# Patient Record
Sex: Female | Born: 1955 | Race: Black or African American | Hispanic: No | Marital: Single | Smoking: Never smoker
Health system: Southern US, Community
[De-identification: ages and names within clinical notes are randomized; demographics above are authoritative.]

## PROBLEM LIST (undated history)

## (undated) DIAGNOSIS — D649 Anemia, unspecified: Secondary | ICD-10-CM

## (undated) DIAGNOSIS — I10 Essential (primary) hypertension: Secondary | ICD-10-CM

## (undated) DIAGNOSIS — M7730 Calcaneal spur, unspecified foot: Secondary | ICD-10-CM

## (undated) DIAGNOSIS — M1990 Unspecified osteoarthritis, unspecified site: Secondary | ICD-10-CM

## (undated) DIAGNOSIS — R011 Cardiac murmur, unspecified: Secondary | ICD-10-CM

## (undated) DIAGNOSIS — I8390 Asymptomatic varicose veins of unspecified lower extremity: Secondary | ICD-10-CM

## (undated) HISTORY — DX: Essential (primary) hypertension: I10

## (undated) HISTORY — DX: Asymptomatic varicose veins of unspecified lower extremity: I83.90

## (undated) HISTORY — PX: FOOT SURGERY: SHX648

## (undated) HISTORY — DX: Unspecified osteoarthritis, unspecified site: M19.90

## (undated) HISTORY — DX: Calcaneal spur, unspecified foot: M77.30

## (undated) HISTORY — PX: ABDOMINAL HYSTERECTOMY: SHX81

---

## 2007-11-18 ENCOUNTER — Ambulatory Visit (HOSPITAL_COMMUNITY): Admission: RE | Admit: 2007-11-18 | Discharge: 2007-11-18 | Payer: Self-pay | Admitting: Family Medicine

## 2010-02-19 ENCOUNTER — Other Ambulatory Visit: Payer: Self-pay

## 2010-02-19 ENCOUNTER — Other Ambulatory Visit (HOSPITAL_COMMUNITY): Payer: Self-pay | Admitting: *Deleted

## 2010-02-19 DIAGNOSIS — Z09 Encounter for follow-up examination after completed treatment for conditions other than malignant neoplasm: Secondary | ICD-10-CM

## 2010-02-26 ENCOUNTER — Ambulatory Visit (HOSPITAL_COMMUNITY)
Admission: RE | Admit: 2010-02-26 | Discharge: 2010-02-26 | Disposition: A | Discharge status: Home / Self Care | Payer: 59 | Source: Ambulatory Visit | Attending: Family Medicine | Admitting: Family Medicine

## 2010-02-26 DIAGNOSIS — Z09 Encounter for follow-up examination after completed treatment for conditions other than malignant neoplasm: Secondary | ICD-10-CM

## 2010-02-26 DIAGNOSIS — N6489 Other specified disorders of breast: Secondary | ICD-10-CM | POA: Insufficient documentation

## 2010-05-08 ENCOUNTER — Other Ambulatory Visit (HOSPITAL_COMMUNITY): Payer: Self-pay | Admitting: Family Medicine

## 2010-05-16 ENCOUNTER — Other Ambulatory Visit (HOSPITAL_COMMUNITY): Payer: Self-pay | Admitting: Family Medicine

## 2010-05-16 DIAGNOSIS — Z09 Encounter for follow-up examination after completed treatment for conditions other than malignant neoplasm: Secondary | ICD-10-CM

## 2011-02-02 ENCOUNTER — Other Ambulatory Visit (HOSPITAL_COMMUNITY): Payer: Self-pay | Admitting: Family Medicine

## 2011-02-02 DIAGNOSIS — Z139 Encounter for screening, unspecified: Secondary | ICD-10-CM

## 2011-03-02 ENCOUNTER — Ambulatory Visit (HOSPITAL_COMMUNITY)
Admission: RE | Admit: 2011-03-02 | Discharge: 2011-03-02 | Disposition: A | Discharge status: Home / Self Care | Payer: 59 | Source: Ambulatory Visit | Attending: Family Medicine | Admitting: Family Medicine

## 2011-03-02 DIAGNOSIS — Z139 Encounter for screening, unspecified: Secondary | ICD-10-CM

## 2011-03-02 DIAGNOSIS — Z1231 Encounter for screening mammogram for malignant neoplasm of breast: Secondary | ICD-10-CM | POA: Insufficient documentation

## 2011-05-20 ENCOUNTER — Ambulatory Visit (INDEPENDENT_AMBULATORY_CARE_PROVIDER_SITE_OTHER): Payer: 59 | Admitting: Family Medicine

## 2011-05-20 ENCOUNTER — Encounter: Payer: Self-pay | Admitting: Family Medicine

## 2011-05-20 VITALS — BP 134/80 | HR 69 | Resp 18 | Ht 68.0 in | Wt 193.0 lb

## 2011-05-20 DIAGNOSIS — Z6825 Body mass index (BMI) 25.0-25.9, adult: Secondary | ICD-10-CM

## 2011-05-20 DIAGNOSIS — I10 Essential (primary) hypertension: Secondary | ICD-10-CM

## 2011-05-20 DIAGNOSIS — M1990 Unspecified osteoarthritis, unspecified site: Secondary | ICD-10-CM

## 2011-05-20 DIAGNOSIS — E669 Obesity, unspecified: Secondary | ICD-10-CM | POA: Insufficient documentation

## 2011-05-20 DIAGNOSIS — I83892 Varicose veins of left lower extremities with other complications: Secondary | ICD-10-CM | POA: Insufficient documentation

## 2011-05-20 DIAGNOSIS — I8390 Asymptomatic varicose veins of unspecified lower extremity: Secondary | ICD-10-CM

## 2011-05-20 DIAGNOSIS — E663 Overweight: Secondary | ICD-10-CM

## 2011-05-20 DIAGNOSIS — I83893 Varicose veins of bilateral lower extremities with other complications: Secondary | ICD-10-CM | POA: Insufficient documentation

## 2011-05-20 MED ORDER — TRAMADOL HCL 50 MG PO TABS: 50.0000 mg | ORAL_TABLET | Freq: Two times a day (BID) | ORAL | Status: DC | PRN

## 2011-05-20 MED ORDER — AMOXICILLIN 500 MG PO CAPS: 500.0000 mg | ORAL_CAPSULE | Freq: Three times a day (TID) | ORAL | Status: DC

## 2011-05-20 MED ORDER — IBUPROFEN 800 MG PO TABS: 800.0000 mg | ORAL_TABLET | Freq: Three times a day (TID) | ORAL | Status: AC | PRN

## 2011-05-20 NOTE — Assessment & Plan Note (Signed)
Continue current meds, obtain records, at goal

## 2011-05-20 NOTE — Progress Notes (Signed)
  Subjective:    Patient ID: Betty Dawson, female    DOB: 02/04/1955, 56 y.o.   MRN: [id number]  HPI Pt here to establish care, previous PCP [facility] Medications and History reviewed [facility] [place] HTN- well controlled on current meds, has been seen by PCP within the past 6 months OA- has severe pain in feet and ankles, has bad varicose veins as well as arthritis in feet. Has had surgery on foot for bone spurs in heels. Takes Advil as needed but this does not help much. Has tried compression hose for her veins but uncomfortable to work in. Tooth pain- has appt with dentist this Friday to have teeth pulled, on antibiotics currently  No colonoscopy,no dexa scan Mammogram UTD     Review of Systems    GEN- denies fatigue, fever, weight loss,weakness, recent illness HEENT- denies eye drainage, change in vision, nasal discharge, CVS- denies chest pain, palpitations, +leg swelling RESP- denies SOB, cough, wheeze ABD- denies N/V, change in stools, abd pain GU- denies dysuria, hematuria, dribbling, incontinence MSK- + joint pain, muscle aches, injury Neuro- denies headache, dizziness, syncope, seizure activity      Objective:   Physical Exam GEN- NAD, alert and oriented x3 HEENT- PERRL, EOMI, non injected sclera, pink conjunctiva, MMM, oropharynx clear Neck- Supple, no thryomegaly CVS- RRR, 2/6 SEM RESP-CTAB ABD-NABS,soft, ND, ND EXT- Trace pedal edema, multiple varicose veins noted Pulses- Radial, DP- 2+        Assessment & Plan:

## 2011-05-20 NOTE — Patient Instructions (Signed)
I will get records from your previous doctor Use the ultram for arthritis pain Continue your other medications Do not use ibuprofen, and advil at the same time F/u 2 months

## 2011-05-20 NOTE — Assessment & Plan Note (Signed)
Trial of ultram 

## 2011-05-27 ENCOUNTER — Telehealth: Payer: Self-pay | Admitting: Family Medicine

## 2011-05-28 MED ORDER — AMLODIPINE BESYLATE 5 MG PO TABS: 5.0000 mg | ORAL_TABLET | Freq: Every day | ORAL | Status: DC

## 2011-05-28 MED ORDER — HYDROCHLOROTHIAZIDE 25 MG PO TABS: 25.0000 mg | ORAL_TABLET | Freq: Every day | ORAL | Status: DC

## 2011-05-28 NOTE — Telephone Encounter (Signed)
meds sent as requested 

## 2011-06-02 ENCOUNTER — Ambulatory Visit (INDEPENDENT_AMBULATORY_CARE_PROVIDER_SITE_OTHER): Payer: 59 | Admitting: Family Medicine

## 2011-06-02 ENCOUNTER — Encounter: Payer: Self-pay | Admitting: Family Medicine

## 2011-06-02 VITALS — BP 128/80 | HR 58 | Resp 18 | Ht 68.0 in | Wt 189.0 lb

## 2011-06-02 DIAGNOSIS — M25579 Pain in unspecified ankle and joints of unspecified foot: Secondary | ICD-10-CM

## 2011-06-02 DIAGNOSIS — G8929 Other chronic pain: Secondary | ICD-10-CM

## 2011-06-02 DIAGNOSIS — M1990 Unspecified osteoarthritis, unspecified site: Secondary | ICD-10-CM

## 2011-06-02 MED ORDER — HYDROCODONE-ACETAMINOPHEN 5-500 MG PO TABS: 1.0000 | ORAL_TABLET | Freq: Four times a day (QID) | ORAL | Status: DC | PRN

## 2011-06-02 NOTE — Assessment & Plan Note (Signed)
Chronic bilat ankle pain, will place in her stabilizing braces, she has seen many specialist before and has had injections and nothing has helped. I think today is worse secondary to her abnormal shift and increased activity,given hydrocodone for severe pain only. On NSAID for tooth

## 2011-06-02 NOTE — Assessment & Plan Note (Signed)
Continue ultram daily for OA

## 2011-06-02 NOTE — Progress Notes (Signed)
  Subjective:    Patient ID: Betty Dawson, female    DOB: 1955/06/22, 56 y.o.   MRN: [id number]  HPI  Patient here secondary to bilateral foot and ankle pain. She had a long shift and was hoping to trying another individual and she was up for 12 hours walking more than normal. Since then she's had increased pain in her ankles and feet bilaterally. She also has some swelling in the ankles. She's had this pain many times before or after too much activity. She tried her ultram but this did not help very much.  Review of Systems     GEN- denies fatigue, fever, weight loss,weakness, recent illness CVS- denies chest pain, palpitations, +leg swelling MSK- + joint pain, muscle aches, injury      Objective:   Physical Exam GEN- NAD, alert and oriented x3 MSK- normal inspection bilat feet and ankles besides varicosities, TTP medial malleous bilat., fair ROM bilat ankles with discomfort,decreased ROM with lateral movements,neg squeeze test, normal thompson test,no calf pain EXT- Trace pedal edema, multiple varicose veins noted Pulses- Radial, DP- 2+ Antaglic gait       Assessment & Plan:

## 2011-06-02 NOTE — Patient Instructions (Addendum)
Where the braces on your feet for the next week , take off if just sitting around Continue the ultram you can take 1-2 tablets ( for arthritis pain) as needed  For severe pain use the vicodin Keep previous F/U appt

## 2011-07-20 ENCOUNTER — Ambulatory Visit (INDEPENDENT_AMBULATORY_CARE_PROVIDER_SITE_OTHER): Payer: 59 | Admitting: Family Medicine

## 2011-07-20 ENCOUNTER — Encounter: Payer: Self-pay | Admitting: Family Medicine

## 2011-07-20 VITALS — BP 130/80 | HR 79 | Resp 16 | Ht 68.0 in | Wt 191.0 lb

## 2011-07-20 DIAGNOSIS — M1990 Unspecified osteoarthritis, unspecified site: Secondary | ICD-10-CM

## 2011-07-20 DIAGNOSIS — Z1211 Encounter for screening for malignant neoplasm of colon: Secondary | ICD-10-CM

## 2011-07-20 DIAGNOSIS — E663 Overweight: Secondary | ICD-10-CM

## 2011-07-20 DIAGNOSIS — Z6825 Body mass index (BMI) 25.0-25.9, adult: Secondary | ICD-10-CM

## 2011-07-20 DIAGNOSIS — R12 Heartburn: Secondary | ICD-10-CM

## 2011-07-20 DIAGNOSIS — R197 Diarrhea, unspecified: Secondary | ICD-10-CM

## 2011-07-20 DIAGNOSIS — I10 Essential (primary) hypertension: Secondary | ICD-10-CM

## 2011-07-20 MED ORDER — OMEPRAZOLE 20 MG PO CPDR: 20.0000 mg | DELAYED_RELEASE_CAPSULE | Freq: Every day | ORAL | Status: DC

## 2011-07-20 NOTE — Patient Instructions (Addendum)
I will refer you for a colonoscopy  Get the blood work done fasting Continue current meds Start the omeprazole for your acid reflux Try to break the vicodin tablet in half  F/U 4 months

## 2011-07-20 NOTE — Progress Notes (Signed)
  Subjective:    Patient ID: Betty Dawson, female    DOB: 03/10/1955, 57 y.o.   MRN: [id number]  HPI Patient presents for routine followup. She continues to have leg pain which is chronic in nature she also has history of arthritis in her hip and has had epidural injections in steroid injections in the past. She tried Lortab however this gave her a headache she continues to use the Ultram. She admits to mild diarrhea for the past 2 days denies fever, nausea, vomiting, blood in stool. She also has had increased reflux with different foods that she eats. She is due for colonoscopy. Also due for blood work.   Review of Systems - per above   GEN- denies fatigue, fever, weight loss,weakness, recent illness HEENT- denies eye drainage, change in vision, nasal discharge, CVS- denies chest pain, palpitations RESP- denies SOB, cough, wheeze ABD- denies N/V, change in stools, abd pain GU- denies dysuria, hematuria, dribbling, incontinence MSK- + joint pain, muscle aches, injury Neuro- denies headache, dizziness, syncope, seizure activity      Objective:   Physical Exam GEN- NAD, alert and oriented x3 HEENT- PERRL, EOMI, non injected sclera, pink conjunctiva, MMM, oropharynx clear Neck-supple CVS- RRR, no murmur RESP-CTAB ABD-NABS,soft,NT,ND,no suprapubic tenderness EXT- Trace pedal edema, multiple varicose veins noted Pulses- Radial, DP- 2+ Antaglic gait        Assessment & Plan:

## 2011-07-21 DIAGNOSIS — R12 Heartburn: Secondary | ICD-10-CM | POA: Insufficient documentation

## 2011-07-21 LAB — COMPREHENSIVE METABOLIC PANEL
BUN: 16 mg/dL (ref 6–23)
CO2: 32 mEq/L (ref 19–32)
Calcium: 9.2 mg/dL (ref 8.4–10.5)
Sodium: 140 mEq/L (ref 135–145)

## 2011-07-21 LAB — CBC WITH DIFFERENTIAL/PLATELET
Basophils Absolute: 0 10*3/uL (ref 0.0–0.1)
Basophils Relative: 0 % (ref 0–1)
Eosinophils Absolute: 0.1 10*3/uL (ref 0.0–0.7)
Eosinophils Relative: 2 % (ref 0–5)
Hemoglobin: 10.6 g/dL — ABNORMAL LOW (ref 12.0–15.0)
MCHC: 32.8 g/dL (ref 30.0–36.0)
MCV: 82.4 fL (ref 78.0–100.0)
Neutro Abs: 4.2 10*3/uL (ref 1.7–7.7)
Platelets: 269 10*3/uL (ref 150–400)
RBC: 3.92 MIL/uL (ref 3.87–5.11)

## 2011-07-21 LAB — LIPID PANEL
Cholesterol: 132 mg/dL (ref 0–200)
LDL Cholesterol: 64 mg/dL (ref 0–99)
VLDL: 9 mg/dL (ref 0–40)

## 2011-07-21 NOTE — Assessment & Plan Note (Signed)
Trial of PPI. 

## 2011-07-21 NOTE — Assessment & Plan Note (Signed)
Continue ultram, try 1/2 tablet of hydrocodone

## 2011-07-21 NOTE — Assessment & Plan Note (Signed)
Acute diarrhea, not ill appearing, supportive care, send for colonoscopy Labs per above

## 2011-07-21 NOTE — Assessment & Plan Note (Signed)
unchanged

## 2011-07-21 NOTE — Assessment & Plan Note (Signed)
Well controlled no change to meds 

## 2011-07-22 ENCOUNTER — Encounter (INDEPENDENT_AMBULATORY_CARE_PROVIDER_SITE_OTHER): Payer: Self-pay | Admitting: *Deleted

## 2011-10-26 ENCOUNTER — Ambulatory Visit (INDEPENDENT_AMBULATORY_CARE_PROVIDER_SITE_OTHER): Payer: 59 | Admitting: Family Medicine

## 2011-10-26 ENCOUNTER — Encounter: Payer: Self-pay | Admitting: Family Medicine

## 2011-10-26 VITALS — BP 148/90 | HR 61 | Resp 15 | Ht 68.0 in | Wt 192.0 lb

## 2011-10-26 DIAGNOSIS — M1990 Unspecified osteoarthritis, unspecified site: Secondary | ICD-10-CM

## 2011-10-26 DIAGNOSIS — I10 Essential (primary) hypertension: Secondary | ICD-10-CM

## 2011-10-26 DIAGNOSIS — I8390 Asymptomatic varicose veins of unspecified lower extremity: Secondary | ICD-10-CM

## 2011-10-26 MED ORDER — HYDROCODONE-ACETAMINOPHEN 5-500 MG PO TABS: 1.0000 | ORAL_TABLET | Freq: Four times a day (QID) | ORAL | Status: DC | PRN

## 2011-10-26 MED ORDER — HYDROCHLOROTHIAZIDE 25 MG PO TABS: 25.0000 mg | ORAL_TABLET | Freq: Every day | ORAL | Status: DC

## 2011-10-26 MED ORDER — TRAMADOL HCL 50 MG PO TABS: 50.0000 mg | ORAL_TABLET | Freq: Two times a day (BID) | ORAL | Status: AC | PRN

## 2011-10-26 MED ORDER — OMEPRAZOLE 20 MG PO CPDR: 20.0000 mg | DELAYED_RELEASE_CAPSULE | Freq: Every day | ORAL | Status: DC

## 2011-10-26 MED ORDER — AMLODIPINE BESYLATE 5 MG PO TABS: 5.0000 mg | ORAL_TABLET | Freq: Every day | ORAL | Status: DC

## 2011-10-26 NOTE — Assessment & Plan Note (Signed)
Restart BP medications

## 2011-10-26 NOTE — Progress Notes (Signed)
  Subjective:    Patient ID: Betty Dawson, female    DOB: 01/23/1956, 57 y.o.   MRN: [id number]  HPI Patient here to follow chronic medical problems. She's concerned about swelling to that she has on her right shin in her left ankle she noticed these the past few weeks. She continues to have increased pain with walking and continues to have swelling at the end of the day even if she is wearing support hose or  stockings. She's not had her blood pressure medication one week. She is unable to a colonoscopy right now because her insurance company told her it'll be $900 out of pocket. She's going to follow with her insurance to see why things not being covered Medications reviewed Declined flu shot  Review of Systems GEN- denies fatigue, fever, weight loss,weakness, recent illness HEENT- denies eye drainage, change in vision, nasal discharge, CVS- denies chest pain, palpitations,+ leg sweelling RESP- denies SOB, cough, wheeze ABD- denies N/V, change in stools, abd pain GU- denies dysuria, hematuria, dribbling, incontinence MSK- +joint pain, muscle aches, injury Neuro- denies headache, dizziness, syncope, seizure activity      Objective:   Physical Exam GEN- NAD, alert and oriented x3 HEENT- PERRL, EOMI, non injected sclera, pink conjunctiva, MMM, oropharynx clear Neck-supple CVS- RRR, no murmur RESP-CTAB ABD-NABS,soft,NT,ND,no suprapubic tenderness EXT- Trace pedal edema, multiple varicose veins noted, varicose vein/?swelling palpated on upper right shin and left medial aspect of foot near ankle Pulses- Radial, DP- 2+          Assessment & Plan:

## 2011-10-26 NOTE — Assessment & Plan Note (Signed)
Continue ultram and vicodin as needed

## 2011-10-26 NOTE — Assessment & Plan Note (Signed)
I believe her pains are worsening and she has a lot of fatigue in her legs secondary to the swelling in the veins. She has been evaluated by vascular a few years ago. I believe with her current symptoms she needs reevaluation for any possible treatment

## 2011-10-26 NOTE — Patient Instructions (Addendum)
Continue compression hose Vascular appt for the legs and veins - appointment for morning Restart the blood pressure pills Let us know if colonoscopy can be covered  F/U 4 months

## 2011-11-02 ENCOUNTER — Other Ambulatory Visit: Payer: Self-pay

## 2011-11-02 DIAGNOSIS — M7989 Other specified soft tissue disorders: Secondary | ICD-10-CM

## 2011-11-27 ENCOUNTER — Encounter: Payer: Self-pay | Admitting: Vascular Surgery

## 2011-11-30 ENCOUNTER — Encounter (INDEPENDENT_AMBULATORY_CARE_PROVIDER_SITE_OTHER): Payer: 59 | Admitting: *Deleted

## 2011-11-30 ENCOUNTER — Ambulatory Visit (INDEPENDENT_AMBULATORY_CARE_PROVIDER_SITE_OTHER): Payer: 59 | Admitting: Vascular Surgery

## 2011-11-30 ENCOUNTER — Encounter: Payer: Self-pay | Admitting: Vascular Surgery

## 2011-11-30 VITALS — BP 170/88 | HR 62 | Resp 18 | Ht 68.0 in | Wt 194.0 lb

## 2011-11-30 DIAGNOSIS — I83893 Varicose veins of bilateral lower extremities with other complications: Secondary | ICD-10-CM

## 2011-11-30 DIAGNOSIS — M7989 Other specified soft tissue disorders: Secondary | ICD-10-CM

## 2011-11-30 DIAGNOSIS — M79609 Pain in unspecified limb: Secondary | ICD-10-CM

## 2011-11-30 DIAGNOSIS — R609 Edema, unspecified: Secondary | ICD-10-CM

## 2011-11-30 NOTE — Progress Notes (Signed)
Subjective:     Patient ID: Betty Dawson, female   DOB: 09/01/1955, 56 y.o.   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 She has had 2 or 3 episodes of spontaneous bleeding from prominent veins in the right ankle area. She has no history of stasis ulcers of DVT but had superficial phlebitis in both lower extremities many years ago. She developed aching throbbing and burning discomfort in both legs particularly in the ankle and calf areas as the day progresses. She is not wear elastic compression stockings and is unable to elevate her legs at work. She does not take pain medication.  Past Medical History  Diagnosis Date  . Hypertension   . Heel spur   . Arthritis     OA multiple joints  . Varicose veins     History  Substance Use Topics  . Smoking status: Never Smoker   . Smokeless tobacco: Never Used  . Alcohol Use: No    Family History  Problem Relation Age of Onset  . Hypertension Mother   . Cancer Mother   . Diabetes Sister   . Hyperlipidemia Sister   . Hypertension Sister   . Diabetes Brother   . Hypertension Brother     No Known Allergies  Current outpatient prescriptions:amLODipine (NORVASC) 5 MG tablet, Take 1 tablet (5 mg total) by mouth daily., Disp: 30 tablet, Rfl: 5;  hydrochlorothiazide (HYDRODIURIL) 25 MG tablet, Take 1 tablet (25 mg total) by mouth daily., Disp: 30 tablet, Rfl: 5;  HYDROcodone-acetaminophen (VICODIN) 5-500 MG per tablet, Take 1 tablet by mouth every 6 (six) hours as needed for pain., Disp: 40 tablet, Rfl: 0 omeprazole (PRILOSEC) 20 MG capsule, Take 1 capsule (20 mg total) by mouth daily., Disp: 30 capsule, Rfl: 5;  traMADol (ULTRAM) 50 MG tablet, Take 1 tablet (50 mg total) by mouth 2 (two) times daily as needed for pain., Disp: 60 tablet, Rfl: 3  BP 170/88  Pulse 62  Resp 18  Ht 5\' 8"  (1.727 m)  Wt 194 lb (87.998 kg)   BMI 29.50 kg/m2  Body mass index is 29.50 kg/(m^2).         Review of Systems denies chest pain but does complain of dyspnea on exertion skin rashes, diffuse arthritis in her ankles and knees, but no lateralizing weakness, aphasia, amaurosis fugax, or syncope     Objective:   Physical Exam blood pressure 170/88 heart rate 60 respirations 18 Gen.-alert and oriented x3 in no apparent distress HEENT normal for age Lungs no rhonchi or wheezing Cardiovascular regular rhythm no murmurs carotid pulses 3+ palpable no bruits audible Abdomen soft nontender no palpable masses Musculoskeletal free of  major deformities Skin clear -no rashes-diffuse spider and reticular veins in both lower extremities on the medial and lateral thighs and medial and lateral calf particularly in the ankle areas noted the medial malleolus Neurologic normal Lower extremities 3+ femoral and dorsalis pedis pulses palpable bilaterally with 1+ edema bilaterally. Right leg has significant bulging varicosities in the medial calf and posterior calf up to the popliteal fossa. There is early hyperpigmentation in the right medial lower leg near the ankle where the previous spontaneous bleeding occurred.  Today I ordered bilateral venous duplex exam which I reviewed and interpreted. Right leg has reflux in the great saphenous vein down to the mid thigh although the caliber is not large. The right small  saphenous vein is a large vein with gross reflux is supplying these varicosities in the calf. Left leg has reflux in certain areas in the superficial venous system of caliber is small and there is no DVT in either leg.       Assessment:     Severe venous insufficiency right leg with gross reflux right small saphenous vein history of spontaneous bleeding on 2-3 occasions. Painful varicosities affecting patient's living and and ability to work    Plan:     #1 long-leg elastic compression stockings 20-30 mm gradient #2 elevate  legs as much is possible-unable to do this during work #3 ibuprofen on daily basis #4 return in 3 months. If no significant improvement would need #1 laser ablation right small saphenous vein with 10-20 stab phlebectomy followed by #2 one core sclerotherapy right ankle where previous bleeding occurred

## 2012-01-03 ENCOUNTER — Emergency Department (HOSPITAL_COMMUNITY): Payer: 59

## 2012-01-03 ENCOUNTER — Emergency Department (HOSPITAL_COMMUNITY)
Admission: EM | Admit: 2012-01-03 | Discharge: 2012-01-03 | Disposition: A | Discharge status: Home / Self Care | Payer: 59 | Attending: Emergency Medicine | Admitting: Emergency Medicine

## 2012-01-03 ENCOUNTER — Encounter (HOSPITAL_COMMUNITY): Payer: Self-pay | Admitting: *Deleted

## 2012-01-03 DIAGNOSIS — Z79899 Other long term (current) drug therapy: Secondary | ICD-10-CM | POA: Insufficient documentation

## 2012-01-03 DIAGNOSIS — R52 Pain, unspecified: Secondary | ICD-10-CM | POA: Insufficient documentation

## 2012-01-03 DIAGNOSIS — J111 Influenza due to unidentified influenza virus with other respiratory manifestations: Secondary | ICD-10-CM

## 2012-01-03 DIAGNOSIS — J159 Unspecified bacterial pneumonia: Secondary | ICD-10-CM | POA: Insufficient documentation

## 2012-01-03 DIAGNOSIS — Z8679 Personal history of other diseases of the circulatory system: Secondary | ICD-10-CM | POA: Insufficient documentation

## 2012-01-03 DIAGNOSIS — Z8739 Personal history of other diseases of the musculoskeletal system and connective tissue: Secondary | ICD-10-CM | POA: Insufficient documentation

## 2012-01-03 DIAGNOSIS — I10 Essential (primary) hypertension: Secondary | ICD-10-CM | POA: Insufficient documentation

## 2012-01-03 DIAGNOSIS — J189 Pneumonia, unspecified organism: Secondary | ICD-10-CM

## 2012-01-03 DIAGNOSIS — R509 Fever, unspecified: Secondary | ICD-10-CM | POA: Insufficient documentation

## 2012-01-03 MED ORDER — LIDOCAINE HCL (PF) 1 % IJ SOLN
INTRAMUSCULAR | Status: AC
  Administered 2012-01-03: 15:00:00 via INTRAMUSCULAR
  Filled 2012-01-03: qty 5

## 2012-01-03 MED ORDER — OSELTAMIVIR PHOSPHATE 75 MG PO CAPS: 75.0000 mg | ORAL_CAPSULE | Freq: Two times a day (BID) | ORAL | Status: DC

## 2012-01-03 MED ORDER — CEFTRIAXONE SODIUM 1 G IJ SOLR
1.0000 g | Freq: Once | INTRAMUSCULAR | Status: AC
Start: 2012-01-03 — End: 2012-01-03
  Administered 2012-01-03: 1 g via INTRAMUSCULAR
  Filled 2012-01-03: qty 10

## 2012-01-03 MED ORDER — LEVOFLOXACIN 750 MG PO TABS: 750.0000 mg | ORAL_TABLET | Freq: Every day | ORAL | Status: DC

## 2012-01-03 MED ORDER — MUCINEX DM MAXIMUM STRENGTH 60-1200 MG PO TB12: 1.0000 | ORAL_TABLET | Freq: Two times a day (BID) | ORAL | Status: DC | PRN

## 2012-01-03 NOTE — ED Notes (Signed)
MD at bedside. 

## 2012-01-03 NOTE — ED Notes (Signed)
Cough, fever, body aches x 3 days

## 2012-01-03 NOTE — ED Provider Notes (Signed)
History   This chart was scribed for Ward Givens, MD by Toya Smothers, ED Scribe. The patient was seen in room APA06/APA06. Patient's care was started at 1258.  CSN: [id number]  Arrival date & time 01/03/12  1258   First MD Initiated Contact with Patient 01/03/12 1319      Chief Complaint  Patient presents with  . Cough  . Generalized Body Aches    HPI  Betty Dawson is a 56 y.o. female seen by Dr. Jeanice Lim, who presents to the Emergency Department complaining  new, constant, moderate, gradually worsening non-productive cough with  fever to 100.9 at work yesterday, with associate chills and generalized body aches that started 2 days ago. Typically healthy, CC represents a moderate deviation from baseline health. Pt does not recall the context of onset, though Symptoms have not been treated PTA. No sore throat, mild rhinorrhea, no nausea or vomiting, diarrhea, constipation, mild  SOB, no chest pain, or wheezing. She has no known exposures to someone else ill.    . Pt denies use of tobacco products, consumption of alcohol, and use of illicit drugs. Pt is currently working at a nursing home.  PCP Dr Jeanice Lim  Past Medical History  Diagnosis Date  . Hypertension   . Heel spur   . Arthritis     OA multiple joints  . Varicose veins     Past Surgical History  Procedure Date  . Foot surgery   . Abdominal hysterectomy     endometriosis    Family History  Problem Relation Age of Onset  . Hypertension Mother   . Cancer Mother   . Diabetes Sister   . Hyperlipidemia Sister   . Hypertension Sister   . Diabetes Brother   . Hypertension Brother     History  Substance Use Topics  . Smoking status: Never Smoker   . Smokeless tobacco: Never Used  . 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  HENT: Negative for sore throat.   Respiratory: Positive for cough.   All other systems reviewed and are  negative.    Allergies  Review of patient's allergies indicates no known allergies.  Home Medications   Current Outpatient Rx  Name  Route  Sig  Dispense  Refill  . AMLODIPINE BESYLATE 5 MG PO TABS   Oral   Take 1 tablet (5 mg total) by mouth daily.   30 tablet   5   . HYDROCHLOROTHIAZIDE 25 MG PO TABS   Oral   Take 1 tablet (25 mg total) by mouth daily.   30 tablet   5   . HYDROCODONE-ACETAMINOPHEN 5-500 MG PO TABS   Oral   Take 1 tablet by mouth every 6 (six) hours as needed for pain.   40 tablet   0   . OMEPRAZOLE 20 MG PO CPDR   Oral   Take 1 capsule (20 mg total) by mouth daily.   30 capsule   5   . TRAMADOL HCL 50 MG PO TABS   Oral   Take 1 tablet (50 mg total) by mouth 2 (two) times daily as needed for pain.   60 tablet   3     BP 144/70  Pulse 90  Temp 99.3 F (37.4 C) (Oral)  Resp 17  Ht 5\' 8"  (1.727 m)  Wt 192 lb (87.091 kg)  BMI 29.19 kg/m2  SpO2 98%  Vital  signs normal    Physical Exam  Nursing note and vitals reviewed. Constitutional: She is oriented to person, place, and time. She appears well-developed and well-nourished.  Non-toxic appearance. She does not appear ill. No distress.       Sneezing and coughing during the exam.  HENT:  Head: Normocephalic and atraumatic.  Right Ear: External ear normal.  Left Ear: External ear normal.  Nose: Nose normal. No mucosal edema or rhinorrhea.  Mouth/Throat: Oropharynx is clear and moist and mucous membranes are normal. No dental abscesses or uvula swelling.  Eyes: Conjunctivae normal and EOM are normal. Pupils are equal, round, and reactive to light.       Diffuse conjunctival injections of both eyes with tearing.  Neck: Normal range of motion and full passive range of motion without pain. Neck supple.  Cardiovascular: Normal rate, regular rhythm and normal heart sounds.  Exam reveals no gallop and no friction rub.   No murmur heard. Pulmonary/Chest: Effort normal and breath sounds normal.  No respiratory distress. She has no wheezes. She has no rhonchi. She has no rales. She exhibits no tenderness and no crepitus.  Abdominal: Soft. Normal appearance and bowel sounds are normal. She exhibits no distension. There is no tenderness. There is no rebound and no guarding.  Musculoskeletal: Normal range of motion. She exhibits no edema and no tenderness.       Moves all extremities well.   Neurological: She is alert and oriented to person, place, and time. She has normal strength. No cranial nerve deficit.  Skin: Skin is warm, dry and intact. No rash noted. No erythema. No pallor.  Psychiatric: She has a normal mood and affect. Her speech is normal and behavior is normal. Her mood appears not anxious.    ED Course  Procedures    Medications  cefTRIAXone (ROCEPHIN) injection 1 g (1 g Intramuscular Given 01/03/12 1440)  lidocaine (XYLOCAINE) 1 % injection (  Intramuscular Given 01/03/12 1440)    DIAGNOSTIC STUDIES: Oxygen Saturation is 98% on room air, normal by my interpretation.    COORDINATION OF CARE: 13:25- Evaluated Pt. Pt is awake, alert, and without distress. Pt is sneezing and coughing during the exam. 15:35- Discussed x-ray results. Pt was agreeable with getting antibiotic injection.  Dg Chest 2 View  01/03/2012  *RADIOLOGY REPORT*  Clinical Data: Cough and fever.  CHEST - 2 VIEW  Comparison: None  Findings: The cardiac silhouette, mediastinal and hilar contours are within normal limits.  There is a right perihilar infiltrate. No pleural effusion.  The bony thorax is intact.  IMPRESSION: Right parahilar infiltrate.   Original Report Authenticated By: Rudie Meyer, M.D.      1. Community acquired pneumonia   2. Influenza-like illness    New Prescriptions   DEXTROMETHORPHAN-GUAIFENESIN (MUCINEX DM MAXIMUM STRENGTH) 60-1200 MG TB12    Take 1 tablet by mouth 2 (two) times daily as needed (cough).   LEVOFLOXACIN (LEVAQUIN) 750 MG TABLET    Take 1 tablet (750 mg total) by  mouth daily.   OSELTAMIVIR (TAMIFLU) 75 MG CAPSULE    Take 1 capsule (75 mg total) by mouth every 12 (twelve) hours.    Plan discharge  Devoria Albe, MD, FACEP    MDM patient has community acquired pneumonia possible influenza-like illness. She can be treated as outpatient, she has good pulse ox on room air. She is not in respiratory distress.     I personally performed the services described in this documentation, which was scribed in my  presence. The recorded information has been reviewed and considered.  Devoria Albe, MD, [facility]                Ward Givens, MD 01/03/12 2224

## 2012-01-08 ENCOUNTER — Ambulatory Visit (INDEPENDENT_AMBULATORY_CARE_PROVIDER_SITE_OTHER): Payer: 59 | Admitting: Family Medicine

## 2012-01-08 ENCOUNTER — Encounter: Payer: Self-pay | Admitting: Family Medicine

## 2012-01-08 VITALS — BP 130/80 | HR 77 | Resp 16 | Ht 68.0 in | Wt 182.0 lb

## 2012-01-08 DIAGNOSIS — J111 Influenza due to unidentified influenza virus with other respiratory manifestations: Secondary | ICD-10-CM

## 2012-01-08 DIAGNOSIS — J189 Pneumonia, unspecified organism: Secondary | ICD-10-CM

## 2012-01-08 DIAGNOSIS — I10 Essential (primary) hypertension: Secondary | ICD-10-CM

## 2012-01-08 NOTE — Progress Notes (Signed)
  Subjective:    Patient ID: Betty Dawson, female    DOB: [birth year], 56 y.o.   MRN: [id number]  HPI   patient here to followup ER visit for pneumonia and influenza. Last week she began having sore throat which progressed to coughing and body aches. She had some shortness of breath while at work therefore is unable to the ER. She chest x-ray showed infiltrate. She was started on Levaquin and Tamiflu. She returned to work yesterday she still has fatigue and some mild productive cough but is improving. Her appetite is also low and she has lost almost 10 pounds. She denies any diarrhea, abdominal pain, fever.  Review of Systems  GEN- denies fatigue, fever, weight loss,weakness, recent illness HEENT- denies eye drainage, change in vision, nasal discharge, CVS- denies chest pain, palpitations RESP- denies SOB, cough, wheeze ABD- denies N/V, change in stools, abd pain GU- denies dysuria, hematuria, dribbling, incontinence MSK- denies joint pain, muscle aches, injury Neuro- denies headache, dizziness, syncope, seizure activity      Objective:   Physical Exam GEN- NAD, alert and oriented x3 HEENT- PERRL, EOMI, non injected sclera, pink conjunctiva, MMM, oropharynx mild injection, TM clear bilat no effusion,  + maxillary sinus tenderness, inflammed turbinates,  Nasal drainage  Neck- Supple, no LAD CVS- RRR, no murmur RESP-CTAB EXT- No edema Pulses- Radial 2+         Assessment & Plan:

## 2012-01-08 NOTE — Assessment & Plan Note (Signed)
Blood pressure much improved on medication

## 2012-01-08 NOTE — Patient Instructions (Signed)
Complete flu medication Rest and drink plenty of fluids  F/U 3 months

## 2012-01-08 NOTE — Assessment & Plan Note (Signed)
She's completed antibiotics. Advise her that her appetite and her fatigue will improve over the next couple weeks. Her cough also improved. Her oxygen sats look good and her exam is reassuring today. If for some reason her symptoms do not improve will obtain repeat chest x-ray as well as CBC with differential

## 2012-01-08 NOTE — Assessment & Plan Note (Signed)
Complete Tamiflu 

## 2012-02-16 ENCOUNTER — Other Ambulatory Visit: Payer: Self-pay | Admitting: Family Medicine

## 2012-02-16 DIAGNOSIS — Z09 Encounter for follow-up examination after completed treatment for conditions other than malignant neoplasm: Secondary | ICD-10-CM

## 2012-02-29 ENCOUNTER — Encounter: Payer: Self-pay | Admitting: Vascular Surgery

## 2012-03-01 ENCOUNTER — Ambulatory Visit (INDEPENDENT_AMBULATORY_CARE_PROVIDER_SITE_OTHER): Payer: 59 | Admitting: Vascular Surgery

## 2012-03-01 ENCOUNTER — Encounter: Payer: Self-pay | Admitting: Vascular Surgery

## 2012-03-01 VITALS — BP 146/81 | HR 72 | Resp 16 | Ht 68.0 in | Wt 170.0 lb

## 2012-03-01 DIAGNOSIS — I83893 Varicose veins of bilateral lower extremities with other complications: Secondary | ICD-10-CM

## 2012-03-01 NOTE — Progress Notes (Signed)
Subjective:     Patient ID: Betty Dawson, female   DOB: 06-Jun-1955, 57 y.o.   MRN: [id number]  HPI this 57 year old female returns for further followup regarding her severe venous insufficiency of the right leg. She has a remote history of vein stripping of the right great saphenous vein. She has a history of superficial thrombophlebitis and bleeding from prominent veins in the right ankle area. She has been trying long-leg elastic compression stockings 20-30 mm gradient for the last 3 months with no improvement in her symptoms which consisted aching throbbing and burning discomfort with edema. She has had no recurrent bleeding in the last 3 months. She has also tried ibuprofen and elevation.  Past Medical History  Diagnosis Date  . Hypertension   . Heel spur   . Arthritis     OA multiple joints  . Varicose veins     History  Substance Use Topics  . Smoking status: Never Smoker   . Smokeless tobacco: Never Used  . Alcohol Use: No    Family History  Problem Relation Age of Onset  . Hypertension Mother   . Cancer Mother   . Diabetes Sister   . Hyperlipidemia Sister   . Hypertension Sister   . Diabetes Brother   . Hypertension Brother     No Known Allergies  Current outpatient prescriptions:amLODipine (NORVASC) 5 MG tablet, Take 1 tablet (5 mg total) by mouth daily., Disp: 30 tablet, Rfl: 5;  Dextromethorphan-Guaifenesin (MUCINEX DM MAXIMUM STRENGTH) 60-1200 MG TB12, Take 1 tablet by mouth 2 (two) times daily as needed (cough)., Disp: 28 each, Rfl: 0;  hydrochlorothiazide (HYDRODIURIL) 25 MG tablet, Take 1 tablet (25 mg total) by mouth daily., Disp: 30 tablet, Rfl: 5 HYDROcodone-acetaminophen (VICODIN) 5-500 MG per tablet, Take 1 tablet by mouth every 6 (six) hours as needed for pain., Disp: 40 tablet, Rfl: 0;  omeprazole (PRILOSEC) 20 MG capsule, Take 1 capsule (20 mg total) by mouth daily., Disp: 30 capsule, Rfl: 5;  oseltamivir (TAMIFLU) 75 MG capsule, Take 1 capsule (75 mg total)  by mouth every 12 (twelve) hours., Disp: 10 capsule, Rfl: 0 traMADol (ULTRAM) 50 MG tablet, Take 1 tablet (50 mg total) by mouth 2 (two) times daily as needed for pain., Disp: 60 tablet, Rfl: 3  BP 146/81  Pulse 72  Resp 16  Ht 5\' 8"  (1.727 m)  Wt 170 lb (77.111 kg)  BMI 25.85 kg/m2  Body mass index is 25.85 kg/(m^2).         Review of Systems denies chest pain, dyspnea on exertion, PND, orthopnea, hemoptysis, claudication     Objective:   Physical Exam blood pressure 146/81 heart rate 70 respirations 16 Well-developed well-nourished female in no apparent stress alert and oriented x3 Lungs no rhonchi or wheezing Right lower extremity with bulging varicosities in the medial thigh but most prominent varicosities in the posterior calf extending down into the medial ankle area where there is extensive reticular veins where the bleeding occurred previously with 1+ chronic edema. Early hyperpigmentation also noted lower third right leg.    Assessment:     Patient with severe venous insufficiency with gross reflux right small saphenous vein with very large vein supplying bulging varicosities in the calf and ankle area-history of bleeding and history of thrombophlebitis-symptoms resistant to conservative measures    Plan:     Patient needs laser ablation right small saphenous vein with greater than 20 stab phlebectomy for painful varicosities to prevent further worsening of skin changes and stabilize  ankle area, prevent further bleeding, and relieve symptoms. Will proceed with precertification to perform this in the near future

## 2012-03-04 ENCOUNTER — Ambulatory Visit (HOSPITAL_COMMUNITY)
Admission: RE | Admit: 2012-03-04 | Discharge: 2012-03-04 | Disposition: A | Discharge status: Home / Self Care | Payer: 59 | Source: Ambulatory Visit | Attending: Family Medicine | Admitting: Family Medicine

## 2012-03-04 DIAGNOSIS — Z09 Encounter for follow-up examination after completed treatment for conditions other than malignant neoplasm: Secondary | ICD-10-CM

## 2012-03-04 DIAGNOSIS — Z1231 Encounter for screening mammogram for malignant neoplasm of breast: Secondary | ICD-10-CM | POA: Insufficient documentation

## 2012-04-08 ENCOUNTER — Ambulatory Visit: Payer: 59 | Admitting: Family Medicine

## 2012-04-12 ENCOUNTER — Ambulatory Visit: Payer: 59 | Admitting: Family Medicine

## 2012-05-30 ENCOUNTER — Other Ambulatory Visit: Payer: Self-pay | Admitting: Family Medicine

## 2012-06-03 ENCOUNTER — Encounter: Payer: Self-pay | Admitting: Family Medicine

## 2012-06-03 ENCOUNTER — Ambulatory Visit (INDEPENDENT_AMBULATORY_CARE_PROVIDER_SITE_OTHER): Payer: 59 | Admitting: Family Medicine

## 2012-06-03 VITALS — BP 142/80 | HR 70 | Resp 18 | Ht 68.0 in | Wt 195.0 lb

## 2012-06-03 DIAGNOSIS — Z6825 Body mass index (BMI) 25.0-25.9, adult: Secondary | ICD-10-CM

## 2012-06-03 DIAGNOSIS — I10 Essential (primary) hypertension: Secondary | ICD-10-CM

## 2012-06-03 DIAGNOSIS — E663 Overweight: Secondary | ICD-10-CM

## 2012-06-03 DIAGNOSIS — M1990 Unspecified osteoarthritis, unspecified site: Secondary | ICD-10-CM

## 2012-06-03 MED ORDER — AMLODIPINE BESYLATE 5 MG PO TABS: ORAL_TABLET | ORAL | Status: DC

## 2012-06-03 MED ORDER — HYDROCHLOROTHIAZIDE 25 MG PO TABS: 25.0000 mg | ORAL_TABLET | Freq: Every day | ORAL | Status: DC

## 2012-06-03 MED ORDER — OMEPRAZOLE 20 MG PO CPDR: 20.0000 mg | DELAYED_RELEASE_CAPSULE | Freq: Every day | ORAL | Status: DC

## 2012-06-03 NOTE — Patient Instructions (Addendum)
Continue current medications Come fasting for lab work at next appointment Check on deductible for colonoscopy Try 1-2 tablets of ultram F/U 3 months [employer]

## 2012-06-05 NOTE — Assessment & Plan Note (Signed)
Given okay to increase ultram to 1-2 tablets as needed She declined hydrocodone makes her feel funny

## 2012-06-05 NOTE — Assessment & Plan Note (Signed)
BP looks okay today, continue current medications

## 2012-06-05 NOTE — Progress Notes (Signed)
  Subjective:    Patient ID: Betty Dawson, female    DOB: 1955/08/19, 57 y.o.   MRN: [id number]  HPI  Pt here to f/u chronic medical problems and for medications. Seen by vascular for varicose veins unable to afford deductible needed for surgery, also had this problem with her colonoscopy. Doing well on BP meds Arthritis continues to give her problems, uses ultram which helps some   Review of Systems   GEN- denies fatigue, fever, weight loss,weakness, recent illness HEENT- denies eye drainage, change in vision, nasal discharge, CVS- denies chest pain, palpitations RESP- denies SOB, cough, wheeze ABD- denies N/V, change in stools, abd pain GU- denies dysuria, hematuria, dribbling, incontinence MSK- + joint pain, muscle aches, injury Neuro- denies headache, dizziness, syncope, seizure activity      Objective:   Physical Exam  GEN- NAD, alert and oriented x3 HEENT- PERRL, EOMI, non injected sclera, pink conjunctiva, MMM, oropharynx clear Neck- Supple, no thryomegaly CVS- RRR, no murmur RESP-CTAB EXT- No edema Pulses- Radial, DP- 2+       Assessment & Plan:

## 2012-06-05 NOTE — Assessment & Plan Note (Signed)
She has gained a significant amount of weight, diet discussed, her exercise is limited

## 2012-11-11 ENCOUNTER — Ambulatory Visit (INDEPENDENT_AMBULATORY_CARE_PROVIDER_SITE_OTHER): Payer: 59 | Admitting: Family Medicine

## 2012-11-11 ENCOUNTER — Encounter: Payer: Self-pay | Admitting: Family Medicine

## 2012-11-11 VITALS — BP 136/80 | HR 78 | Temp 97.0°F | Resp 18 | Wt 200.0 lb

## 2012-11-11 DIAGNOSIS — R12 Heartburn: Secondary | ICD-10-CM

## 2012-11-11 DIAGNOSIS — I10 Essential (primary) hypertension: Secondary | ICD-10-CM

## 2012-11-11 DIAGNOSIS — Z23 Encounter for immunization: Secondary | ICD-10-CM

## 2012-11-11 DIAGNOSIS — I8390 Asymptomatic varicose veins of unspecified lower extremity: Secondary | ICD-10-CM

## 2012-11-11 DIAGNOSIS — M1990 Unspecified osteoarthritis, unspecified site: Secondary | ICD-10-CM

## 2012-11-11 DIAGNOSIS — E669 Obesity, unspecified: Secondary | ICD-10-CM

## 2012-11-11 MED ORDER — AMLODIPINE BESYLATE 5 MG PO TABS: ORAL_TABLET | ORAL | Status: DC

## 2012-11-11 MED ORDER — TRAMADOL HCL 50 MG PO TABS: 100.0000 mg | ORAL_TABLET | Freq: Three times a day (TID) | ORAL | Status: DC | PRN

## 2012-11-11 MED ORDER — HYDROCHLOROTHIAZIDE 25 MG PO TABS: 25.0000 mg | ORAL_TABLET | Freq: Every day | ORAL | Status: DC

## 2012-11-11 NOTE — Progress Notes (Signed)
  Subjective:    Patient ID: Betty Dawson, female    DOB: 01-01-1956, 57 y.o.   MRN: [id number]  HPI  Pt here to f/u chronic medical problems, no specific concerns. Has not had bP meds past 3 days. Meds reviewed, no problems with medications Due for flu shot   Review of Systems  GEN- denies fatigue, fever, weight loss,weakness, recent illness HEENT- denies eye drainage, change in vision, nasal discharge, CVS- denies chest pain, palpitations RESP- denies SOB, cough, wheeze ABD- denies N/V, change in stools, abd pain GU- denies dysuria, hematuria, dribbling, incontinence MSK- + joint pain, muscle aches, injury Neuro- denies headache, dizziness, syncope, seizure activity      Objective:   Physical Exam GEN- NAD, alert and oriented x3 HEENT- PERRL, EOMI, non injected sclera, pink conjunctiva, MMM, oropharynx clear CVS- RRR, no murmur RESP-CTAB EXT- No edema, varicose veins extensive Pulses- Radial, DP- 2+        Assessment & Plan:

## 2012-11-11 NOTE — Patient Instructions (Signed)
Continue current medications Flu shot given  F/U 6 weeks for Physical Exam with labs

## 2012-11-13 NOTE — Assessment & Plan Note (Signed)
Discussed weight gain, change in diet

## 2012-11-13 NOTE — Assessment & Plan Note (Signed)
Well controlled 

## 2012-11-13 NOTE — Assessment & Plan Note (Addendum)
,   on Ultram

## 2012-11-13 NOTE — Assessment & Plan Note (Signed)
Unchanged, continue to cause pain and swelling, has seen vascular nothing can be done at this time She declines any further intervention Continue ultram

## 2012-11-13 NOTE — Assessment & Plan Note (Signed)
PPI as needed 

## 2012-12-23 ENCOUNTER — Encounter: Payer: Self-pay | Admitting: *Deleted

## 2012-12-23 ENCOUNTER — Ambulatory Visit (INDEPENDENT_AMBULATORY_CARE_PROVIDER_SITE_OTHER): Payer: 59 | Admitting: Family Medicine

## 2012-12-23 VITALS — BP 130/80 | HR 68 | Temp 97.8°F | Resp 18 | Ht 68.0 in | Wt 198.5 lb

## 2012-12-23 DIAGNOSIS — Z1211 Encounter for screening for malignant neoplasm of colon: Secondary | ICD-10-CM

## 2012-12-23 DIAGNOSIS — I10 Essential (primary) hypertension: Secondary | ICD-10-CM

## 2012-12-23 LAB — COMPREHENSIVE METABOLIC PANEL
AST: 15 U/L (ref 0–37)
Albumin: 4.1 g/dL (ref 3.5–5.2)
Alkaline Phosphatase: 89 U/L (ref 39–117)
BUN: 17 mg/dL (ref 6–23)
Chloride: 101 mEq/L (ref 96–112)
Creat: 0.59 mg/dL (ref 0.50–1.10)
Glucose, Bld: 98 mg/dL (ref 70–99)
Potassium: 4.2 mEq/L (ref 3.5–5.3)

## 2012-12-23 LAB — CBC WITH DIFFERENTIAL/PLATELET
Basophils Relative: 0 % (ref 0–1)
Eosinophils Absolute: 0.1 10*3/uL (ref 0.0–0.7)
Eosinophils Relative: 2 % (ref 0–5)
HCT: 34 % — ABNORMAL LOW (ref 36.0–46.0)
Hemoglobin: 11.1 g/dL — ABNORMAL LOW (ref 12.0–15.0)
MCH: 27.1 pg (ref 26.0–34.0)
MCHC: 32.6 g/dL (ref 30.0–36.0)
MCV: 82.9 fL (ref 78.0–100.0)
Monocytes Absolute: 0.4 10*3/uL (ref 0.1–1.0)
Monocytes Relative: 7 % (ref 3–12)

## 2012-12-23 LAB — LIPID PANEL
LDL Cholesterol: 58 mg/dL (ref 0–99)
VLDL: 8 mg/dL (ref 0–40)

## 2012-12-23 MED ORDER — TRAMADOL HCL 50 MG PO TABS: 100.0000 mg | ORAL_TABLET | Freq: Three times a day (TID) | ORAL | Status: DC | PRN

## 2012-12-23 NOTE — Patient Instructions (Signed)
Reschedule physical for February We will send letter with lab results

## 2012-12-23 NOTE — Progress Notes (Signed)
   Subjective:    Patient ID: Betty Dawson, female    DOB: [birth year], 57 y.o.   MRN: [id number]  HPI  Issue is here scheduled for physical exam however she wants to defer this. She is status post hysterectomy. She did come fasting to have her labs done. Her exam will be deferred until February at her request Labs will be done. She will be sent for colonoscopy   Review of Systems     Objective:   Physical Exam        Assessment & Plan:

## 2013-01-06 ENCOUNTER — Telehealth: Payer: Self-pay

## 2013-01-06 NOTE — Telephone Encounter (Signed)
Pt was referred by Dr. Jeanice Lim for screening colonoscopy. Pt said she is waiting on her new insurance after the first of the year and she will call me when she is ready to schedule.

## 2013-01-30 ENCOUNTER — Telehealth: Payer: Self-pay

## 2013-01-30 NOTE — Telephone Encounter (Signed)
I called pt and she will not have ner new insurance until around 02/08/2013. She will call me when she gets it.

## 2013-02-08 ENCOUNTER — Other Ambulatory Visit: Payer: Self-pay | Admitting: Family Medicine

## 2013-02-08 DIAGNOSIS — Z139 Encounter for screening, unspecified: Secondary | ICD-10-CM

## 2013-02-09 ENCOUNTER — Encounter (HOSPITAL_COMMUNITY): Payer: Self-pay | Admitting: Pharmacy Technician

## 2013-02-09 ENCOUNTER — Other Ambulatory Visit: Payer: Self-pay

## 2013-02-09 ENCOUNTER — Telehealth: Payer: Self-pay

## 2013-02-09 DIAGNOSIS — Z1211 Encounter for screening for malignant neoplasm of colon: Secondary | ICD-10-CM

## 2013-02-09 MED ORDER — SOD PICOSULFATE-MAG OX-CIT ACD 10-3.5-12 MG-GM-GM PO PACK: 1.0000 | PACK | ORAL | Status: DC

## 2013-02-09 NOTE — Telephone Encounter (Signed)
Gastroenterology Pre-Procedure Review  Request Date: 02/09/2013 Requesting Physician: Dr. Buelah Manis  PATIENT REVIEW QUESTIONS: The patient responded to the following health history questions as indicated:    1. Diabetes Melitis: no 2. Joint replacements in the past 12 months: no 3. Major health problems in the past 3 months: no 4. Has an artificial valve or MVP: no 5. Has a defibrillator: no 6. Has been advised in past to take antibiotics in advance of a procedure like teeth cleaning: no    MEDICATIONS & ALLERGIES:    Patient reports the following regarding taking any blood thinners:   Plavix? no Aspirin? no Coumadin? no  Patient confirms/reports the following medications:  Current Outpatient Prescriptions  Medication Sig Dispense Refill  . amLODipine (NORVASC) 5 MG tablet One tab daily  30 tablet  6  . hydrochlorothiazide (HYDRODIURIL) 25 MG tablet Take 1 tablet (25 mg total) by mouth daily.  30 tablet  5  . omeprazole (PRILOSEC) 20 MG capsule Take 1 capsule (20 mg total) by mouth daily.  30 capsule  5  . traMADol (ULTRAM) 50 MG tablet Take 2 tablets (100 mg total) by mouth every 8 (eight) hours as needed.  60 tablet  3   No current facility-administered medications for this visit.    Patient confirms/reports the following allergies:  No Known Allergies  No orders of the defined types were placed in this encounter.    AUTHORIZATION INFORMATION Primary Insurance:   ID #:   Group #:  Pre-Cert / Auth required:  Pre-Cert / Auth #:   Secondary Insurance:   ID #:   Group #:  Pre-Cert / Auth required:  Pre-Cert / Auth #:   SCHEDULE INFORMATION: Procedure has been scheduled as follows:  Date: 02/21/2013                  Time: 10:30 AM  Location: [facility] Short Stay  This Gastroenterology Pre-Precedure Review Form is being routed to the following provider(s): Barney Drain, MD

## 2013-02-09 NOTE — Telephone Encounter (Signed)
I called [employer] and spoke to [place] B at [phone] who said that a PA is not required for a screening colonoscopy.

## 2013-02-09 NOTE — Telephone Encounter (Signed)
Rx sent to pharmacy and instructions mailed to pt.

## 2013-02-09 NOTE — Telephone Encounter (Signed)
PREPOPIK-DRINK WATER TO KEEP URINE LIGHT YELLOW.  PT SHOULD DROP OFF RX 3 DAYS PRIOR TO PROCEDURE.  

## 2013-02-14 ENCOUNTER — Telehealth: Payer: Self-pay

## 2013-02-14 MED ORDER — PEG 3350-KCL-NA BICARB-NACL 420 G PO SOLR: 4000.0000 mL | ORAL | Status: DC

## 2013-02-14 NOTE — Telephone Encounter (Signed)
REVIEWED. AGREE. 

## 2013-02-14 NOTE — Telephone Encounter (Signed)
Pt came by and the Prepopik was going to cost over $100.00. I called [facility] and Movie prep would cost $80.00 plus. So I sent in the Trilyte prep and gave new instructions.

## 2013-02-21 ENCOUNTER — Ambulatory Visit (HOSPITAL_COMMUNITY)
Admission: RE | Admit: 2013-02-21 | Discharge: 2013-02-21 | Disposition: A | Discharge status: Home / Self Care | Payer: 59 | Source: Ambulatory Visit | Attending: Gastroenterology | Admitting: Gastroenterology

## 2013-02-21 ENCOUNTER — Encounter (HOSPITAL_COMMUNITY): Payer: Self-pay | Admitting: *Deleted

## 2013-02-21 ENCOUNTER — Encounter (HOSPITAL_COMMUNITY)
Admission: RE | Disposition: A | Discharge status: Home / Self Care | Payer: Self-pay | Source: Ambulatory Visit | Attending: Gastroenterology

## 2013-02-21 DIAGNOSIS — I10 Essential (primary) hypertension: Secondary | ICD-10-CM | POA: Insufficient documentation

## 2013-02-21 DIAGNOSIS — Z1211 Encounter for screening for malignant neoplasm of colon: Secondary | ICD-10-CM | POA: Insufficient documentation

## 2013-02-21 DIAGNOSIS — D126 Benign neoplasm of colon, unspecified: Secondary | ICD-10-CM

## 2013-02-21 HISTORY — PX: COLONOSCOPY: SHX5424

## 2013-02-21 HISTORY — PX: COLONOSCOPY: SHX174

## 2013-02-21 SURGERY — COLONOSCOPY
Anesthesia: Moderate Sedation

## 2013-02-21 MED ORDER — MIDAZOLAM HCL 5 MG/5ML IJ SOLN
INTRAMUSCULAR | Status: AC
  Filled 2013-02-21: qty 10

## 2013-02-21 MED ORDER — SODIUM CHLORIDE 0.9 % IV SOLN
INTRAVENOUS | Status: DC
  Administered 2013-02-21: 10:00:00 via INTRAVENOUS

## 2013-02-21 MED ORDER — MEPERIDINE HCL 100 MG/ML IJ SOLN
INTRAMUSCULAR | Status: DC | PRN
  Administered 2013-02-21 (×3): 25 mg via INTRAVENOUS

## 2013-02-21 MED ORDER — STERILE WATER FOR IRRIGATION IR SOLN
Status: DC | PRN
  Administered 2013-02-21: 10:00:00

## 2013-02-21 MED ORDER — MEPERIDINE HCL 100 MG/ML IJ SOLN
INTRAMUSCULAR | Status: AC
  Filled 2013-02-21: qty 2

## 2013-02-21 MED ORDER — MIDAZOLAM HCL 5 MG/5ML IJ SOLN
INTRAMUSCULAR | Status: DC | PRN
  Administered 2013-02-21 (×2): 2 mg via INTRAVENOUS
  Administered 2013-02-21: 1 mg via INTRAVENOUS

## 2013-02-21 NOTE — Discharge Instructions (Signed)
You had 1 polyp removed. YOU HAVE A FLOPPY COLON.   DRINK WATER TO KEEP YOUR URINE LIGHT YELLOW.  FOLLOW A HIGH FIBER DIET. AVOID ITEMS THAT CAUSE BLOATING. SEE INFO BELOW.  YOUR BIOPSY RESULTS SHOULD BE BACK IN 7 DAYS.  Next colonoscopy in 10 years.  Colonoscopy Care After Read the instructions outlined below and refer to this sheet in the next week. These discharge instructions provide you with general information on caring for yourself after you leave the hospital. While your treatment has been planned according to the most current medical practices available, unavoidable complications occasionally occur. If you have any problems or questions after discharge, call DR. Virtie Bungert, [phone].  ACTIVITY  You may resume your regular activity, but move at a slower pace for the next 24 hours.   Take frequent rest periods for the next 24 hours.   Walking will help get rid of the air and reduce the bloated feeling in your belly (abdomen).   No driving for 24 hours (because of the medicine (anesthesia) used during the test).   You may shower.   Do not sign any important legal documents or operate any machinery for 24 hours (because of the anesthesia used during the test).    NUTRITION  Drink plenty of fluids.   You may resume your normal diet as instructed by your doctor.   Begin with a light meal and progress to your normal diet. Heavy or fried foods are harder to digest and may make you feel sick to your stomach (nauseated).   Avoid alcoholic beverages for 24 hours or as instructed.    MEDICATIONS  You may resume your normal medications.   WHAT YOU CAN EXPECT TODAY  Some feelings of bloating in the abdomen.   Passage of more gas than usual.   Spotting of blood in your stool or on the toilet paper  .  IF YOU HAD POLYPS REMOVED DURING THE COLONOSCOPY:  Eat a soft diet IF YOU HAVE NAUSEA, BLOATING, ABDOMINAL PAIN, OR VOMITING.    FINDING OUT THE RESULTS OF YOUR  TEST Not all test results are available during your visit. DR. Oneida Alar WILL CALL YOU WITHIN 7 DAYS OF YOUR PROCEDUE WITH YOUR RESULTS. Do not assume everything is normal if you have not heard from DR. Sylvanna Burggraf IN ONE WEEK, CALL HER OFFICE AT [phone].  SEEK IMMEDIATE MEDICAL ATTENTION AND CALL THE OFFICE: [phone] IF:  You have more than a spotting of blood in your stool.   Your belly is swollen (abdominal distention).   You are nauseated or vomiting.   You have a temperature over 101F.   You have abdominal pain or discomfort that is severe or gets worse throughout the day.   High-Fiber Diet A high-fiber diet changes your normal diet to include more whole grains, legumes, fruits, and vegetables. Changes in the diet involve replacing refined carbohydrates with unrefined foods. The calorie level of the diet is essentially unchanged. The Dietary Reference Intake (recommended amount) for adult males is 38 grams per day. For adult females, it is 25 grams per day. Pregnant and lactating women should consume 28 grams of fiber per day. Fiber is the intact part of a plant that is not broken down during digestion. Functional fiber is fiber that has been isolated from the plant to provide a beneficial effect in the body. PURPOSE  Increase stool bulk.   Ease and regulate bowel movements.   Lower cholesterol.  INDICATIONS THAT YOU NEED MORE FIBER  Constipation  and hemorrhoids.   Uncomplicated diverticulosis (intestine condition) and irritable bowel syndrome.   Weight management.   As a protective measure against hardening of the arteries (atherosclerosis), diabetes, and cancer.   GUIDELINES FOR INCREASING FIBER IN THE DIET  Start adding fiber to the diet slowly. A gradual increase of about 5 more grams (2 slices of whole-wheat bread, 2 servings of most fruits or vegetables, or 1 bowl of high-fiber cereal) per day is best. Too rapid an increase in fiber may result in constipation,  flatulence, and bloating.   Drink enough water and fluids to keep your urine clear or pale yellow. Water, juice, or caffeine-free drinks are recommended. Not drinking enough fluid may cause constipation.   Eat a variety of high-fiber foods rather than one type of fiber.   Try to increase your intake of fiber through using high-fiber foods rather than fiber pills or supplements that contain small amounts of fiber.   The goal is to change the types of food eaten. Do not supplement your present diet with high-fiber foods, but replace foods in your present diet.  INCLUDE A VARIETY OF FIBER SOURCES  Replace refined and processed grains with whole grains, canned fruits with fresh fruits, and incorporate other fiber sources. White rice, white breads, and most bakery goods contain little or no fiber.   Brown whole-grain rice, buckwheat oats, and many fruits and vegetables are all good sources of fiber. These include: broccoli, Brussels sprouts, cabbage, cauliflower, beets, sweet potatoes, white potatoes (skin on), carrots, tomatoes, eggplant, squash, berries, fresh fruits, and dried fruits.   Cereals appear to be the richest source of fiber. Cereal fiber is found in whole grains and bran. Bran is the fiber-rich outer coat of cereal grain, which is largely removed in refining. In whole-grain cereals, the bran remains. In breakfast cereals, the largest amount of fiber is found in those with "bran" in their names. The fiber content is sometimes indicated on the label.   You may need to include additional fruits and vegetables each day.   In baking, for 1 cup white flour, you may use the following substitutions:   1 cup whole-wheat flour minus 2 tablespoons.   1/2 cup white flour plus 1/2 cup whole-wheat flour.   Polyps, Colon  A polyp is extra tissue that grows inside your body. Colon polyps grow in the large intestine. The large intestine, also called the colon, is part of your digestive system. It  is a long, hollow tube at the end of your digestive tract where your body makes and stores stool. Most polyps are not dangerous. They are benign. This means they are not cancerous. But over time, some types of polyps can turn into cancer. Polyps that are smaller than a pea are usually not harmful. But larger polyps could someday become or may already be cancerous. To be safe, doctors remove all polyps and test them.   WHO GETS POLYPS? Anyone can get polyps, but certain people are more likely than others. You may have a greater chance of getting polyps if:  You are over 50.   You have had polyps before.   Someone in your family has had polyps.   Someone in your family has had cancer of the large intestine.   Find out if someone in your family has had polyps. You may also be more likely to get polyps if you:   Eat a lot of fatty foods   Smoke   Drink alcohol   Do not  exercise  Eat too much   TREATMENT  The caregiver will remove the polyp during sigmoidoscopy or colonoscopy.    PREVENTION There is not one sure way to prevent polyps. You might be able to lower your risk of getting them if you:  Eat more fruits and vegetables and less fatty food.   Do not smoke.   Avoid alcohol.   Exercise every day.   Lose weight if you are overweight.   Eating more calcium and folate can also lower your risk of getting polyps. Some foods that are rich in calcium are milk, cheese, and broccoli. Some foods that are rich in folate are chickpeas, kidney beans, and spinach.

## 2013-02-21 NOTE — Op Note (Signed)
[facility] [address] [place], [address]   COLONOSCOPY PROCEDURE REPORT  PATIENT: Betty Dawson, Betty Dawson  MR#: [id number] BIRTHDATE: [birth year] , 46  yrs. old GENDER: Female ENDOSCOPIST: Barney Drain, MD REFERRED BY: PROCEDURE DATE:  02/21/2013 PROCEDURE:   Colonoscopy with snare polypectomy INDICATIONS:Average risk patient for colon cancer. MEDICATIONS: Demerol 75 mg IV and Versed 5 mg IV  DESCRIPTION OF PROCEDURE:    Physical exam was performed.  Informed consent was obtained from the patient after explaining the benefits, risks, and alternatives to procedure.  The patient was connected to monitor and placed in left lateral position. Continuous oxygen was provided by nasal cannula and IV medicine administered through an indwelling cannula.  After administration of sedation and rectal exam, the patients rectum was intubated and the EC-3890Li ([id number]), EC-3890Li ([id number]), and EG-2990i ([id number])  colonoscope was advanced under direct visualization to the cecum.  The scope was removed slowly by carefully examining the color, texture, anatomy, and integrity mucosa on the way out.  The patient was recovered in endoscopy and discharged home in satisfactory condition.       COLON FINDINGS: A single polyp measuring 6 mm in size was found in the distal transverse colon.  A polypectomy was performed using snare cautery.  , The colon was redundant.  Manual abdominal counter-pressure was used to reach the cecum.  The patient was moved on to their back to reach the cecum, The colon mucosa was otherwise normal.  , and The mucosa appeared normal in the terminal ileum.  PREP QUALITY: good. CECAL W/D TIME: 18 minutes  COMPLICATIONS: None  ENDOSCOPIC IMPRESSION: 1.   Single polyp measuring 6 mm in size was found in the distal transverse colon; polypectomy was performed using snare cautery 2.   The colon was redundant 3.   The colon mucosa was otherwise normal 4.    Normal mucosa in the terminal ileum   RECOMMENDATIONS: DRINK WATER TO KEEP YOUR URINE LIGHT YELLOW. FOLLOW A HIGH FIBER DIET.  AVOID ITEMS THAT CAUSE BLOATING.  SEE INFO BELOW. BIOPSY RESULTS SHOULD BE BACK IN 7 DAYS.  Next colonoscopy in 10 years with overtube.       _______________________________ eSigned:  Barney Drain, MD 02/21/2013 2:46 PM     PATIENT NAME:  Betty Dawson, Betty Dawson MR#: [id number]

## 2013-02-21 NOTE — Progress Notes (Signed)
Refuses po fluids.

## 2013-02-21 NOTE — H&P (Signed)
  Primary Care Physician:  Vic Blackbird, MD Primary Gastroenterologist:  Dr. Oneida Alar  Pre-Procedure History & Physical: HPI:  Betty Dawson is a 58 y.o. female here for [place].  Past Medical History  Diagnosis Date  . Hypertension   . Heel spur   . Arthritis     OA multiple joints  . Varicose veins     Past Surgical History  Procedure Laterality Date  . Foot surgery    . Abdominal hysterectomy      endometriosis    Prior to Admission medications   Medication Sig Start Date End Date Taking? Authorizing Provider  amLODipine (NORVASC) 5 MG tablet One tab daily 11/11/12  Yes Alycia Rossetti, MD  hydrochlorothiazide (HYDRODIURIL) 25 MG tablet Take 1 tablet (25 mg total) by mouth daily. 11/11/12  Yes Alycia Rossetti, MD  omeprazole (PRILOSEC) 20 MG capsule Take 1 capsule (20 mg total) by mouth daily. 06/03/12 06/03/13 Yes Alycia Rossetti, MD  polyethylene glycol-electrolytes (TRILYTE) 420 G solution Take 4,000 mLs by mouth as directed. 02/14/13  Yes Danie Binder, MD  traMADol (ULTRAM) 50 MG tablet Take 2 tablets (100 mg total) by mouth every 8 (eight) hours as needed. 12/23/12  Yes Alycia Rossetti, MD  Sod Picosulfate-Mag Ox-Cit Acd 10-3.5-12 MG-GM-GM PACK Take 1 Container by mouth as directed. 02/09/13   Danie Binder, MD    Allergies as of 02/09/2013  . (No Known Allergies)    Family History  Problem Relation Age of Onset  . Hypertension Mother   . Cancer Mother   . Diabetes Sister   . Hyperlipidemia Sister   . Hypertension Sister   . Diabetes Brother   . Hypertension Brother     History   Social History  . Marital Status: Single    Spouse Name: N/A    Number of Children: N/A  . Years of Education: N/A   Occupational History  . Not on file.   Social History Main Topics  . Smoking status: Never Smoker   . Smokeless tobacco: Never Used  . Alcohol Use: No  . Drug Use: No  . Sexual Activity: Not on file   Other Topics Concern  . Not on file    Social History Narrative  . No narrative on file    Review of Systems: See HPI, otherwise negative ROS   Physical Exam: BP 131/77  Pulse 85  Temp(Src) 98.1 F (36.7 C) (Oral)  Resp 16  Ht 5\' 8"  (1.727 m)  Wt 198 lb (89.812 kg)  BMI 30.11 kg/m2  SpO2 98% General:   Alert,  pleasant and cooperative in NAD Head:  Normocephalic and atraumatic. Neck:  Supple; Lungs:  Clear throughout to auscultation.    Heart:  Regular rate and rhythm. Abdomen:  Soft, nontender and nondistended. Normal bowel sounds, without guarding, and without rebound.   Neurologic:  Alert and  oriented x4;  grossly normal neurologically.  Impression/Plan:    SCREENING  Plan:  1. TCS TODAY

## 2013-02-24 ENCOUNTER — Encounter (HOSPITAL_COMMUNITY): Payer: Self-pay | Admitting: Gastroenterology

## 2013-03-02 ENCOUNTER — Telehealth: Payer: Self-pay | Admitting: Gastroenterology

## 2013-03-02 NOTE — Telephone Encounter (Signed)
Please call pt. 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    DRINK WATER TO KEEP YOUR URINE LIGHT YELLOW.  FOLLOW A HIGH FIBER DIET. AVOID ITEMS THAT CAUSE BLOATING.   Next colonoscopy in 5 years BECAUSE SHE HAD A SERRATED POLYP REMOVED.

## 2013-03-02 NOTE — Telephone Encounter (Signed)
Called and informed pt.  

## 2013-03-06 ENCOUNTER — Ambulatory Visit (HOSPITAL_COMMUNITY)
Admission: RE | Admit: 2013-03-06 | Discharge: 2013-03-06 | Disposition: A | Discharge status: Home / Self Care | Payer: 59 | Source: Ambulatory Visit | Attending: Family Medicine | Admitting: Family Medicine

## 2013-03-06 DIAGNOSIS — Z1231 Encounter for screening mammogram for malignant neoplasm of breast: Secondary | ICD-10-CM | POA: Insufficient documentation

## 2013-03-06 DIAGNOSIS — Z139 Encounter for screening, unspecified: Secondary | ICD-10-CM

## 2013-03-06 NOTE — Telephone Encounter (Signed)
Reminder in EPIC 

## 2013-03-20 ENCOUNTER — Other Ambulatory Visit: Payer: Self-pay | Admitting: Family Medicine

## 2013-03-20 DIAGNOSIS — R928 Other abnormal and inconclusive findings on diagnostic imaging of breast: Secondary | ICD-10-CM

## 2013-03-28 ENCOUNTER — Ambulatory Visit (INDEPENDENT_AMBULATORY_CARE_PROVIDER_SITE_OTHER): Payer: 59 | Admitting: Family Medicine

## 2013-03-28 ENCOUNTER — Encounter: Payer: Self-pay | Admitting: Family Medicine

## 2013-03-28 VITALS — BP 130/84 | HR 64 | Temp 97.8°F | Resp 18 | Ht 65.0 in | Wt 193.0 lb

## 2013-03-28 DIAGNOSIS — I10 Essential (primary) hypertension: Secondary | ICD-10-CM

## 2013-03-28 DIAGNOSIS — E669 Obesity, unspecified: Secondary | ICD-10-CM

## 2013-03-28 DIAGNOSIS — Z23 Encounter for immunization: Secondary | ICD-10-CM

## 2013-03-28 DIAGNOSIS — M25519 Pain in unspecified shoulder: Secondary | ICD-10-CM

## 2013-03-28 DIAGNOSIS — M25511 Pain in right shoulder: Secondary | ICD-10-CM

## 2013-03-28 DIAGNOSIS — Z0000 Encounter for general adult medical examination without abnormal findings: Secondary | ICD-10-CM

## 2013-03-28 MED ORDER — MELOXICAM 7.5 MG PO TABS: 7.5000 mg | ORAL_TABLET | Freq: Every day | ORAL | Status: DC

## 2013-03-28 NOTE — Patient Instructions (Signed)
Start anti-inflammatory medication Get the xray Appt to be made with orthopedics Continue all other medications Tetanus booster given F/U 4 months

## 2013-03-28 NOTE — Addendum Note (Signed)
Addended by: Sheral Flow on: 03/28/2013 11:40 AM   Modules accepted: Orders

## 2013-03-28 NOTE — Assessment & Plan Note (Signed)
Her weight is down 5 pounds continue to work on weight loss and healthy eating

## 2013-03-28 NOTE — Progress Notes (Signed)
Patient ID: Betty Dawson, female   DOB: [birth year], 58 y.o.   MRN: [id number]   Subjective:    Patient ID: Betty Dawson, female    DOB: [birth year], 58 y.o.   MRN: [id number]  Patient presents for CPE. She is status post hysterectomy for endometriosis therefore does not require Pap smear. Her mammogram is up-to-date- though she does have repeat imaging on the right side due to abnormalities seen on the screening. This is set up for tomorrow. 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 Her immunizations are up-to-date with the exception of a tetanus booster. She had recent fasting labs which were reviewed at the bedside She does complain of right shoulder pain for the past 2 months. 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 She does not remember any specific injury but she does remember after the shoulder were started hurting she slid on a throw rug at home and hit her shoulder into the door frame. She's not had any imaging of the shoulder. She has pain with lifting her arm above her head. She denies any tingling or numbness into her fingers. She's been taking her regular tramadol with some relief.    Review Of Systems:  GEN- denies fatigue, fever, weight loss,weakness, recent illness HEENT- denies eye drainage, change in vision, nasal discharge, CVS- denies chest pain, palpitations RESP- denies SOB, cough, wheeze ABD- denies N/V, change in stools, abd pain GU- denies dysuria, hematuria, dribbling, incontinence MSK- + joint pain, muscle aches, injury Neuro- denies headache, dizziness, syncope, seizure activity       Objective:    BP 130/84  Pulse 64  Temp(Src) 97.8 F (36.6 C)  Resp 18  Ht 5\' 5"  (1.651 m)  Wt 193 lb (87.544 kg)  BMI 32.12 kg/m2 GEN- NAD, alert and oriented x3 HEENT- PERRL, EOMI, non injected sclera, pink conjunctiva, MMM, oropharynx clear Neck- Supple,  no thyromegaly, no LAD CVS- RRR, no murmur RESP-CTAB ABD-NABS,soft,NT,ND EXT- No edema MSK- Right shoulder- Decreased ROM, +empty can, neg impingment signs, strength decreased compared to left UE Neuro- normal tone bilat, sensation in tact UE Pulses- Radial, DP- 2+ GU- Deferred        Assessment & Plan:      Problem List Items Addressed This Visit   None    Visit Diagnoses   Routine general medical examination at a health care facility    -  Primary    CPE done, no PAP, preventative care UTD, TDAP given, Mammo right breast tomorrow    Shoulder pain, right        Relevant Orders       DG Shoulder Right       Note: This dictation was prepared with Dragon dictation along with smaller phrase technology. Any transcriptional errors that result from this process are unintentional.

## 2013-03-28 NOTE — Assessment & Plan Note (Signed)
Blood pressure well controlled continue current medication 

## 2013-03-28 NOTE — Assessment & Plan Note (Signed)
I'm concerned for possible rotator cuff syndrome. She also likely has some osteoarthritis that she does have generalized arthritis. I will send her for an x-ray the shoulder and she did have some trauma. I've given her meloxicam and I will set her up with orthopedics for further evaluation.

## 2013-03-29 ENCOUNTER — Encounter (HOSPITAL_COMMUNITY): Payer: 59

## 2013-03-30 ENCOUNTER — Encounter: Payer: Self-pay | Admitting: *Deleted

## 2013-04-04 ENCOUNTER — Ambulatory Visit (HOSPITAL_COMMUNITY)
Admission: RE | Admit: 2013-04-04 | Discharge: 2013-04-04 | Disposition: A | Discharge status: Home / Self Care | Payer: 59 | Source: Ambulatory Visit | Attending: Family Medicine | Admitting: Family Medicine

## 2013-04-04 DIAGNOSIS — M25511 Pain in right shoulder: Secondary | ICD-10-CM

## 2013-04-04 DIAGNOSIS — M25519 Pain in unspecified shoulder: Secondary | ICD-10-CM | POA: Insufficient documentation

## 2013-04-04 DIAGNOSIS — W19XXXA Unspecified fall, initial encounter: Secondary | ICD-10-CM | POA: Insufficient documentation

## 2013-04-12 ENCOUNTER — Ambulatory Visit (HOSPITAL_COMMUNITY)
Admission: RE | Admit: 2013-04-12 | Discharge: 2013-04-12 | Disposition: A | Discharge status: Home / Self Care | Payer: 59 | Source: Ambulatory Visit | Attending: Family Medicine | Admitting: Family Medicine

## 2013-04-12 ENCOUNTER — Other Ambulatory Visit: Payer: Self-pay | Admitting: Family Medicine

## 2013-04-12 DIAGNOSIS — R928 Other abnormal and inconclusive findings on diagnostic imaging of breast: Secondary | ICD-10-CM

## 2013-04-13 ENCOUNTER — Ambulatory Visit: Payer: 59 | Admitting: Orthopedic Surgery

## 2013-04-25 ENCOUNTER — Ambulatory Visit (INDEPENDENT_AMBULATORY_CARE_PROVIDER_SITE_OTHER): Payer: 59

## 2013-04-25 ENCOUNTER — Ambulatory Visit (INDEPENDENT_AMBULATORY_CARE_PROVIDER_SITE_OTHER): Payer: 59 | Admitting: Orthopedic Surgery

## 2013-04-25 VITALS — BP 147/81 | Ht 68.0 in | Wt 194.0 lb

## 2013-04-25 DIAGNOSIS — M719 Bursopathy, unspecified: Secondary | ICD-10-CM

## 2013-04-25 DIAGNOSIS — M542 Cervicalgia: Secondary | ICD-10-CM

## 2013-04-25 DIAGNOSIS — M67919 Unspecified disorder of synovium and tendon, unspecified shoulder: Secondary | ICD-10-CM

## 2013-04-25 DIAGNOSIS — M75101 Unspecified rotator cuff tear or rupture of right shoulder, not specified as traumatic: Secondary | ICD-10-CM

## 2013-04-25 NOTE — Progress Notes (Signed)
Patient ID: Betty Dawson, female   DOB: 27-Oct-1955, 58 y.o.   MRN: [id number]  Chief Complaint  Patient presents with  . Shoulder Pain    Right shoulder pain between top of shoulder to the elbow, hurts to raise arm, and pulling in neck.   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 Symptoms increased gradually over time to include throbbing 9/10 pain which has become constant. It is worse after she's doing some work lifting her arm up trying to get her clothes on and she reports locking and catching  She has a history on her grip systems of heart murmurs and joint pain and stiffness otherwise normal  She is allergic to lisinopril and calcium channel blockers    Past Medical History  Diagnosis Date  . Hypertension   . Heel spur   . Arthritis     OA multiple joints  . Varicose veins     Past Surgical History  Procedure Laterality Date  . Foot surgery    . Abdominal hysterectomy      endometriosis  . Colonoscopy  02/21/2013    WUJ:WJXBJY polyp measuring 6 mm in size was found in the distal/transverse colon; polypectomy was performed using snare cautery/The colon was redundant/The colon mucosa was otherwise normal/Normal mucosa in the terminal ileum  . Colonoscopy N/A 02/21/2013    Procedure: COLONOSCOPY;  Surgeon: Danie Binder, MD;  [facility];  Service: Endoscopy;  Laterality: N/A;  10:30 AM    The past, family history and social history have been reviewed and are recorded in the corresponding sections of epic   Vital signs:   General the patient is well-developed and well-nourished grooming and hygiene are normal Oriented x3 Mood and affect normal Ambulation normal  Inspection of the cervical spine reveals no tenderness some mild pain with full neck extension but otherwise normal range of motion. No muscle tension around the cervical spine skin clean dry and  intact  Left shoulder Full range of motion All joints are stable Motor exam is normal Skin clean dry and intact  Right shoulder Active range of motion Limited 120 passive 150 with pain and positive impingement sign Neer criteria. Stability of the joint confirmed. Muscle strength rotator cuff 5 over 5. No evidence of external rotation contracture. Skin clean dry and intact Reflexes normal bilaterally  Cardiovascular exam is normal Sensory exam normal  Cervical spine view normal  Shoulder x-ray taken at the hospital shows rotator cuff disease: Sclerosis undersurface acromion, cyst formation and sclerosis greater tuberosity.  Rotator cuff syndrome is the primary diagnosis  Recommend subacromial injection Physical therapy Continue meloxicam and tramadol Follow up as needed

## 2013-04-25 NOTE — Patient Instructions (Signed)
  Call [facility] to arrange therapy   Joint Injection  Care After  Refer to this sheet in the next few days. These instructions provide you with information on caring for yourself after you have had a joint injection. Your caregiver also may give you more specific instructions. Your treatment has been planned according to current medical practices, but problems sometimes occur. Call your caregiver if you have any problems or questions after your procedure.  After any type of joint injection, it is not uncommon to experience:  Soreness, swelling, or bruising around the injection site.  Mild numbness, tingling, or weakness around the injection site caused by the numbing medicine used before or with the injection. It also is possible to experience the following effects associated with the specific agent after injection:  Iodine-based contrast agents:  Allergic reaction (itching, hives, widespread redness, and swelling beyond the injection site).  Corticosteroids (These effects are rare.):  Allergic reaction.  Increased blood sugar levels (If you have diabetes and you notice that your blood sugar levels have increased, notify your caregiver).  Increased blood pressure levels.  Mood swings.  Hyaluronic acid in the use of viscosupplementation.  Temporary heat or redness.  Temporary rash and itching.  Increased fluid accumulation in the injected joint. These effects all should resolve within a day after your procedure.  HOME CARE INSTRUCTIONS  Limit yourself to light activity the day of your procedure. Avoid lifting heavy objects, bending, stooping, or twisting.  Take prescription or over-the-counter pain medication as directed by your caregiver.  You may apply ice to your injection site to reduce pain and swelling the day of your procedure. Ice may be applied 3-4 times:  Put ice in a plastic bag.  Place a towel between your skin and the bag.  Leave the ice on for no longer than 15-20  minutes each time. SEEK IMMEDIATE MEDICAL CARE IF:  Pain and swelling get worse rather than better or extend beyond the injection site.  Numbness does not go away.  Blood or fluid continues to leak from the injection site.  You have chest pain.  You have swelling of your face or tongue.  You have trouble breathing or you become dizzy.  You develop a fever, chills, or severe tenderness at the injection site that last longer than 1 day. MAKE SURE YOU:  Understand these instructions.  Watch your condition.  Get help right away if you are not doing well or if you get worse. Document Released: 09/25/2010 Document Revised: 04/06/2011 Document Reviewed: 09/25/2010  [facility] Patient Information 2014 [place].

## 2013-05-17 ENCOUNTER — Ambulatory Visit (HOSPITAL_COMMUNITY)
Admission: RE | Admit: 2013-05-17 | Discharge: 2013-05-17 | Disposition: A | Discharge status: Home / Self Care | Payer: 59 | Source: Ambulatory Visit | Attending: Orthopedic Surgery | Admitting: Orthopedic Surgery

## 2013-05-17 DIAGNOSIS — M6281 Muscle weakness (generalized): Secondary | ICD-10-CM

## 2013-05-17 DIAGNOSIS — I10 Essential (primary) hypertension: Secondary | ICD-10-CM | POA: Insufficient documentation

## 2013-05-17 DIAGNOSIS — IMO0001 Reserved for inherently not codable concepts without codable children: Secondary | ICD-10-CM | POA: Insufficient documentation

## 2013-05-17 DIAGNOSIS — M75100 Unspecified rotator cuff tear or rupture of unspecified shoulder, not specified as traumatic: Secondary | ICD-10-CM

## 2013-05-17 DIAGNOSIS — M25519 Pain in unspecified shoulder: Secondary | ICD-10-CM | POA: Insufficient documentation

## 2013-05-17 NOTE — Evaluation (Addendum)
Occupational Therapy Evaluation  Patient Details  Name: Betty Dawson MRN: [id number] Date of Birth: 06-Jan-1956  Today's Date: 05/17/2013 Time: 1016-1100 OT Time Calculation (min): 44 min OT eval 1016-1100 44'  Visit#: 1 of 12  Re-eval: 06/14/13  Assessment Diagnosis: Right rotator cuff syndrome Next MD Visit: None  Authorization:    Authorization Time Period:    Authorization Visit#:   of     Past Medical History:  Past Medical History  Diagnosis Date  . Hypertension   . Heel spur   . Arthritis     OA multiple joints  . Varicose veins    Past Surgical History:  Past Surgical History  Procedure Laterality Date  . Foot surgery    . Abdominal hysterectomy      endometriosis  . Colonoscopy  02/21/2013    WNI:OEVOJJ polyp measuring 6 mm in size was found in the distal/transverse colon; polypectomy was performed using snare cautery/The colon was redundant/The colon mucosa was otherwise normal/Normal mucosa in the terminal ileum  . Colonoscopy N/A 02/21/2013    Procedure: COLONOSCOPY;  Surgeon: Danie Binder, MD;  Location: [facility];  Service: Endoscopy;  Laterality: N/A;  10:30 AM    Subjective Symptoms/Limitations Symptoms: S: I have to help my arm up a lot with my left hand.  Pertinent History: Ms. Kesinger bgan to experience pain and decreased mobility in her right shoulder approx. 3 months ago. Patient received a cortizone shot from Dr. Aline Brochure 1 month ago and it did not help much with the pain.Dr. Aline Brochure has referred paient to occupational therapy for eval and treat.  Limitations: putting her shirt on, reachin up overhead, lifitng heavy items, sliding patient's up in bed. Special Tests: FOTO score: 53/100 Patient Stated Goals: To be able to do her job without pain and difficulty. 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 IR/ER adducted) RUE AROM (degrees) Right Shoulder Flexion: 125 Degrees Right Shoulder ABduction: 134 Degrees Right Shoulder Internal Rotation: 80 Degrees Right Shoulder External Rotation: 40 Degrees RUE Strength Right Shoulder Flexion: 3+/5 Right Shoulder ABduction: 3+/5 Right Shoulder Internal Rotation: 3+/5 Right Shoulder External Rotation: 3+/5 LUE Assessment LUE Assessment: Within Functional Limits Palpation Palpation: Max fascial restrctions in right upper arm, trapezius, and scapularis region.       Occupational Therapy Assessment and Plan OT Assessment and Plan Clinical Impression Statement: A: Patient is a 58 y/o female s/p right rotator cuff syndrome causing pain, decreasing joint mobility, strength, and fascial restrictions causing difficulty completing B/IADL, work, and leisure activities.  Pt will benefit from skilled therapeutic intervention in order to improve on the following deficits: Impaired UE functional use;Increased fascial restricitons;Decreased range of motion;Pain;Decreased  strength Rehab Potential: Excellent OT Frequency: Min 2X/week OT Duration: 6 weeks OT Treatment/Interventions: Therapeutic activities;Therapeutic exercise;Modalities;Manual therapy;DME and/or AE instruction;Patient/family education OT Plan: P: Pt will benefit from skilled OT services to decrease pain, improve ROM, increase strength, decrease fascial restrictions, and improve overall  ADL status.  Treatment Plan: MFR, manual stretching, PROM, AAROM, AROM, proximal strengthening, scapular stabilization     Goals Short Term Goals Time to Complete Short Term Goals: 3 weeks Short Term Goal 1: Patient will be educated on HEP.  Short Term Goal 2: Patient will increase AROM to [facility] to increase ability to reach up overhead with right arm.  Short Term Goal 3: Patient will increase Right shoulder strength to 4-/5 to increase ability to slide patient's up in bed.  Short Term Goal 4: Patient will report a pain score of 6/10 when completing daily activities.  Short Term Goal 5: Patient will decrease fascial restrictions in right shoulder to moderate amount.  Long Term Goals Time to Complete Long Term Goals: 6 weeks Long Term Goal 1: Patient will return to highest level of independence with all BADL, IADL, work and leisure activites.  Long Term Goal 2: Patient will increase right shoulder and scapular stability to be able to hold arm up overhead for 2 minutes or more during work tasks. Long Term Goal 3: Patient will increase Right shoulder strength to 4+/5 to increase ability to slide patient's up in bed.  Long Term Goal 4: Patient will report a pain score of 3/10 when completing daily activities.  Long Term Goal 5: Patient will decrease fascial restrictions in right shoulder to minimal amount.   Problem List Patient Active Problem List   Diagnosis Date Noted  . Muscle weakness (generalized) 05/17/2013  . Rotator cuff syndrome 05/17/2013  . Right shoulder pain 03/28/2013  . Varicose veins of lower  extremities with other complications 70/78/6754  . Heartburn 07/21/2011  . Ankle pain, chronic 06/02/2011  . HTN (hypertension) 05/20/2011  . OA (osteoarthritis) 05/20/2011  . Varicose veins 05/20/2011  . Obesity (BMI 30-39.9) 05/20/2011    End of Session Activity Tolerance: Patient tolerated treatment well General Behavior During Therapy: [facility] for tasks assessed/performed OT Plan of Care OT Home Exercise Plan: shoulder stretches OT Patient Instructions: handout (scanned) Consulted and Agree with Plan of Care: Patient   Ailene Ravel, OTR/L,CBIS   05/17/2013, 11:44 AM  Physician Documentation Your signature is required to indicate approval of the treatment plan as stated above.  Please sign and either send electronically or make a copy of this report for your files and return this physician signed original.  Please mark one 1.__approve of plan  2. ___approve of plan with the following conditions.   ______________________________                                                          _____________________ Physician Signature  Date  

## 2013-05-24 ENCOUNTER — Ambulatory Visit (HOSPITAL_COMMUNITY)
Admission: RE | Admit: 2013-05-24 | Discharge: 2013-05-24 | Disposition: A | Discharge status: Home / Self Care | Payer: 59 | Source: Ambulatory Visit | Attending: Orthopedic Surgery | Admitting: Orthopedic Surgery

## 2013-05-24 NOTE — Progress Notes (Signed)
Occupational Therapy Treatment Patient Details  Name: Betty Dawson MRN: [id number] Date of Birth: 1955/04/06  Today's Date: 05/24/2013 Time: 9833-8250 OT Time Calculation (min): 40 min MFR 850-904  14' Therex 539-767  26'   Visit#: 2 of 12  Re-eval: 06/14/13    Authorization:    Authorization Time Period:    Authorization Visit#:   of    Subjective Symptoms/Limitations Symptoms: S: I had a rough night last night at work. A lot of lifting and pulling. Pain Assessment Currently in Pain?: Yes Pain Score: 7  Pain Location: Shoulder Pain Orientation: Right Pain Type: Acute pain  Precautions/Restrictions  Precautions Precautions: None  Exercise/Treatments Supine Protraction: PROM;10 reps;AAROM;12 reps Horizontal ABduction: PROM;10 reps;AAROM;12 reps External Rotation: PROM;10 reps;AAROM;12 reps Internal Rotation: PROM;10 reps;AAROM;12 reps Flexion: PROM;10 reps;AAROM;12 reps ABduction: PROM;10 reps;AAROM;12 reps ROM / Strengthening / Isometric Strengthening Thumb Tacks: 1'          Manual Therapy Manual Therapy: Myofascial release Myofascial Release: MFR and manual stretching to Right upper arm, trapezius, and scapularis region to decrease fascial restrictions and increase joint mobility in a pain free zone.   Occupational Therapy Assessment and Plan OT Assessment and Plan Clinical Impression Statement: A: Patient had slight pain during AAROM exercises supine. Tolerated all exercises well.  Pt will benefit from skilled therapeutic intervention in order to improve on the following deficits: Impaired UE functional use;Increased fascial restricitons;Decreased range of motion;Pain;Decreased strength OT Plan: P: Add AAROM seated/standing, pulleys, pro/ret/elev/dep   Goals Short Term Goals Time to Complete Short Term Goals: 3 weeks Short Term Goal 1: Patient will be educated on HEP.  Short Term Goal 1 Progress: Progressing toward goal Short Term Goal 2: Patient  will increase AROM to [facility] to increase ability to reach up overhead with right arm.  Short Term Goal 2 Progress: Progressing toward goal Short Term Goal 3: Patient will increase Right shoulder strength to 4-/5 to increase ability to slide patient's up in bed.  Short Term Goal 3 Progress: Progressing toward goal Short Term Goal 4: Patient will report a pain score of 6/10 when completing daily activities.  Short Term Goal 4 Progress: Progressing toward goal Short Term Goal 5: Patient will decrease fascial restrictions in right shoulder to moderate amount.  Short Term Goal 5 Progress: Progressing toward goal Long Term Goals Time to Complete Long Term Goals: 6 weeks Long Term Goal 1: Patient will return to highest level of independence with all BADL, IADL, work and leisure activites.  Long Term Goal 1 Progress: Other (comment) Long Term Goal 2: Patient will increase right shoulder and scapular stability to be able to hold arm up overhead for 2 minutes or more during work tasks. Long Term Goal 2 Progress: Other (comment) Long Term Goal 3: Patient will increase Right shoulder strength to 4+/5 to increase ability to slide patient's up in bed.  Long Term Goal 3 Progress: Other (comment) Long Term Goal 4: Patient will report a pain score of 3/10 when completing daily activities.  Long Term Goal 4 Progress: Other (comment) Long Term Goal 5: Patient will decrease fascial restrictions in right shoulder to minimal amount.  Long Term Goal 5 Progress: Progressing toward goal  Problem List Patient Active Problem List   Diagnosis Date Noted  . Muscle weakness (generalized) 05/17/2013  . Rotator cuff syndrome 05/17/2013  . Right shoulder pain 03/28/2013  . Varicose veins of lower extremities with other complications 34/19/3790  . Heartburn 07/21/2011  . Ankle pain, chronic 06/02/2011  . HTN (  hypertension) 05/20/2011  . OA (osteoarthritis) 05/20/2011  . Varicose veins 05/20/2011  . Obesity (BMI 30-39.9)  05/20/2011    End of Session Activity Tolerance: Patient tolerated treatment well General Behavior During Therapy: [facility] for tasks assessed/performed OT Plan of Care Consulted and Agree with Plan of Care: Patient   Ailene Ravel, OTR/L,CBIS   05/24/2013, 10:16 AM

## 2013-05-26 ENCOUNTER — Ambulatory Visit (HOSPITAL_COMMUNITY): Payer: 59 | Admitting: Specialist

## 2013-05-30 ENCOUNTER — Ambulatory Visit (HOSPITAL_COMMUNITY)
Admission: RE | Admit: 2013-05-30 | Discharge: 2013-05-30 | Disposition: A | Discharge status: Home / Self Care | Payer: 59 | Source: Ambulatory Visit | Attending: Orthopedic Surgery | Admitting: Orthopedic Surgery

## 2013-05-30 DIAGNOSIS — M25519 Pain in unspecified shoulder: Secondary | ICD-10-CM | POA: Insufficient documentation

## 2013-05-30 DIAGNOSIS — M6281 Muscle weakness (generalized): Secondary | ICD-10-CM | POA: Insufficient documentation

## 2013-05-30 DIAGNOSIS — I10 Essential (primary) hypertension: Secondary | ICD-10-CM | POA: Insufficient documentation

## 2013-05-30 DIAGNOSIS — IMO0001 Reserved for inherently not codable concepts without codable children: Secondary | ICD-10-CM | POA: Insufficient documentation

## 2013-05-30 NOTE — Progress Notes (Signed)
Occupational Therapy Treatment Patient Details  Name: Betty Dawson MRN: [id number] Date of Birth: [birth year]  Today's Date: 05/30/2013 Time: 9741-6384 OT Time Calculation (min): 38 min MFR 536-468 10' Therex 032-1224 28'  Visit#: 3 of 12  Re-eval: 06/14/13    Authorization:    Authorization Time Period:    Authorization Visit#:   of    Subjective Symptoms/Limitations Symptoms: S: I had another rough night at work.  Pain Assessment Currently in Pain?: Yes Pain Score: 7  Pain Location: Shoulder Pain Orientation: Right Pain Type: Acute pain  Precautions/Restrictions  Precautions Precautions: None  Exercise/Treatments Supine Protraction: PROM;10 reps;AAROM;12 reps Horizontal ABduction: PROM;10 reps;AAROM;12 reps External Rotation: PROM;10 reps;AAROM;12 reps Internal Rotation: PROM;10 reps;AAROM;12 reps Flexion: PROM;10 reps;AAROM;12 reps ABduction: PROM;10 reps;AAROM;12 reps Standing Protraction: AAROM;10 reps Horizontal ABduction: AAROM;10 reps External Rotation: AAROM;10 reps Internal Rotation: AAROM;10 reps Flexion: AAROM;10 reps ABduction: AAROM;10 reps ROM / Strengthening / Isometric Strengthening Thumb Tacks: 1' Prot/Ret//Elev/Dep: 1'      Manual Therapy Manual Therapy: Myofascial release Myofascial Release: MFR and manual stretching to Right upper arm, trapezius, and scapularis region to decrease fascial restrictions and increase joint mobility in a pain free zone.  Occupational Therapy Assessment and Plan OT Assessment and Plan Clinical Impression Statement: A: Added AAROM standing and pro/ret/elev/dep. Patient had slight pain but tolerated well. Patient was hesistant about continuing therapy stating it was hard to do with work. Encouraged patient to continue until the 4 week mark to see if she is able to make some progress. Patient agreed.  OT Plan: P: Add to HEP. Add Theraband (extension, row, retraction) wall wash   Goals Short Term Goals Time to  Complete Short Term Goals: 3 weeks Short Term Goal 1: Patient will be educated on HEP.  Short Term Goal 1 Progress: Progressing toward goal Short Term Goal 2: Patient will increase AROM to [facility] to increase ability to reach up overhead with right arm.  Short Term Goal 2 Progress: Progressing toward goal Short Term Goal 3: Patient will increase Right shoulder strength to 4-/5 to increase ability to slide patient's up in bed.  Short Term Goal 3 Progress: Progressing toward goal Short Term Goal 4: Patient will report a pain score of 6/10 when completing daily activities.  Short Term Goal 4 Progress: Progressing toward goal Short Term Goal 5: Patient will decrease fascial restrictions in right shoulder to moderate amount.  Short Term Goal 5 Progress: Progressing toward goal Long Term Goals Time to Complete Long Term Goals: 6 weeks Long Term Goal 1: Patient will return to highest level of independence with all BADL, IADL, work and leisure activites.  Long Term Goal 1 Progress: Progressing toward goal Long Term Goal 2: Patient will increase right shoulder and scapular stability to be able to hold arm up overhead for 2 minutes or more during work tasks. Long Term Goal 2 Progress: Progressing toward goal Long Term Goal 3: Patient will increase Right shoulder strength to 4+/5 to increase ability to slide patient's up in bed.  Long Term Goal 3 Progress: Progressing toward goal Long Term Goal 4: Patient will report a pain score of 3/10 when completing daily activities.  Long Term Goal 4 Progress: Progressing toward goal Long Term Goal 5: Patient will decrease fascial restrictions in right shoulder to minimal amount.  Long Term Goal 5 Progress: Progressing toward goal  Problem List Patient Active Problem List   Diagnosis Date Noted  . Muscle weakness (generalized) 05/17/2013  . Rotator cuff syndrome 05/17/2013  .  Right shoulder pain 03/28/2013  . Varicose veins of lower extremities with other  complications 88/82/8003  . Heartburn 07/21/2011  . Ankle pain, chronic 06/02/2011  . HTN (hypertension) 05/20/2011  . OA (osteoarthritis) 05/20/2011  . Varicose veins 05/20/2011  . Obesity (BMI 30-39.9) 05/20/2011    End of Session Activity Tolerance: Patient tolerated treatment well General Behavior During Therapy: [facility] for tasks assessed/performed OT Plan of Care Consulted and Agree with Plan of Care: Patient   Ailene Ravel, OTR/L,CBIS   05/30/2013, 10:18 AM

## 2013-06-01 ENCOUNTER — Ambulatory Visit (HOSPITAL_COMMUNITY)
Admission: RE | Admit: 2013-06-01 | Discharge: 2013-06-01 | Disposition: A | Discharge status: Home / Self Care | Payer: 59 | Source: Ambulatory Visit | Attending: Family Medicine | Admitting: Family Medicine

## 2013-06-01 NOTE — Progress Notes (Signed)
Occupational Therapy Treatment Patient Details  Name: Betty Dawson MRN: [id number] Date of Birth: [birth year]  Today's Date: 06/01/2013 Time: 2229-7989 OT Time Calculation (min): 36 min MFR 211-941 10' Therex 740-8144 26'  Visit#: 4 of 12  Re-eval: 06/14/13    Authorization:    Authorization Time Period:    Authorization Visit#:   of    Subjective Symptoms/Limitations Symptoms: S: I didn't work last night so my shoulder doesn't feel bad.  Pain Assessment Currently in Pain?: Yes Pain Score: 3  Pain Location: Shoulder Pain Orientation: Right Pain Type: Acute pain  Precautions/Restrictions   None  Exercise/Treatments Supine Protraction: PROM;10 reps;AAROM;12 reps Horizontal ABduction: PROM;10 reps;AAROM;12 reps External Rotation: PROM;10 reps;AAROM;12 reps Internal Rotation: PROM;10 reps;AAROM;12 reps Flexion: PROM;10 reps;AAROM;12 reps ABduction: PROM;10 reps;AAROM;12 reps Standing Extension: Theraband;10 reps Theraband Level (Shoulder Extension): Level 2 (Red) Row: Theraband;10 reps Theraband Level (Shoulder Row): Level 2 (Red) Retraction: Theraband;10 reps Theraband Level (Shoulder Retraction): Level 2 (Red)     Manual Therapy Manual Therapy: Myofascial release Myofascial Release: MFR and manual stretching to Right upper arm, trapezius, and scapularis region to decrease fascial restrictions and increase joint mobility in a pain free zone.  Occupational Therapy Assessment and Plan OT Assessment and Plan Clinical Impression Statement: A: Added theraband exercises to HEP. Patient given handout regarding rotator cuff syndrome and reviewed.  OT Plan: P: Wall wash    Goals Short Term Goals Time to Complete Short Term Goals: 3 weeks Short Term Goal 1: Patient will be educated on HEP.  Short Term Goal 2: Patient will increase AROM to [facility] to increase ability to reach up overhead with right arm.  Short Term Goal 3: Patient will increase Right shoulder strength to  4-/5 to increase ability to slide patient's up in bed.  Short Term Goal 4: Patient will report a pain score of 6/10 when completing daily activities.  Short Term Goal 5: Patient will decrease fascial restrictions in right shoulder to moderate amount.  Long Term Goals Time to Complete Long Term Goals: 6 weeks Long Term Goal 1: Patient will return to highest level of independence with all BADL, IADL, work and leisure activites.  Long Term Goal 2: Patient will increase right shoulder and scapular stability to be able to hold arm up overhead for 2 minutes or more during work tasks. Long Term Goal 3: Patient will increase Right shoulder strength to 4+/5 to increase ability to slide patient's up in bed.  Long Term Goal 4: Patient will report a pain score of 3/10 when completing daily activities.  Long Term Goal 5: Patient will decrease fascial restrictions in right shoulder to minimal amount.   Problem List Patient Active Problem List   Diagnosis Date Noted  . Muscle weakness (generalized) 05/17/2013  . Rotator cuff syndrome 05/17/2013  . Right shoulder pain 03/28/2013  . Varicose veins of lower extremities with other complications 81/85/6314  . Heartburn 07/21/2011  . Ankle pain, chronic 06/02/2011  . HTN (hypertension) 05/20/2011  . OA (osteoarthritis) 05/20/2011  . Varicose veins 05/20/2011  . Obesity (BMI 30-39.9) 05/20/2011    End of Session Activity Tolerance: Patient tolerated treatment well General Behavior During Therapy: WFL for tasks assessed/performed OT Plan of Care OT Home Exercise Plan: red theraband with HEP (retraction, row, extension) OT Patient Instructions: handout (scanned) Consulted and Agree with Plan of Care: Patient   Ailene Ravel, OTR/L,CBIS   06/01/2013, 11:33 AM

## 2013-06-06 ENCOUNTER — Ambulatory Visit (HOSPITAL_COMMUNITY)
Admission: RE | Admit: 2013-06-06 | Discharge: 2013-06-06 | Disposition: A | Discharge status: Home / Self Care | Payer: 59 | Source: Ambulatory Visit | Attending: Family Medicine | Admitting: Family Medicine

## 2013-06-06 ENCOUNTER — Other Ambulatory Visit: Payer: Self-pay | Admitting: *Deleted

## 2013-06-06 MED ORDER — HYDROCHLOROTHIAZIDE 25 MG PO TABS: 25.0000 mg | ORAL_TABLET | Freq: Every day | ORAL | Status: DC

## 2013-06-06 NOTE — Progress Notes (Signed)
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  Pain Assessment Currently in Pain?: Yes Pain Score: 3  Pain Location: Shoulder Pain Orientation: Right Pain Type: Acute pain  Precautions/Restrictions  Precautions Precautions: None  Exercise/Treatments Supine Protraction: PROM;10 reps;AAROM;12 reps Horizontal ABduction: PROM;10 reps;AAROM;12 reps External Rotation: PROM;10 reps;AAROM;12 reps Internal Rotation: PROM;10 reps;AAROM;12 reps Flexion: PROM;10 reps;AAROM;12 reps ABduction: PROM;10 reps;AAROM;12 reps Standing Protraction: AAROM;12 reps Horizontal ABduction: AAROM;12 reps External Rotation: AAROM;12 reps Internal Rotation: AAROM;12 reps Flexion: AAROM;12 reps ABduction: AAROM;12 reps Extension: Theraband;12 reps Theraband Level (Shoulder Extension): Level 2 (Red) Row: Theraband;12 reps Theraband Level (Shoulder Row): Level 2 (Red) Retraction: Theraband;12 reps Theraband Level (Shoulder Retraction): Level 2 (Red) ROM / Strengthening / Isometric Strengthening UBE (Upper Arm Bike): 1.0 3' forward 3' reverse Wall Wash: 1'       Manual Therapy Manual Therapy: Myofascial release Myofascial Release: MFR and manual stretching to Right upper arm, trapezius, and scapularis region to decrease fascial restrictions and increase joint mobility in a pain free zone.  Occupational Therapy Assessment and Plan OT Assessment and Plan Clinical Impression Statement: A: Added wall wash and UBE bike. Patient tolerated well.  OT Plan: P: Add AROM supine and standing.     Goals Short Term Goals Time to Complete Short Term Goals: 3 weeks Short Term Goal 1: Patient will be educated on HEP.  Short Term Goal 1 Progress: Progressing toward goal Short Term Goal 2: Patient will increase AROM to [facility] to increase ability to reach up overhead with right arm.  Short Term Goal 2 Progress: Progressing toward goal Short Term Goal 3: Patient will increase Right shoulder strength to 4-/5 to increase ability to slide patient's up in bed.  Short Term Goal 3 Progress: Progressing toward goal Short Term Goal 4: Patient will report a pain score of 6/10 when completing daily activities.  Short Term Goal 4 Progress: Progressing toward goal Short Term Goal 5: Patient will decrease fascial restrictions in right shoulder to moderate amount.  Short Term Goal 5 Progress: Progressing toward goal Long Term Goals Time to Complete Long Term Goals: 6 weeks Long Term Goal 1: Patient will return to highest level of independence with all BADL, IADL, work and leisure activites.  Long Term Goal 1 Progress: Progressing toward goal Long Term Goal 2: Patient will increase right shoulder and scapular stability to be able to hold arm up overhead for 2 minutes or more during work tasks. Long Term Goal 2 Progress: Progressing toward goal Long Term Goal 3: Patient will increase Right shoulder strength to 4+/5 to increase ability to slide patient's up in bed.  Long Term Goal 3 Progress: Progressing toward goal Long Term Goal 4: Patient will report a pain score of 3/10 when completing daily activities.  Long Term Goal 4 Progress: Progressing toward goal Long Term Goal 5: Patient will decrease fascial restrictions in right shoulder to minimal amount.  Long Term Goal 5 Progress: Progressing toward goal  Problem List Patient Active Problem List   Diagnosis Date Noted  . Muscle weakness (generalized) 05/17/2013  . Rotator cuff syndrome 05/17/2013  .  Right shoulder pain 03/28/2013  . Varicose veins of  lower extremities with other complications 24/40/1027  . Heartburn 07/21/2011  . Ankle pain, chronic 06/02/2011  . HTN (hypertension) 05/20/2011  . OA (osteoarthritis) 05/20/2011  . Varicose veins 05/20/2011  . Obesity (BMI 30-39.9) 05/20/2011    End of Session Activity Tolerance: Patient tolerated treatment well General Behavior During Therapy: [facility] for tasks assessed/performed OT Plan of Care Consulted and Agree with Plan of Care: Patient   Ailene Ravel, OTR/L,CBIS   06/06/2013, 11:41 AM

## 2013-06-06 NOTE — Telephone Encounter (Signed)
Refill appropriate and filled per protocol. 

## 2013-06-08 ENCOUNTER — Ambulatory Visit (HOSPITAL_COMMUNITY)
Admission: RE | Admit: 2013-06-08 | Discharge: 2013-06-08 | Disposition: A | Discharge status: Home / Self Care | Payer: 59 | Source: Ambulatory Visit | Attending: Family Medicine | Admitting: Family Medicine

## 2013-06-08 NOTE — Progress Notes (Signed)
Occupational Therapy Treatment Patient Details  Name: CELICA KOTOWSKI MRN: [id number] Date of Birth: 1956-01-10  Today's Date: 06/08/2013 Time: 0175-1025 OT Time Calculation (min): 42 min Manual [phone] (15') Therapeutic Exercises [phone] (78')  Visit#: 6 of 12  Re-eval: 06/14/13    Authorization:    Authorization Time Period:    Authorization Visit#:   of    Subjective Symptoms/Limitations Symptoms: "Today's not doing too bad." Pain Assessment Currently in Pain?: Yes Pain Score: 5  Pain Location: Shoulder Pain Orientation: Right Pain Type: Acute pain  Precautions/Restrictions     Exercise/Treatments Supine Protraction: PROM;AROM;10 reps Horizontal ABduction: PROM;AROM;10 reps External Rotation: PROM;AROM;10 reps Internal Rotation: PROM;AROM;10 reps Flexion: PROM;AROM;10 reps ABduction: PROM;AROM;10 reps Standing Protraction: AROM;10 reps Horizontal ABduction: AROM;10 reps External Rotation: AROM;10 reps Internal Rotation: AROM;10 reps Flexion: AROM;10 reps ABduction: AROM;10 reps Extension: Theraband;15 reps Theraband Level (Shoulder Extension): Level 2 (Red) Row: Theraband;15 reps Theraband Level (Shoulder Row): Level 2 (Red) Retraction: Theraband;15 reps Theraband Level (Shoulder Retraction): Level 2 (Red) ROM / Strengthening / Isometric Strengthening UBE (Upper Arm Bike): 3 min forward and back at 1.5 resistance Wall Wash: 1:30   Manual Therapy Manual Therapy: Myofascial release Myofascial Release: MFR and manual stretching to Right upper arm, trapezius, and scapularis region to decrease fascial restrictions and increase joint mobility in a pain free zone.    Occupational Therapy Assessment and Plan OT Assessment and Plan Clinical Impression Statement: Added supine and standing AROM this session. Pt required cues for slow, deliberate movements, but tolerated well.Had min difficulty with theraband this session. Increawed UBE resistence. OT Plan:  Continue AROM in standing and supine. Increase theraband to green.  Add proximal shoulder strengthening.   Goals Short Term Goals Short Term Goal 1: Patient will be educated on HEP.  Short Term Goal 1 Progress: Progressing toward goal Short Term Goal 2: Patient will increase AROM to [facility] to increase ability to reach up overhead with right arm.  Short Term Goal 2 Progress: Progressing toward goal Short Term Goal 3: Patient will increase Right shoulder strength to 4-/5 to increase ability to slide patient's up in bed.  Short Term Goal 3 Progress: Progressing toward goal Short Term Goal 4: Patient will report a pain score of 6/10 when completing daily activities.  Short Term Goal 4 Progress: Progressing toward goal Short Term Goal 5: Patient will decrease fascial restrictions in right shoulder to moderate amount.  Short Term Goal 5 Progress: Progressing toward goal Long Term Goals Long Term Goal 1: Patient will return to highest level of independence with all BADL, IADL, work and leisure activites.  Long Term Goal 1 Progress: Progressing toward goal Long Term Goal 2: Patient will increase right shoulder and scapular stability to be able to hold arm up overhead for 2 minutes or more during work tasks. Long Term Goal 2 Progress: Progressing toward goal Long Term Goal 3: Patient will increase Right shoulder strength to 4+/5 to increase ability to slide patient's up in bed.  Long Term Goal 3 Progress: Progressing toward goal Long Term Goal 4: Patient will report a pain score of 3/10 when completing daily activities.  Long Term Goal 4 Progress: Progressing toward goal Long Term Goal 5: Patient will decrease fascial restrictions in right shoulder to minimal amount.  Long Term Goal 5 Progress: Progressing toward goal  Problem List Patient Active Problem List   Diagnosis Date Noted  . Muscle weakness (generalized) 05/17/2013  . Rotator cuff syndrome 05/17/2013  . Right shoulder pain 03/28/2013  .  Varicose veins of lower extremities with other complications 81/85/6314  . Heartburn 07/21/2011  . Ankle pain, chronic 06/02/2011  . HTN (hypertension) 05/20/2011  . OA (osteoarthritis) 05/20/2011  . Varicose veins 05/20/2011  . Obesity (BMI 30-39.9) 05/20/2011    End of Session Activity Tolerance: Patient tolerated treatment well General Behavior During Therapy: [facility] for tasks assessed/performed  GO    Bea Graff, MS, OTR/L [phone]  06/08/2013, 10:15 AM

## 2013-06-13 ENCOUNTER — Ambulatory Visit (HOSPITAL_COMMUNITY)
Admission: RE | Admit: 2013-06-13 | Discharge: 2013-06-13 | Disposition: A | Discharge status: Home / Self Care | Payer: 59 | Source: Ambulatory Visit | Attending: Family Medicine | Admitting: Family Medicine

## 2013-06-13 NOTE — Evaluation (Addendum)
Occupational Therapy Reassessment and Discharge Summary  Patient Details  Name: Betty Dawson MRN: [id number] Date of Birth: April 22, 1955  Today's Date: 06/13/2013 Time: 1020-1055 OT Time Calculation (min): 35 min MFR 1020-1030 10' Reassess 1030-1055 25'  Visit#: 7 of 12  Re-eval: 06/14/13  Assessment Diagnosis: Right rotator cuff syndrome  Past Medical History:  Past Medical History  Diagnosis Date  . Hypertension   . Heel spur   . Arthritis     OA multiple joints  . Varicose veins    Past Surgical History:  Past Surgical History  Procedure Laterality Date  . Foot surgery    . Abdominal hysterectomy      endometriosis  . Colonoscopy  02/21/2013    MPN:TIRWER polyp measuring 6 mm in size was found in the distal/transverse colon; polypectomy was performed using snare cautery/The colon was redundant/The colon mucosa was otherwise normal/Normal mucosa in the terminal ileum  . Colonoscopy N/A 02/21/2013    Procedure: COLONOSCOPY;  Surgeon: Danie Binder, MD;  Location: [facility];  Service: Endoscopy;  Laterality: N/A;  10:30 AM    Subjective Symptoms/Limitations Symptoms: S: I have pain constantly. I push myself though.  Special Tests: FOTO score: 50/100 (on eval: 53/100) Pain Assessment Currently in Pain?: Yes Pain Score: 4  Pain Location: Shoulder Pain Orientation: Right Pain Type: Acute pain  Precautions/Restrictions  Precautions Precautions: None  Assessment Additional Assessments RUE AROM (degrees) Right Shoulder Flexion: 148 Degrees (on eval: 125) Right Shoulder ABduction: 143 Degrees (on eval: 134) Right Shoulder Internal Rotation: 90 Degrees (on eval: 80) Right Shoulder External Rotation: 75 Degrees (on eval: 75) RUE Strength Right Shoulder Flexion:  (4-/5 (3+/5 on eval)) Right Shoulder ABduction:  (4+/5 ( 3+/5 on eval)) Right Shoulder Internal Rotation: 3+/5 (same on eval) Right Shoulder External Rotation: 3+/5 (same on  eval) Palpation Palpation: Min fascial restrictions in right upper arm, trapezius, and scapularis region.      Exercise/Treatments    Manual Therapy Manual Therapy: Myofascial release Myofascial Release: MFR and manual stretching to Right upper arm, trapezius, and scapularis region to decrease fascial restrictions and increase joint mobility in a pain free zone.  Occupational Therapy Assessment and Plan OT Assessment and Plan Clinical Impression Statement: A: Reassessment completed this date. Patient has made progress with joint mobility and strength but continues to have increased pain with all daily activities. Patient wishes to be discharged from therapy this date as she was hoping to decrease her pain. 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 Patient will continue to complete HEP at home independently. Patient has met 3/5 STGs and 3/5 LTGs. OT Plan: P: D/C from therapy.    Goals Short Term Goals Time to Complete Short Term Goals: 3 weeks Short Term Goal 1: Patient will be educated on HEP.  Short Term Goal 1 Progress: Met Short Term Goal 2: Patient will increase AROM to [facility] to increase ability to reach up overhead with right arm.  Short Term Goal 2 Progress: Met Short Term Goal 3: Patient will increase Right shoulder strength to 4-/5 to increase ability to slide patient's up in bed.  Short Term Goal 3 Progress: Partly met Short Term Goal 4: Patient will report a pain score of 6/10 when completing daily activities.  Short Term Goal 4 Progress: Not met Short Term Goal 5: Patient will decrease fascial restrictions in right shoulder to moderate amount.  Short  Term Goal 5 Progress: Met Long Term Goals Time to Complete Long Term Goals: 6 weeks Long Term Goal 1: Patient will return to highest level of independence with all BADL, IADL, work and leisure activites.  Long  Term Goal 1 Progress: Met Long Term Goal 2: Patient will increase right shoulder and scapular stability to be able to hold arm up overhead for 2 minutes or more during work tasks. Long Term Goal 2 Progress: Met Long Term Goal 3: Patient will increase Right shoulder strength to 4+/5 to increase ability to slide patient's up in bed.  Long Term Goal 3 Progress: Partly met Long Term Goal 4: Patient will report a pain score of 3/10 when completing daily activities.  Long Term Goal 4 Progress: Not met Long Term Goal 5: Patient will decrease fascial restrictions in right shoulder to minimal amount.  Long Term Goal 5 Progress: Met  Problem List Patient Active Problem List   Diagnosis Date Noted  . Muscle weakness (generalized) 05/17/2013  . Rotator cuff syndrome 05/17/2013  . Right shoulder pain 03/28/2013  . Varicose veins of lower extremities with other complications 21/19/4174  . Heartburn 07/21/2011  . Ankle pain, chronic 06/02/2011  . HTN (hypertension) 05/20/2011  . OA (osteoarthritis) 05/20/2011  . Varicose veins 05/20/2011  . Obesity (BMI 30-39.9) 05/20/2011    End of Session Activity Tolerance: Patient tolerated treatment well General Behavior During Therapy: [facility] for tasks assessed/performed   Ailene Ravel, OTR/L,CBIS   06/13/2013, 11:03 AM  Physician Documentation Your signature is required to indicate approval of the treatment plan as stated above.  Please sign and either send electronically or make a copy of this report for your files and return this physician signed original.  Please mark one 1.__approve of plan  2. ___approve of plan with the following conditions.   ______________________________                                                          _____________________ Physician Signature                                                                                                             Date

## 2013-06-15 ENCOUNTER — Ambulatory Visit (HOSPITAL_COMMUNITY): Payer: 59

## 2013-06-21 ENCOUNTER — Ambulatory Visit (HOSPITAL_COMMUNITY): Payer: 59

## 2013-06-23 ENCOUNTER — Ambulatory Visit (HOSPITAL_COMMUNITY): Payer: 59

## 2013-07-31 ENCOUNTER — Ambulatory Visit: Payer: 59 | Admitting: Family Medicine

## 2013-08-08 ENCOUNTER — Ambulatory Visit (INDEPENDENT_AMBULATORY_CARE_PROVIDER_SITE_OTHER): Payer: 59 | Admitting: Family Medicine

## 2013-08-08 ENCOUNTER — Encounter: Payer: Self-pay | Admitting: Family Medicine

## 2013-08-08 VITALS — BP 142/84 | HR 68 | Temp 98.1°F | Resp 12 | Ht 66.0 in | Wt 200.0 lb

## 2013-08-08 DIAGNOSIS — M719 Bursopathy, unspecified: Secondary | ICD-10-CM

## 2013-08-08 DIAGNOSIS — M75101 Unspecified rotator cuff tear or rupture of right shoulder, not specified as traumatic: Secondary | ICD-10-CM

## 2013-08-08 DIAGNOSIS — E669 Obesity, unspecified: Secondary | ICD-10-CM

## 2013-08-08 DIAGNOSIS — I10 Essential (primary) hypertension: Secondary | ICD-10-CM

## 2013-08-08 DIAGNOSIS — M67919 Unspecified disorder of synovium and tendon, unspecified shoulder: Secondary | ICD-10-CM

## 2013-08-08 MED ORDER — OMEPRAZOLE 20 MG PO CPDR: 20.0000 mg | DELAYED_RELEASE_CAPSULE | Freq: Every day | ORAL | Status: DC

## 2013-08-08 MED ORDER — HYDROCHLOROTHIAZIDE 25 MG PO TABS: 25.0000 mg | ORAL_TABLET | Freq: Every day | ORAL | Status: DC

## 2013-08-08 MED ORDER — OXYCODONE-ACETAMINOPHEN 5-325 MG PO TABS: 1.0000 | ORAL_TABLET | Freq: Three times a day (TID) | ORAL | Status: DC | PRN

## 2013-08-08 MED ORDER — AMLODIPINE BESYLATE 5 MG PO TABS: ORAL_TABLET | ORAL | Status: DC

## 2013-08-08 NOTE — Patient Instructions (Signed)
F/u 6 months

## 2013-08-08 NOTE — Assessment & Plan Note (Signed)
Other advice her followup with orthopedics regarding her shoulder. I've given her a prescription for Percocet to try for severe pain after she comes from her shift she will continue the tramadol otherwise during the day

## 2013-08-08 NOTE — Assessment & Plan Note (Signed)
Continue to work on dietary changes, weight gain noted

## 2013-08-08 NOTE — Assessment & Plan Note (Signed)
Will restart blood pressure medications her blood pressure was well controlled on this

## 2013-08-08 NOTE — Progress Notes (Signed)
Patient ID: Betty Dawson, female   DOB: 1956/01/01, 58 y.o.   MRN: [id number]   Subjective:    Patient ID: Betty Dawson, female    DOB: 03/01/1955, 58 y.o.   MRN: [id number]  Patient presents for 4 month F/U  patient here to follow chronic medical problems. She was seen by orthopedics since her last visit secondary to right rotator cuff syndrome she did have a subacromial injection done however this did not help. She also underwent physical therapy she has minimal improvement in her range of motion. She's not followed up with orthopedics since then but states that her pain is still baling interfering with her regular activities and her job. She's very wary of having surgery and she thinks that this may be the next that. She's taking the tramadol but it does not help very much especially after work  Hypertension she's been out of her blood pressure medicine for the past week    Review Of Systems:  GEN- denies fatigue, fever, weight loss,weakness, recent illness HEENT- denies eye drainage, change in vision, nasal discharge, CVS- denies chest pain, palpitations RESP- denies SOB, cough, wheeze ABD- denies N/V, change in stools, abd pain GU- denies dysuria, hematuria, dribbling, incontinence MSK- + joint pain, muscle aches, injury Neuro- denies headache, dizziness, syncope, seizure activity       Objective:    BP 142/84  Pulse 68  Temp(Src) 98.1 F (36.7 C) (Oral)  Resp 12  Ht 5\' 6"  (1.676 m)  Wt 200 lb (90.719 kg)  BMI 32.30 kg/m2 GEN- NAD, alert and oriented x3 HEENT- PERRL, EOMI, non injected sclera, pink conjunctiva, MMM, oropharynx clear CVS- RRR, no murmur RESP-CTAB MSK- Right shoulder- Decreased ROM, + empty can, normal tone Ext- no edema, varicose veins bilat Pulse- Radial 2+       Assessment & Plan:      Problem List Items Addressed This Visit   None      Note: This dictation was prepared with Dragon dictation along with smaller phrase technology. Any  transcriptional errors that result from this process are unintentional.

## 2013-09-29 ENCOUNTER — Ambulatory Visit (INDEPENDENT_AMBULATORY_CARE_PROVIDER_SITE_OTHER): Payer: 59 | Admitting: Family Medicine

## 2013-09-29 ENCOUNTER — Encounter: Payer: Self-pay | Admitting: Family Medicine

## 2013-09-29 VITALS — BP 136/72 | HR 68 | Temp 98.2°F | Resp 16 | Ht 65.5 in | Wt 196.0 lb

## 2013-09-29 DIAGNOSIS — L97509 Non-pressure chronic ulcer of other part of unspecified foot with unspecified severity: Secondary | ICD-10-CM

## 2013-09-29 DIAGNOSIS — L97511 Non-pressure chronic ulcer of other part of right foot limited to breakdown of skin: Secondary | ICD-10-CM

## 2013-09-29 NOTE — Progress Notes (Signed)
Patient ID: Betty Dawson, female   DOB: [birth year], 58 y.o.   MRN: [id number]   Subjective:    Patient ID: Betty Dawson, female    DOB: 08-24-1955, 58 y.o.   MRN: [id number]  Patient presents for R foot sore  patient here with a sore on her right inner heel. This occurred about 2 weeks ago. She denies any particular injury but states that after she was wearing her boots at work she noticed it rubbing and then a cracked open. She's been using dribbling about appointment in trying to keep it clean but it has not healed completely. She typically only has pain if there something rubbing against it.    Review Of Systems:  GEN- denies fatigue, fever, weight loss,weakness, recent illness HEENT- denies eye drainage, change in vision, nasal discharge, CVS- denies chest pain, palpitations RESP- denies SOB, cough, wheeze MSK-+oint pain, muscle aches, injury Neuro- denies headache, dizziness, syncope, seizure activity       Objective:    BP 136/72  Pulse 68  Temp(Src) 98.2 F (36.8 C) (Oral)  Resp 16  Ht 5' 5.5" (1.664 m)  Wt 196 lb (88.905 kg)  BMI 32.11 kg/m2 GEN- NAD, alert and oriented x3 Pulse- DP palpable Bilat  Skin- Right inner heel - small opening size of eraser head with cracked edges, no drainage, no erythema, varicose veins surrounding        Assessment & Plan:      Problem List Items Addressed This Visit   None    Visit Diagnoses   Ulcerated, foot, right, limited to breakdown of skin    -  Primary    I think this came as result of trauma with boots rubbing, no sign of infection, duoderm applied the edges are appromating nicely so should heal up quickly with this       Note: This dictation was prepared with Dragon dictation along with smaller phrase technology. Any transcriptional errors that result from this process are unintentional.

## 2013-09-29 NOTE — Patient Instructions (Signed)
Keep the area clean and dry Apply the duoderm dressing Call if this is not healing up F/U as previous

## 2013-10-20 ENCOUNTER — Other Ambulatory Visit: Payer: Self-pay | Admitting: Family Medicine

## 2013-10-20 DIAGNOSIS — R229 Localized swelling, mass and lump, unspecified: Principal | ICD-10-CM

## 2013-10-20 DIAGNOSIS — IMO0002 Reserved for concepts with insufficient information to code with codable children: Secondary | ICD-10-CM

## 2013-10-24 ENCOUNTER — Ambulatory Visit (HOSPITAL_COMMUNITY)
Admission: RE | Admit: 2013-10-24 | Discharge: 2013-10-24 | Disposition: A | Discharge status: Home / Self Care | Payer: 59 | Source: Ambulatory Visit | Attending: Family Medicine | Admitting: Family Medicine

## 2013-10-24 ENCOUNTER — Other Ambulatory Visit: Payer: Self-pay | Admitting: Family Medicine

## 2013-10-24 DIAGNOSIS — R229 Localized swelling, mass and lump, unspecified: Secondary | ICD-10-CM | POA: Diagnosis present

## 2013-10-24 DIAGNOSIS — IMO0002 Reserved for concepts with insufficient information to code with codable children: Secondary | ICD-10-CM

## 2014-02-09 ENCOUNTER — Ambulatory Visit: Payer: 59 | Admitting: Family Medicine

## 2014-02-19 ENCOUNTER — Encounter: Payer: Self-pay | Admitting: Family Medicine

## 2014-02-21 ENCOUNTER — Ambulatory Visit: Payer: Self-pay | Admitting: Family Medicine

## 2014-03-02 ENCOUNTER — Encounter: Payer: Self-pay | Admitting: Family Medicine

## 2014-03-02 ENCOUNTER — Ambulatory Visit (INDEPENDENT_AMBULATORY_CARE_PROVIDER_SITE_OTHER): Payer: 59 | Admitting: Family Medicine

## 2014-03-02 VITALS — BP 138/84 | HR 62 | Temp 98.0°F | Resp 14 | Ht 66.0 in | Wt 199.0 lb

## 2014-03-02 DIAGNOSIS — M8949 Other hypertrophic osteoarthropathy, multiple sites: Secondary | ICD-10-CM

## 2014-03-02 DIAGNOSIS — I10 Essential (primary) hypertension: Secondary | ICD-10-CM

## 2014-03-02 DIAGNOSIS — E669 Obesity, unspecified: Secondary | ICD-10-CM

## 2014-03-02 DIAGNOSIS — M159 Polyosteoarthritis, unspecified: Secondary | ICD-10-CM

## 2014-03-02 DIAGNOSIS — M150 Primary generalized (osteo)arthritis: Secondary | ICD-10-CM

## 2014-03-02 LAB — CBC WITH DIFFERENTIAL/PLATELET
Basophils Absolute: 0 10*3/uL (ref 0.0–0.1)
Basophils Relative: 0 % (ref 0–1)
EOS PCT: 2 % (ref 0–5)
Eosinophils Absolute: 0.1 10*3/uL (ref 0.0–0.7)
HCT: 33.8 % — ABNORMAL LOW (ref 36.0–46.0)
Hemoglobin: 11 g/dL — ABNORMAL LOW (ref 12.0–15.0)
LYMPHS PCT: 38 % (ref 12–46)
Lymphs Abs: 2.2 10*3/uL (ref 0.7–4.0)
MCH: 27.5 pg (ref 26.0–34.0)
MCHC: 32.5 g/dL (ref 30.0–36.0)
MCV: 84.5 fL (ref 78.0–100.0)
MONO ABS: 0.2 10*3/uL (ref 0.1–1.0)
MPV: 10.8 fL (ref 8.6–12.4)
Monocytes Relative: 3 % (ref 3–12)
NEUTROS ABS: 3.3 10*3/uL (ref 1.7–7.7)
Neutrophils Relative %: 57 % (ref 43–77)
Platelets: 273 10*3/uL (ref 150–400)
RBC: 4 MIL/uL (ref 3.87–5.11)
RDW: 13.6 % (ref 11.5–15.5)
WBC: 5.8 10*3/uL (ref 4.0–10.5)

## 2014-03-02 LAB — COMPREHENSIVE METABOLIC PANEL
ALK PHOS: 93 U/L (ref 39–117)
ALT: 10 U/L (ref 0–35)
AST: 15 U/L (ref 0–37)
Albumin: 3.9 g/dL (ref 3.5–5.2)
BUN: 17 mg/dL (ref 6–23)
CHLORIDE: 104 meq/L (ref 96–112)
CO2: 27 meq/L (ref 19–32)
Calcium: 9.2 mg/dL (ref 8.4–10.5)
Creat: 0.75 mg/dL (ref 0.50–1.10)
GLUCOSE: 86 mg/dL (ref 70–99)
Potassium: 4.3 mEq/L (ref 3.5–5.3)
Sodium: 142 mEq/L (ref 135–145)
TOTAL PROTEIN: 7.1 g/dL (ref 6.0–8.3)
Total Bilirubin: 0.3 mg/dL (ref 0.2–1.2)

## 2014-03-02 LAB — LIPID PANEL
CHOL/HDL RATIO: 2 ratio
CHOLESTEROL: 131 mg/dL (ref 0–200)
HDL: 64 mg/dL (ref >39)
LDL CALC: 59 mg/dL (ref 0–99)
Triglycerides: 41 mg/dL (ref <150)
VLDL: 8 mg/dL (ref 0–40)

## 2014-03-02 MED ORDER — HYDROCHLOROTHIAZIDE 25 MG PO TABS: 25.0000 mg | ORAL_TABLET | Freq: Every day | ORAL | Status: DC

## 2014-03-02 MED ORDER — AMLODIPINE BESYLATE 5 MG PO TABS: ORAL_TABLET | ORAL | Status: DC

## 2014-03-02 NOTE — Patient Instructions (Addendum)
Continue current medications We will send lab results via letter Work on the weightt loss, goal 5-10lbs by next visit, more water, no SODA, fruits and veggies/fruits, no fried foods F/U 6 months for physical

## 2014-03-02 NOTE — Progress Notes (Signed)
Patient ID: Betty Dawson, female   DOB: 19-Nov-1955, 59 y.o.   MRN: [id number]   Subjective:    Patient ID: Betty Dawson, female    DOB: [birth year], 59 y.o.   MRN: [id number]  Patient presents for 6 month F/U    Pt here to f/u chronic medical problems, no concerns, Taking medications without any side effects. Due for fasting labs  She has gained 5 lbs back since our last visit Chronic joint pain unchanged, uses tramadol as needed, she is going to cut back her hours at work this year         Review Of Systems:  GEN- denies fatigue, fever, weight loss,weakness, recent illness HEENT- denies eye drainage, change in vision, nasal discharge, CVS- denies chest pain, palpitations RESP- denies SOB, cough, wheeze ABD- denies N/V, change in stools, abd pain GU- denies dysuria, hematuria, dribbling, incontinence MSK- + joint pain, muscle aches, injury Neuro- denies headache, dizziness, syncope, seizure activity       Objective:    BP 138/84 mmHg  Pulse 62  Temp(Src) 98 F (36.7 C) (Oral)  Resp 14  Ht 5\' 6"  (1.676 m)  Wt 199 lb (90.266 kg)  BMI 32.13 kg/m2 GEN- NAD, alert and oriented x3 HEENT- PERRL, EOMI, non injected sclera, pink conjunctiva, MMM, oropharynx clear CVS- RRR, no murmur RESP-CTAB EXT- No edema, +varicose veins Pulses- Radial 2+        Assessment & Plan:      Problem List Items Addressed This Visit      Unprioritized   Obesity (BMI 30-39.9)   Relevant Orders   Lipid panel   OA (osteoarthritis)   HTN (hypertension) - Primary   Relevant Orders   CBC with Differential/Platelet   Comprehensive metabolic panel   Lipid panel      Note: This dictation was prepared with Dragon dictation along with smaller phrase technology. Any transcriptional errors that result from this process are unintentional.

## 2014-03-02 NOTE — Assessment & Plan Note (Signed)
Continue ultram  

## 2014-03-02 NOTE — Assessment & Plan Note (Signed)
Well controlled, no change to medications Fasting labs today

## 2014-03-02 NOTE — Assessment & Plan Note (Signed)
Discussed dietary changes, low carb, more water and veggies Exercise is difficult due to her OA, advised something more low impact, she can do in her home Short term goal set by next visit

## 2014-03-07 ENCOUNTER — Encounter: Payer: Self-pay | Admitting: *Deleted

## 2014-04-20 ENCOUNTER — Encounter: Payer: Self-pay | Admitting: Family Medicine

## 2014-05-29 ENCOUNTER — Emergency Department (HOSPITAL_COMMUNITY): Payer: 59

## 2014-05-29 ENCOUNTER — Encounter (HOSPITAL_COMMUNITY): Payer: Self-pay

## 2014-05-29 ENCOUNTER — Emergency Department (HOSPITAL_COMMUNITY)
Admission: EM | Admit: 2014-05-29 | Discharge: 2014-05-29 | Disposition: A | Discharge status: Home / Self Care | Payer: 59 | Attending: Emergency Medicine | Admitting: Emergency Medicine

## 2014-05-29 DIAGNOSIS — M25572 Pain in left ankle and joints of left foot: Secondary | ICD-10-CM | POA: Diagnosis not present

## 2014-05-29 DIAGNOSIS — M79604 Pain in right leg: Secondary | ICD-10-CM | POA: Diagnosis not present

## 2014-05-29 DIAGNOSIS — I10 Essential (primary) hypertension: Secondary | ICD-10-CM | POA: Diagnosis not present

## 2014-05-29 DIAGNOSIS — Z86718 Personal history of other venous thrombosis and embolism: Secondary | ICD-10-CM | POA: Insufficient documentation

## 2014-05-29 DIAGNOSIS — Z79899 Other long term (current) drug therapy: Secondary | ICD-10-CM | POA: Insufficient documentation

## 2014-05-29 DIAGNOSIS — M79671 Pain in right foot: Secondary | ICD-10-CM

## 2014-05-29 DIAGNOSIS — M79672 Pain in left foot: Secondary | ICD-10-CM

## 2014-05-29 DIAGNOSIS — M25571 Pain in right ankle and joints of right foot: Secondary | ICD-10-CM | POA: Insufficient documentation

## 2014-05-29 MED ORDER — ACETAMINOPHEN 325 MG PO TABS
650.0000 mg | ORAL_TABLET | Freq: Once | ORAL | Status: AC
  Administered 2014-05-29: 650 mg via ORAL
  Filled 2014-05-29: qty 2

## 2014-05-29 NOTE — ED Provider Notes (Signed)
CSN: [id number]     Arrival date & time 05/29/14  1438 History   First MD Initiated Contact with Patient 05/29/14 1534     Chief Complaint  Patient presents with  . Foot Pain  . Leg Pain     (Consider location/radiation/quality/duration/timing/severity/associated sxs/prior Treatment) HPI Comments: 59 year-old female with history of varicose veins, high blood pressure, osteoarthritis, DVT associated with pregnancy presents with right calf pain and bilateral foot and ankle pain worsening for the past few days. Patient is had arthritis symptoms however she feels the foot and ankle pain is different than the calf pain. No leg swelling, no recent surgeries, no active cancer, no fevers or chills or shortness of breath. Pain worse and she starts to get up and walk and then gets better. No vascular disease history. No cold sensation of the feet.  Patient is a 59 y.o. female presenting with lower extremity pain and leg pain. The history is provided by the patient.  Foot Pain  Leg Pain Associated symptoms: no fever     Past Medical History  Diagnosis Date  . Hypertension   . Heel spur   . Arthritis     OA multiple joints  . Varicose veins    Past Surgical History  Procedure Laterality Date  . Foot surgery    . Abdominal hysterectomy      endometriosis  . Colonoscopy  02/21/2013    [id number] polyp measuring 6 mm in size was found in the distal/transverse colon; polypectomy was performed using snare cautery/The colon was redundant/The colon mucosa was otherwise normal/Normal mucosa in the terminal ileum  . Colonoscopy N/A 02/21/2013    Procedure: COLONOSCOPY;  Surgeon: Danie Binder, MD;  Location: [facility];  Service: Endoscopy;  Laterality: N/A;  10:30 AM   Family History  Problem Relation Age of Onset  . Hypertension Mother   . Cancer Mother   . Diabetes Sister   . Hyperlipidemia Sister   . Hypertension Sister   . Diabetes Brother   . Hypertension Brother    History   Substance Use Topics  . Smoking status: Never Smoker   . Smokeless tobacco: Never Used  . Alcohol Use: No   OB History    No data available     Review of Systems  Constitutional: Negative for fever.  Cardiovascular: Negative for leg swelling.  Musculoskeletal: Positive for arthralgias.  Skin: Negative for rash.      Allergies  Lisinopril  Home Medications   Prior to Admission medications   Medication Sig Start Date End Date Taking? Authorizing Provider  amLODipine (NORVASC) 5 MG tablet One tab daily Patient taking differently: Take 5 mg by mouth daily.  03/02/14  Yes Alycia Rossetti, MD  ferrous sulfate (KP FERROUS SULFATE) 325 (65 FE) MG tablet Take 325 mg by mouth daily with breakfast.   Yes Historical Provider, MD  hydrochlorothiazide (HYDRODIURIL) 25 MG tablet Take 1 tablet (25 mg total) by mouth daily. 03/02/14  Yes Alycia Rossetti, MD  omeprazole (PRILOSEC) 20 MG capsule Take 1 capsule (20 mg total) by mouth daily. Patient not taking: Reported on 05/29/2014 08/08/13   Alycia Rossetti, MD  traMADol (ULTRAM) 50 MG tablet Take 2 tablets (100 mg total) by mouth every 8 (eight) hours as needed. Patient not taking: Reported on 05/29/2014 12/23/12   Alycia Rossetti, MD   BP 171/75 mmHg  Pulse 60  Temp(Src) 97.3 F (36.3 C) (Oral)  Resp 16  Ht 5\' 8"  (1.727  m)  Wt 192 lb (87.091 kg)  BMI 29.20 kg/m2  SpO2 97% Physical Exam  Constitutional: She appears well-developed and well-nourished. No distress.  Cardiovascular: Normal rate.   Pulmonary/Chest: Effort normal.  Musculoskeletal: She exhibits tenderness. She exhibits no edema.  Focal tenderness medial aspect of midfoot bilateral without sign of infection, full range of motion of foot and ankle.  Neurological: She is alert.  Skin: Skin is warm. No erythema.  Patient has mild tenderness around right calf no swelling, no rash, neurovascular intact distal lower extremities bilateral, varicose veins present, 2+ pulses dorsalis  pedis and posterior tibial. Warm foot and normal cap refill bilateral.  Nursing note and vitals reviewed.   ED Course  Procedures (including critical care time) Labs Review Labs Reviewed - No data to display  Imaging Review US Venous Img Lower Unilateral Right  05/29/2014   CLINICAL DATA:  Right lower extremity calf region pain  EXAM: RIGHT LOWER EXTREMITY VENOUS DUPLEX ULTRASOUND  TECHNIQUE: Gray-scale sonography with graded compression, as well as color Doppler and duplex ultrasound were performed to evaluate the right lower extremity deep venous system from the level of the common femoral vein and including the common femoral, femoral, profunda femoral, popliteal and calf veins including the posterior tibial, peroneal and gastrocnemius veins when visible. The superficial great saphenous vein was also interrogated. Spectral Doppler was utilized to evaluate flow at rest and with distal augmentation maneuvers in the common femoral, femoral and popliteal veins.  COMPARISON:  None.  FINDINGS: Contralateral Common Femoral Vein: Respiratory phasicity is normal and symmetric with the symptomatic side. No evidence of thrombus. Normal compressibility.  Common Femoral Vein: No evidence of thrombus. Normal compressibility, respiratory phasicity and response to augmentation.  Saphenofemoral Junction: No evidence of thrombus. Normal compressibility and flow on color Doppler imaging.  Profunda Femoral Vein: No evidence of thrombus. Normal compressibility and flow on color Doppler imaging.  Femoral Vein: No evidence of thrombus. Normal compressibility, respiratory phasicity and response to augmentation.  Popliteal Vein: No evidence of thrombus. Normal compressibility, respiratory phasicity and response to augmentation.  Calf Veins: No evidence of thrombus. Normal compressibility and flow on color Doppler imaging.  Superficial Great Saphenous Vein: No evidence of thrombus. Normal compressibility and flow on color  Doppler imaging.  Venous Reflux:  None.  Other Findings: There are multiple patent superficial venous varicosities in the right calf region.  IMPRESSION: No evidence of right lower extremity deep venous thrombosis. Left common femoral vein patent. There are multiple patent superficial venous varicosities in the right calf region.   Electronically Signed   By: Lowella Grip III M.D.   On: 05/29/2014 18:05     EKG Interpretation None      MDM   Final diagnoses:  Leg pain, right  Foot pain, bilateral   Clinical concern for arthritis as worse with palpation and walking, no sign of acute ischemic limb. With DVT history plan for ultrasound the right calf and outpatient follow-up. Tylenol for pain. [place] neg dvt.   Results and differential diagnosis were discussed with the patient/parent/guardian. Close follow up outpatient was discussed, comfortable with the plan.   Medications  acetaminophen (TYLENOL) tablet 650 mg (650 mg Oral Given 05/29/14 1606)    Filed Vitals:   05/29/14 1442 05/29/14 1815  BP: 176/74 171/75  Pulse: 67 60  Temp: 98.2 F (36.8 C) 97.3 F (36.3 C)  TempSrc: Oral Oral  Resp: 16 16  Height: 5\' 8"  (1.727 m)   Weight: 192 lb (87.091 kg)  SpO2: 100% 97%    Final diagnoses:  Leg pain, right  Foot pain, bilateral   ,    Elnora Morrison, MD 05/30/14 [phone]

## 2014-05-29 NOTE — ED Notes (Signed)
Pt c/o pulling feeling in r calf and pain in left foot for past few days.  Denies injury.

## 2014-05-29 NOTE — Discharge Instructions (Signed)
If you were given medicines take as directed.  If you are on coumadin or contraceptives realize their levels and effectiveness is altered by many different medicines.  If you have any reaction (rash, tongues swelling, other) to the medicines stop taking and see a physician.   Please follow up as directed and return to the ER or see a physician for new or worsening symptoms.  Thank you. Filed Vitals:   05/29/14 1442  BP: 176/74  Pulse: 67  Temp: 98.2 F (36.8 C)  TempSrc: Oral  Resp: 16  Height: 5\' 8"  (1.727 m)  Weight: 192 lb (87.091 kg)  SpO2: 100%

## 2014-11-05 ENCOUNTER — Encounter: Payer: Self-pay | Admitting: Family Medicine

## 2015-01-02 ENCOUNTER — Emergency Department (HOSPITAL_COMMUNITY): Payer: 59

## 2015-01-02 ENCOUNTER — Emergency Department (HOSPITAL_COMMUNITY)
Admission: EM | Admit: 2015-01-02 | Discharge: 2015-01-02 | Disposition: A | Discharge status: Home / Self Care | Payer: 59 | Attending: Emergency Medicine | Admitting: Emergency Medicine

## 2015-01-02 ENCOUNTER — Encounter (HOSPITAL_COMMUNITY): Payer: Self-pay

## 2015-01-02 DIAGNOSIS — Y9289 Other specified places as the place of occurrence of the external cause: Secondary | ICD-10-CM | POA: Insufficient documentation

## 2015-01-02 DIAGNOSIS — M1990 Unspecified osteoarthritis, unspecified site: Secondary | ICD-10-CM | POA: Insufficient documentation

## 2015-01-02 DIAGNOSIS — Y998 Other external cause status: Secondary | ICD-10-CM | POA: Diagnosis not present

## 2015-01-02 DIAGNOSIS — S92424A Nondisplaced fracture of distal phalanx of right great toe, initial encounter for closed fracture: Secondary | ICD-10-CM | POA: Insufficient documentation

## 2015-01-02 DIAGNOSIS — Y9389 Activity, other specified: Secondary | ICD-10-CM | POA: Insufficient documentation

## 2015-01-02 DIAGNOSIS — W208XXA Other cause of strike by thrown, projected or falling object, initial encounter: Secondary | ICD-10-CM | POA: Diagnosis not present

## 2015-01-02 DIAGNOSIS — I10 Essential (primary) hypertension: Secondary | ICD-10-CM | POA: Insufficient documentation

## 2015-01-02 DIAGNOSIS — Z79899 Other long term (current) drug therapy: Secondary | ICD-10-CM | POA: Insufficient documentation

## 2015-01-02 DIAGNOSIS — S92911A Unspecified fracture of right toe(s), initial encounter for closed fracture: Secondary | ICD-10-CM

## 2015-01-02 DIAGNOSIS — S99921A Unspecified injury of right foot, initial encounter: Secondary | ICD-10-CM | POA: Diagnosis present

## 2015-01-02 MED ORDER — IBUPROFEN 600 MG PO TABS: 600.0000 mg | ORAL_TABLET | Freq: Four times a day (QID) | ORAL | Status: DC | PRN

## 2015-01-02 MED ORDER — IBUPROFEN 800 MG PO TABS
800.0000 mg | ORAL_TABLET | Freq: Once | ORAL | Status: AC
  Administered 2015-01-02: 800 mg via ORAL
  Filled 2015-01-02: qty 1

## 2015-01-02 MED ORDER — ACETAMINOPHEN 325 MG PO TABS
650.0000 mg | ORAL_TABLET | Freq: Once | ORAL | Status: AC
  Administered 2015-01-02: 650 mg via ORAL
  Filled 2015-01-02: qty 2

## 2015-01-02 MED ORDER — HYDROCODONE-ACETAMINOPHEN 5-325 MG PO TABS: 1.0000 | ORAL_TABLET | ORAL | Status: DC | PRN

## 2015-01-02 NOTE — ED Provider Notes (Signed)
CSN: [id number]     Arrival date & time 01/02/15  [id number] History   First MD Initiated Contact with Patient 01/02/15 [phone]     Chief Complaint  Patient presents with  . Toe Pain     (Consider location/radiation/quality/duration/timing/severity/associated sxs/prior Treatment) HPI Comments: Pt has a frozen chicken to slide of the counter and fall on the right first toe 12/4. Pt states the pain is getting worse and she has a "blood blister" on the great toe. No hx of anticoag meds.  Patient is a 59 y.o. female presenting with toe pain. The history is provided by the patient.  Toe Pain This is a new problem. The current episode started in the past 7 days. The problem occurs intermittently. The problem has been gradually worsening. Associated symptoms include arthralgias and joint swelling. Pertinent negatives include no fever, nausea or numbness. The symptoms are aggravated by standing and walking. She has tried ice for the symptoms. The treatment provided no relief.    Past Medical History  Diagnosis Date  . Hypertension   . Heel spur   . Arthritis     OA multiple joints  . Varicose veins    Past Surgical History  Procedure Laterality Date  . Foot surgery    . Abdominal hysterectomy      endometriosis  . Colonoscopy  02/21/2013    [id number] polyp measuring 6 mm in size was found in the distal/transverse colon; polypectomy was performed using snare cautery/The colon was redundant/The colon mucosa was otherwise normal/Normal mucosa in the terminal ileum  . Colonoscopy N/A 02/21/2013    Procedure: COLONOSCOPY;  Surgeon: Danie Binder, MD;  Location: [facility];  Service: Endoscopy;  Laterality: N/A;  10:30 AM   Family History  Problem Relation Age of Onset  . Hypertension Mother   . Cancer Mother   . Diabetes Sister   . Hyperlipidemia Sister   . Hypertension Sister   . Diabetes Brother   . Hypertension Brother    Social History  Substance Use Topics  . Smoking status: Never  Smoker   . Smokeless tobacco: Never Used  . Alcohol Use: No   OB History    No data available     Review of Systems  Constitutional: Negative for fever.  Gastrointestinal: Negative for nausea.  Musculoskeletal: Positive for joint swelling and arthralgias.  Neurological: Negative for numbness.  All other systems reviewed and are negative.     Allergies  Lisinopril  Home Medications   Prior to Admission medications   Medication Sig Start Date End Date Taking? Authorizing Provider  acetaminophen (TYLENOL) 500 MG tablet Take 1,000 mg by mouth every 6 (six) hours as needed.   Yes Historical Provider, MD  amLODipine (NORVASC) 5 MG tablet One tab daily Patient taking differently: Take 5 mg by mouth daily.  03/02/14  Yes Alycia Rossetti, MD  ferrous sulfate (KP FERROUS SULFATE) 325 (65 FE) MG tablet Take 325 mg by mouth daily with breakfast.   Yes Historical Provider, MD  hydrochlorothiazide (HYDRODIURIL) 25 MG tablet Take 1 tablet (25 mg total) by mouth daily. 03/02/14  Yes Alycia Rossetti, MD  omeprazole (PRILOSEC) 20 MG capsule Take 1 capsule (20 mg total) by mouth daily. 08/08/13  Yes Alycia Rossetti, MD  HYDROcodone-acetaminophen (NORCO/VICODIN) 5-325 MG tablet Take 1 tablet by mouth every 4 (four) hours as needed. 01/02/15   Lily Kocher, PA-C  ibuprofen (ADVIL,MOTRIN) 600 MG tablet Take 1 tablet (600 mg total) by mouth every 6 (  six) hours as needed. 01/02/15   Lily Kocher, PA-C  traMADol (ULTRAM) 50 MG tablet Take 2 tablets (100 mg total) by mouth every 8 (eight) hours as needed. Patient not taking: Reported on 05/29/2014 12/23/12   Alycia Rossetti, MD   BP 164/88 mmHg  Pulse 69  Temp(Src) 98.2 F (36.8 C) (Oral)  Resp 18  Ht 5\' 8"  (1.727 m)  Wt 87.091 kg  BMI 29.20 kg/m2  SpO2 100% Physical Exam  Constitutional: She is oriented to person, place, and time. She appears well-developed and well-nourished.  Non-toxic appearance.  HENT:  Head: Normocephalic.  Right Ear:  Tympanic membrane and external ear normal.  Left Ear: Tympanic membrane and external ear normal.  Eyes: EOM and lids are normal. Pupils are equal, round, and reactive to light.  Neck: Normal range of motion. Neck supple. Carotid bruit is not present.  Cardiovascular: Normal rate, regular rhythm, normal heart sounds, intact distal pulses and normal pulses.   Pulmonary/Chest: Breath sounds normal. No respiratory distress.  Abdominal: Soft. Bowel sounds are normal. There is no tenderness. There is no guarding.  Musculoskeletal: Normal range of motion.       Right foot: There is tenderness, bony tenderness, swelling and deformity. There is no laceration.  Pain and swelling of the right great toe. Blood blister on the dorsum of the toe. No lesion of the plantar surface. DP 2+  Lymphadenopathy:       Head (right side): No submandibular adenopathy present.       Head (left side): No submandibular adenopathy present.    She has no cervical adenopathy.  Neurological: She is alert and oriented to person, place, and time. She has normal strength. No cranial nerve deficit or sensory deficit.  Skin: Skin is warm and dry.  Psychiatric: She has a normal mood and affect. Her speech is normal.  Nursing note and vitals reviewed.   ED Course  Procedures (including critical care time)  FRACTURE CARE RIGHT GREAT TOE. Fracture of the right first toe explained to the patient in terms she understands. Precedure explained in terms she understands.  Pt ID by arm band. First and second toe buddy wrapped and taped. Pt fitted with Post Op shoe. Rx for norco and ibuprofen given to the patient. Pt tolerated the procedure without problem. Labs Review Labs Reviewed - No data to display  Imaging Review Dg Toe Great Right  01/02/2015  CLINICAL DATA:  Dropped a pack a chicken on her great toe. EXAM: RIGHT GREAT TOE COMPARISON:  None. FINDINGS: Nondisplaced comminuted fracture of the distal aspect of the first distal  phalanx involving the tuft. No other fracture or dislocation. Soft tissue swelling around the first distal phalanx. IMPRESSION: 1. Nondisplaced comminuted fracture of the distal aspect of the first distal phalanx involving the tuft. Electronically Signed   By: Kathreen Devoid   On: 01/02/2015 09:57   I have personally reviewed and evaluated these images and lab results as part of my medical decision-making.   EKG Interpretation None      MDM  Xray reveals a comminuted fx of the distal toe of the right foot. Pt buddy taped and fitted with wooden shoe. Discussed elevation and need to stay off the foot as much as possible. Pt referred to Dr Caprice Beaver [facility] Rx for norco and ibuprofen given to the patient. Questions answered. Work note provided.   Final diagnoses:  Toe fracture, right, closed, initial encounter    **I have reviewed nursing notes, vital signs,  and all appropriate lab and imaging results for this patient.Lily Kocher, PA-C 01/02/15 [place], MD 01/02/15 1531

## 2015-01-02 NOTE — ED Notes (Signed)
Pt reports she dropped some frozen chicken on r great toe Sunday.  Reports pain, bruising, and swelling.

## 2015-01-02 NOTE — Discharge Instructions (Signed)
Your great toe has multiple fractures. It is important that you see Dr Caprice Beaver or a member of his team as soon as possible. Keep your foot elevated. Use ibuprofen for mild pain, take with food. Use norco for more severe pain. This may cause drowsiness, use with caution. Toe Fracture A toe fracture is a break in one of the toe bones (phalanges). HOME CARE If You Have a Cast:  Do not stick anything inside the cast to scratch your skin.  Check the skin around the cast every day. Tell your doctor about any concerns. Do not put lotion on the skin underneath the cast. You may put lotion on dry skin around the edges of the cast.  Do not put pressure on any part of the cast until it is fully hardened. This may take many hours.  Keep the cast clean and dry. Bathing  Do not take baths, swim, or use a hot tub until your doctor says that you can. Ask your doctor if you can take showers. You may only be allowed to take sponge baths for bathing.  If your doctor says that bathing and showering are okay, cover the cast or bandage (dressing) with a watertight plastic bag to protect it from water. Do not let the cast or bandage get wet. Managing Pain, Stiffness, and Swelling  If you do not have a cast, put ice on the injured area if told by your doctor:  Put ice in a plastic bag.  Place a towel between your skin and the bag.  Leave the ice on for 20 minutes, 2-3 times per day.  Move your toes often to avoid stiffness and to lessen swelling.  Raise (elevate) the injured area above the level of your heart while you are sitting or lying down. Driving  Do not drive or use heavy machinery while taking pain medicine.  Do not drive while wearing a cast on a foot that you use for driving. Activity  Return to your normal activities as told by your doctor. Ask your doctor what activities are safe for you.  Perform exercises daily as told by your doctor or therapist. Safety  Do not use your leg to  support your body weight until your doctor says that you can. Use crutches or other tools to help you move around as told by your doctor. General Instructions  If your toe was taped to a toe that is next to it (buddy taping), follow your doctor's instructions for changing the gauze and tape. Change it more often:  If the gauze and tape get wet. If this happens, dry the space between the toes.  If the gauze and tape are too tight and they cause your toe to become pale or to lose feeling (numb).  Wear a protective shoe as told by your doctor. If you were not given one, wear sturdy shoes that support your foot. Your shoes should not pinch your toes. Your shoes should not fit tightly against your toes.  Do not use any tobacco products, including cigarettes, chewing tobacco, or e-cigarettes. Tobacco can delay bone healing. If you need help quitting, ask your doctor.  Take medicines only as told by your doctor.  Keep all follow-up visits as told by your doctor. This is important. GET HELP IF:  You have a fever.  Your pain medicine is not helping.  Your toe feels cold.  You lose feeling (have numbness) in your toe.  You still have pain after one week of rest  and treatment.  You still have pain after your doctor has said that you can start walking again.  You have pain or tingling in your foot, and it is not going away.  You have loss of feeling in your foot, and it is not going away. GET HELP RIGHT AWAY IF:  You have severe pain.  You have redness or swelling (inflammation) in your toe, and it is getting worse.  You have pain or loss of feeling in your toe, and it is getting worse.  Your toe is blue.   This information is not intended to replace advice given to you by your health care provider. Make sure you discuss any questions you have with your health care provider.   Document Released: 07/01/2007 Document Revised: 05/29/2014 Document Reviewed: 11/08/2013 Elsevier  Interactive Patient Education [employer].

## 2015-01-27 HISTORY — PX: BREAST BIOPSY: SHX20

## 2015-03-26 ENCOUNTER — Ambulatory Visit (INDEPENDENT_AMBULATORY_CARE_PROVIDER_SITE_OTHER): Payer: 59 | Admitting: Family Medicine

## 2015-03-26 ENCOUNTER — Encounter: Payer: Self-pay | Admitting: Family Medicine

## 2015-03-26 VITALS — BP 138/84 | HR 78 | Temp 98.1°F | Resp 12 | Ht 66.0 in | Wt 204.0 lb

## 2015-03-26 DIAGNOSIS — M150 Primary generalized (osteo)arthritis: Secondary | ICD-10-CM

## 2015-03-26 DIAGNOSIS — I10 Essential (primary) hypertension: Secondary | ICD-10-CM

## 2015-03-26 DIAGNOSIS — E669 Obesity, unspecified: Secondary | ICD-10-CM | POA: Diagnosis not present

## 2015-03-26 DIAGNOSIS — M8949 Other hypertrophic osteoarthropathy, multiple sites: Secondary | ICD-10-CM

## 2015-03-26 DIAGNOSIS — M75101 Unspecified rotator cuff tear or rupture of right shoulder, not specified as traumatic: Secondary | ICD-10-CM

## 2015-03-26 DIAGNOSIS — M7501 Adhesive capsulitis of right shoulder: Secondary | ICD-10-CM | POA: Diagnosis not present

## 2015-03-26 DIAGNOSIS — M159 Polyosteoarthritis, unspecified: Secondary | ICD-10-CM

## 2015-03-26 LAB — LIPID PANEL
CHOLESTEROL: 151 mg/dL (ref 125–200)
HDL: 85 mg/dL (ref >=46)
LDL Cholesterol: 59 mg/dL (ref <130)
TRIGLYCERIDES: 37 mg/dL (ref <150)
Total CHOL/HDL Ratio: 1.8 Ratio (ref <=5.0)
VLDL: 7 mg/dL (ref <30)

## 2015-03-26 LAB — CBC WITH DIFFERENTIAL/PLATELET
BASOS PCT: 0 % (ref 0–1)
Basophils Absolute: 0 10*3/uL (ref 0.0–0.1)
Eosinophils Absolute: 0.1 10*3/uL (ref 0.0–0.7)
Eosinophils Relative: 2 % (ref 0–5)
HCT: 36.4 % (ref 36.0–46.0)
HEMOGLOBIN: 11.5 g/dL — AB (ref 12.0–15.0)
Lymphocytes Relative: 36 % (ref 12–46)
Lymphs Abs: 2.3 10*3/uL (ref 0.7–4.0)
MCH: 26.6 pg (ref 26.0–34.0)
MCHC: 31.6 g/dL (ref 30.0–36.0)
MCV: 84.3 fL (ref 78.0–100.0)
MPV: 11.4 fL (ref 8.6–12.4)
Monocytes Absolute: 0.3 10*3/uL (ref 0.1–1.0)
Monocytes Relative: 5 % (ref 3–12)
Neutro Abs: 3.6 10*3/uL (ref 1.7–7.7)
Neutrophils Relative %: 57 % (ref 43–77)
Platelets: 295 10*3/uL (ref 150–400)
RBC: 4.32 MIL/uL (ref 3.87–5.11)
RDW: 13.9 % (ref 11.5–15.5)
WBC: 6.4 10*3/uL (ref 4.0–10.5)

## 2015-03-26 LAB — COMPREHENSIVE METABOLIC PANEL
ALBUMIN: 4.1 g/dL (ref 3.6–5.1)
ALK PHOS: 117 U/L (ref 33–130)
ALT: 8 U/L (ref 6–29)
AST: 12 U/L (ref 10–35)
BUN: 14 mg/dL (ref 7–25)
CO2: 26 mmol/L (ref 20–31)
CREATININE: 0.62 mg/dL (ref 0.50–0.99)
Calcium: 9.4 mg/dL (ref 8.6–10.4)
Chloride: 101 mmol/L (ref 98–110)
Glucose, Bld: 87 mg/dL (ref 70–99)
Potassium: 4.2 mmol/L (ref 3.5–5.3)
SODIUM: 139 mmol/L (ref 135–146)
TOTAL PROTEIN: 7.7 g/dL (ref 6.1–8.1)
Total Bilirubin: 0.3 mg/dL (ref 0.2–1.2)

## 2015-03-26 MED ORDER — HYDROCHLOROTHIAZIDE 25 MG PO TABS: 25.0000 mg | ORAL_TABLET | Freq: Every day | ORAL | Status: DC

## 2015-03-26 MED ORDER — AMLODIPINE BESYLATE 5 MG PO TABS: 5.0000 mg | ORAL_TABLET | Freq: Every day | ORAL | Status: DC

## 2015-03-26 NOTE — Assessment & Plan Note (Signed)
Discussed dietary changes, walking

## 2015-03-26 NOTE — Progress Notes (Signed)
Patient ID: Betty Dawson, female   DOB: 07/03/1955, 60 y.o.   MRN: [id number]   Subjective:    Patient ID: Betty Dawson, female    DOB: 06-26-1955, 60 y.o.   MRN: [id number]  Patient presents for Medication Review/ Refill  patient to follow chronic medical problems. She was last seen about a year ago. She has history of hypertension she's been maintained on amlodipine and hydrochlorothiazide. She also has mild chronic anemia which she takes over-the-counter iron tablets 4. Her main concern is continued issues with her right rotator cuff. She was seen by orthopedics in the past. She now is unable to raise her arm to shoulder height and has been frozen like this for the past few months. 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 At this point she is unable to comb her hair or do many overhead things because of her shoulder.  She has significant generalized osteoarthritis we have tried multiple meds none of them seem to really help therefore she does not want to take anything on a regular basis.    Review Of Systems:  GEN- denies fatigue, fever, weight loss,weakness, recent illness HEENT- denies eye drainage, change in vision, nasal discharge, CVS- denies chest pain, palpitations RESP- denies SOB, cough, wheeze ABD- denies N/V, change in stools, abd pain GU- denies dysuria, hematuria, dribbling, incontinence MSK- + joint pain, muscle aches, injury Neuro- denies headache, dizziness, syncope, seizure activity       Objective:    BP 138/84 mmHg  Pulse 78  Temp(Src) 98.1 F (36.7 C) (Oral)  Resp 12  Ht 5\' 6"  (1.676 m)  Wt 204 lb (92.534 kg)  BMI 32.94 kg/m2 GEN- NAD, alert and oriented x3 HEENT- PERRL, EOMI, non injected sclera, pink conjunctiva, MMM, oropharynx clear Neck- Supple, no thyromegaly CVS- RRR, no murmur RESP-CTAB MSK- Decreased ROM RUE compared to left, pain with  elevation of shoulder, +empty can ABD-NABS,soft,NT,ND EXT- No edema, varicose veins Pulses- Radial 2+        Assessment & Plan:      Problem List Items Addressed This Visit    Rotator cuff syndrome    Worsening rotator cuff syndrome now with evidence of frozen shoulder. I may get her back to orthopedics to be seen      Relevant Orders   Ambulatory referral to Orthopedic Surgery   Obesity (BMI 30-39.9)    Discussed dietary changes, walking      OA (osteoarthritis)    She has significant generalized osteoarthritis she does plan to retire in one year. She takes Tylenol as needed for pain      HTN (hypertension) - Primary    Blood pressure looks okay continue current medications for fasting labs will be done      Relevant Medications   amLODipine (NORVASC) 5 MG tablet   hydrochlorothiazide (HYDRODIURIL) 25 MG tablet   Other Relevant Orders   CBC with Differential/Platelet   Comprehensive metabolic panel   Lipid panel    Other Visit Diagnoses    Frozen shoulder syndrome, right        Relevant Orders    Ambulatory referral to Orthopedic Surgery       Note: This dictation was prepared with Dragon dictation along with smaller phrase technology. Any transcriptional errors that result from this process are unintentional.

## 2015-03-26 NOTE — Assessment & Plan Note (Signed)
She has significant generalized osteoarthritis she does plan to retire in one year. She takes Tylenol as needed for pain

## 2015-03-26 NOTE — Assessment & Plan Note (Signed)
Worsening rotator cuff syndrome now with evidence of frozen shoulder. I may get her back to orthopedics to be seen

## 2015-03-26 NOTE — Patient Instructions (Addendum)
Referral to Dr. Aline Brochure  Work on diet We will call with lab results  F/U 6 months- Physical

## 2015-03-26 NOTE — Assessment & Plan Note (Signed)
Blood pressure looks okay continue current medications for fasting labs will be done

## 2015-03-27 ENCOUNTER — Encounter: Payer: Self-pay | Admitting: *Deleted

## 2015-04-12 ENCOUNTER — Encounter: Payer: Self-pay | Admitting: Orthopedic Surgery

## 2015-04-26 ENCOUNTER — Encounter: Payer: Self-pay | Admitting: Orthopedic Surgery

## 2015-04-26 ENCOUNTER — Ambulatory Visit (INDEPENDENT_AMBULATORY_CARE_PROVIDER_SITE_OTHER): Payer: BLUE CROSS/BLUE SHIELD | Admitting: Orthopedic Surgery

## 2015-04-26 VITALS — Ht 66.0 in

## 2015-04-26 DIAGNOSIS — M12811 Other specific arthropathies, not elsewhere classified, right shoulder: Secondary | ICD-10-CM

## 2015-04-26 DIAGNOSIS — M75101 Unspecified rotator cuff tear or rupture of right shoulder, not specified as traumatic: Secondary | ICD-10-CM | POA: Diagnosis not present

## 2015-04-26 NOTE — Patient Instructions (Signed)
We will obtain pre-certification from the insurer and call you to schedule the study. We will have you follow-up in the office to discuss the results and further treatment plan.

## 2015-04-26 NOTE — Progress Notes (Signed)
Patient ID: Betty Dawson, female   DOB: 1955-10-08, 60 y.o.   MRN: [id number]  Chief Complaint  Patient presents with  . 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 We gave her a subacromial injection she underwent physical therapy she took meloxicam and tramadol and made slight improvement but when she went back to work her pain increased and now she presents with pseudo-palsy of the right shoulder inability to raise her arm above her head and severe pain nonradiating over the right shoulder  Review of Systems  Constitutional: Negative for fever and chills.  Musculoskeletal: Positive for myalgias and joint pain.  Neurological: Negative for tingling and sensory change.    Ht 5\' 6"  (1.676 m)  Physical Exam  Constitutional: She is oriented to person, place, and time. She appears well-developed and well-nourished. No distress.  Cardiovascular: Normal rate and intact distal pulses.   Musculoskeletal:  Left shoulder no tenderness full range of motion stable in apprehension position strength normal skin left arm normal pulses and temperature normal sensation normal  Right shoulder painful crepitation with active range of motion only the 70-80 stability and inferior subluxation test rotator cuff strength 3 out of 5 skin normal pulses normal sensation normal  Neurological: She is alert and oriented to person, place, and time. She has normal reflexes. She exhibits normal muscle tone. Coordination normal.  Skin: Skin is warm and dry. No rash noted. She is not diaphoretic. No erythema. No pallor.  Psychiatric: She has a normal mood and affect. Her behavior is normal. Judgment and thought content normal.    Ortho Exam    ASSESSMENT AND PLAN   X-rays show sclerosis of the undersurface of the acromion cyst formation in the greater tuberosity Shenton's  line of the shoulder is broken suggesting chronic rotator cuff insufficiency  Recommend MRI of the right shoulder. Rotator cuff tear. Surgery may be necessary cuff repair versus reverse shoulder prosthesis

## 2015-05-09 ENCOUNTER — Ambulatory Visit
Admission: RE | Admit: 2015-05-09 | Discharge: 2015-05-09 | Disposition: A | Discharge status: Home / Self Care | Payer: BLUE CROSS/BLUE SHIELD | Source: Ambulatory Visit | Attending: Orthopedic Surgery | Admitting: Orthopedic Surgery

## 2015-05-09 DIAGNOSIS — M75101 Unspecified rotator cuff tear or rupture of right shoulder, not specified as traumatic: Secondary | ICD-10-CM

## 2015-05-22 ENCOUNTER — Ambulatory Visit (INDEPENDENT_AMBULATORY_CARE_PROVIDER_SITE_OTHER): Payer: BLUE CROSS/BLUE SHIELD | Admitting: Orthopedic Surgery

## 2015-05-22 VITALS — BP 165/97 | Ht 66.0 in | Wt 204.0 lb

## 2015-05-22 DIAGNOSIS — M75101 Unspecified rotator cuff tear or rupture of right shoulder, not specified as traumatic: Secondary | ICD-10-CM | POA: Diagnosis not present

## 2015-05-22 NOTE — Progress Notes (Signed)
Patient ID: Betty Dawson, female   DOB: Jun 24, 1955, 60 y.o.   MRN: [id number]  MRI FOLLOW UP  Chief Complaint  Patient presents with  . Follow-up    review MRI right shoulder    HPI Betty Dawson is a 60 y.o. female.   She has a history of chronic shoulder pain was initially treated a year or so ago did okay pain came back treated again with injection and anti-inflammatories and oral pain medications did not improve. MRI shows torn rotator cuff  MRI OF THE right shoulder  ROS pain right shoulder no numbness or tingling weakness noted in the right shoulder and complains of weakness in the right shoulder joint  Physical Exam  Constitutional: She is oriented to person, place, and time. She appears well-developed and well-nourished. No distress.  Cardiovascular: Normal rate and intact distal pulses.   Neurological: She is alert and oriented to person, place, and time. She has normal reflexes. She exhibits normal muscle tone. Coordination normal.  Skin: Skin is warm and dry. No rash noted. She is not diaphoretic. No erythema. No pallor.  Psychiatric: She has a normal mood and affect. Her behavior is normal. Judgment and thought content normal.    Pseudo-palsy right shoulder abduction active only 75 / passive is 110 Rotator cuff supraspinatus grade 3 out of 5 weakness  Data  Independent image interpretation the MRI showed  Retracted rotator cuff tear right shoulder  The report was read as follows   IMPRESSION: 1. Complete tear of the supraspinatus tendon with 3.2 cm of retraction. 2. Moderate tendinosis of the infraspinatus tendon. 3. Mild tendinosis of the intraarticular portion of the long head of the biceps tendon.   The plan is to see the patient in 6 months as she did not want surgery then I recommended. She says she's been a retired next year anyway and can manage without any pain medication at this point

## 2015-05-22 NOTE — Patient Instructions (Signed)
Surgery for Rotator Cuff Tear With Rehab Rotator cuff surgery is only recommended for individuals who have experienced persistent disability for greater than 3 months of non-surgical (conservative) treatment. Surgery is not necessary but is recommended for individuals who experience difficulty completing daily activities or athletes who are unable to compete. Rotator cuff tears do not usually heal without surgical intervention. If left alone small rotator cuff tears usually become larger. Younger athletes who have a rotator cuff tear may be recommended for surgery without attempting conservative rehabilitation. The purpose of surgery is to regain function of the shoulder joint and eliminate pain associated with the injury. In addition to repairing the tendon tear, the surgery often removes a portion of the bony roof of the shoulder (acromion) as well as the chronically thickened and inflamed membrane below the acromion (subacromial bursa). REASONS NOT TO OPERATE   Infection of the shoulder.  Inability to complete a rehabilitation program.  Patients who have other conditions (emotional or psychological) conditions that contribute to their shoulder condition. RISKS AND COMPLICATIONS  Infection.  Re-tear of the rotator cuff tendons or muscles.  Shoulder stiffness and/or weakness.  Inability to compete in athletics.  Acromioclavicular (AC) joint paint.  Risks of surgery: infection, bleeding, nerve damage, or damage to surrounding tissues. TECHNIQUE There are different surgical procedures used to treat rotator cuff tears. The type of procedure depends on the extent of injury as well as the surgeon's preference. All of the surgical techniques for rotator cuff tears have the same goal of repairing the torn tendon, removing part of the acromion, and removing the subacromial bursa. There are two main types of procedures: arthroscopic and open incision. Arthroscopic procedures are usually completed and  you go home the same day as surgery (outpatient). These procedures use multiple small incisions in which tools and a video camera are placed to work on the shoulder. An electric shaver removes the bursa, then a power burr shaves down the portion of the acromion that places pressure on the rotator cuff. Finally the rotator cuff is sewed (sutured) back to the humeral head. Open incision procedures require a larger incision. The deltoid muscle is detached from the acromion and a ligament in the shoulder (coracoacromial) is cut in order for the surgeon to access the rotator cuff. The subacromial bursa is removed as well as part of the acromion to give the rotator cuff room to move freely. The torn tendon is then sutured to the humeral head. After the rotator cuff is repaired, then the deltoid is reattached and the incision is closed up.  RECOVERY   Post-operative care depends on the surgical technique and the preferences of your therapist.  Keep the wound clean and dry for the first 10 to 14 days after surgery.  Keep your shoulder and arm in the sling provided to you for as long as you have been instructed to.  You will be given pain medications by your caregiver.  Passive (without using muscles) shoulder movements may be begun immediately after surgery.  It is important to follow through with you rehabilitation program in order to have the best possible recovery. RETURN TO SPORTS   The rehabilitation period will depend on the sport and position you play as well as the success of the operation.  The minimum recovery period is 6 months.  You must have regained complete shoulder motion and strength before returning to sports. SEEK IMMEDIATE MEDICAL CARE IF:   Any medications produce adverse side effects.  Any complications from surgery  occur:  Pain, numbness, or coldness in the extremity operated upon.  Discoloration of the nail beds (they become blue or gray) of the extremity operated  upon.  Signs of infections (fever, pain, inflammation, redness, or persistent bleeding).

## 2015-05-24 ENCOUNTER — Ambulatory Visit: Payer: BLUE CROSS/BLUE SHIELD | Admitting: Orthopedic Surgery

## 2015-09-25 ENCOUNTER — Encounter: Payer: BLUE CROSS/BLUE SHIELD | Admitting: Family Medicine

## 2015-09-26 ENCOUNTER — Other Ambulatory Visit: Payer: Self-pay | Admitting: Family Medicine

## 2015-10-22 ENCOUNTER — Ambulatory Visit (INDEPENDENT_AMBULATORY_CARE_PROVIDER_SITE_OTHER): Payer: BLUE CROSS/BLUE SHIELD | Admitting: Family Medicine

## 2015-10-22 ENCOUNTER — Encounter: Payer: Self-pay | Admitting: Family Medicine

## 2015-10-22 VITALS — BP 136/70 | HR 82 | Temp 98.1°F | Resp 14 | Ht 66.0 in | Wt 188.0 lb

## 2015-10-22 DIAGNOSIS — Z0000 Encounter for general adult medical examination without abnormal findings: Secondary | ICD-10-CM | POA: Diagnosis not present

## 2015-10-22 DIAGNOSIS — Z1159 Encounter for screening for other viral diseases: Secondary | ICD-10-CM | POA: Diagnosis not present

## 2015-10-22 DIAGNOSIS — Z1239 Encounter for other screening for malignant neoplasm of breast: Secondary | ICD-10-CM

## 2015-10-22 DIAGNOSIS — M150 Primary generalized (osteo)arthritis: Secondary | ICD-10-CM | POA: Diagnosis not present

## 2015-10-22 DIAGNOSIS — I10 Essential (primary) hypertension: Secondary | ICD-10-CM | POA: Diagnosis not present

## 2015-10-22 DIAGNOSIS — M8949 Other hypertrophic osteoarthropathy, multiple sites: Secondary | ICD-10-CM

## 2015-10-22 DIAGNOSIS — I868 Varicose veins of other specified sites: Secondary | ICD-10-CM

## 2015-10-22 DIAGNOSIS — Z23 Encounter for immunization: Secondary | ICD-10-CM | POA: Diagnosis not present

## 2015-10-22 DIAGNOSIS — E669 Obesity, unspecified: Secondary | ICD-10-CM | POA: Diagnosis not present

## 2015-10-22 DIAGNOSIS — M75101 Unspecified rotator cuff tear or rupture of right shoulder, not specified as traumatic: Secondary | ICD-10-CM | POA: Diagnosis not present

## 2015-10-22 DIAGNOSIS — M159 Polyosteoarthritis, unspecified: Secondary | ICD-10-CM

## 2015-10-22 DIAGNOSIS — I8390 Asymptomatic varicose veins of unspecified lower extremity: Secondary | ICD-10-CM

## 2015-10-22 LAB — CBC WITH DIFFERENTIAL/PLATELET
BASOS ABS: 0 {cells}/uL (ref 0–200)
Basophils Relative: 0 %
Eosinophils Absolute: 108 cells/uL (ref 15–500)
Eosinophils Relative: 2 %
HEMATOCRIT: 35.3 % (ref 35.0–45.0)
HEMOGLOBIN: 11.3 g/dL — AB (ref 12.0–15.0)
LYMPHS ABS: 1728 {cells}/uL (ref 850–3900)
Lymphocytes Relative: 32 %
MCH: 27.1 pg (ref 27.0–33.0)
MCHC: 32 g/dL (ref 32.0–36.0)
MCV: 84.7 fL (ref 80.0–100.0)
MONO ABS: 378 {cells}/uL (ref 200–950)
MPV: 11.5 fL (ref 7.5–12.5)
Monocytes Relative: 7 %
NEUTROS ABS: 3186 {cells}/uL (ref 1500–7800)
Neutrophils Relative %: 59 %
Platelets: 275 10*3/uL (ref 140–400)
RBC: 4.17 MIL/uL (ref 3.80–5.10)
RDW: 12.9 % (ref 11.0–15.0)
WBC: 5.4 10*3/uL (ref 3.8–10.8)

## 2015-10-22 LAB — COMPREHENSIVE METABOLIC PANEL
ALBUMIN: 3.8 g/dL (ref 3.6–5.1)
ALT: 9 U/L (ref 6–29)
AST: 14 U/L (ref 10–35)
Alkaline Phosphatase: 87 U/L (ref 33–130)
BUN: 11 mg/dL (ref 7–25)
CHLORIDE: 99 mmol/L (ref 98–110)
CO2: 32 mmol/L — ABNORMAL HIGH (ref 20–31)
Calcium: 9.4 mg/dL (ref 8.6–10.4)
Creat: 0.52 mg/dL (ref 0.50–0.99)
Glucose, Bld: 99 mg/dL (ref 70–99)
Potassium: 3.9 mmol/L (ref 3.5–5.3)
Sodium: 140 mmol/L (ref 135–146)
TOTAL PROTEIN: 7.1 g/dL (ref 6.1–8.1)
Total Bilirubin: 0.2 mg/dL (ref 0.2–1.2)

## 2015-10-22 LAB — LIPID PANEL
CHOLESTEROL: 116 mg/dL — AB (ref 125–200)
HDL: 61 mg/dL (ref >=46)
LDL Cholesterol: 44 mg/dL (ref <130)
TRIGLYCERIDES: 56 mg/dL (ref <150)
Total CHOL/HDL Ratio: 1.9 Ratio (ref <=5.0)
VLDL: 11 mg/dL (ref <30)

## 2015-10-22 NOTE — Patient Instructions (Signed)
Schedule mammogram Flu shot and shingles shot  Sign for release from [facility] for xrays  We will call with lab results  F/U 6 MONTHS

## 2015-10-22 NOTE — Progress Notes (Signed)
Subjective:    Patient ID: Betty Dawson, female    DOB: [birth year], 60 y.o.   MRN: [id number]  Patient presents for CPE (is not fasting) Patient here for complete physical exam she's been seen by disability provider and she is stop working rostrally month ago. She has generalized osteoarthritis along with chronic right shoulder pain which needs surgical repair of a rotator cuff tear. 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 She takes Tylenol as needed for her aches and pains. She does have a follow-up with orthopedics next month to discuss the surgery for her shoulder. Other things contributing to her disability include her varicose veins and intermittent leg swelling which she does use hydrochlorothiazide 4.  She is taking her blood pressure medicine as prescribed she does state that she changed her diet as her grandchildren have not been around as much and her weight is down 16 pounds since February.  Due for mammogram Discussed HIV and Hep C testing Colonoscopy UTD  Does not have eye insurance currently   Flu shot due    Review Of Systems:  GEN- denies fatigue, fever, weight loss,weakness, recent illness HEENT- denies eye drainage, change in vision, nasal discharge, CVS- denies chest pain, palpitations RESP- denies SOB, cough, wheeze ABD- denies N/V, change in stools, abd pain GU- denies dysuria, hematuria, dribbling, incontinence MSK- +joint pain, muscle aches, injury Neuro- denies headache, dizziness, syncope, seizure activity       Objective:    BP 136/70 (BP Location: Left Arm, Patient Position: Sitting, Cuff Size: Large)   Pulse 82   Temp 98.1 F (36.7 C) (Oral)   Resp 14   Ht 5\' 6"  (1.676 m)   Wt 188 lb (85.3 kg)   BMI 30.34 kg/m  GEN- NAD, alert and oriented x3 HEENT- PERRL, EOMI, non injected sclera, pink conjunctiva, MMM, oropharynx clear Neck- Supple, no  thyromegaly, no bruit  CVS- RRR, no murmur RESP-CTAB ABD-NABS,soft,NT,ND MSK- Decreased ROM Lumbar spine and bilat hips, neg SLR EXT- varicose veins, trace pedal edema  Pulses- Radial, DP- 2+        Assessment & Plan:      Problem List Items Addressed This Visit    Varicose veins   Rotator cuff syndrome   Obesity (BMI 30-39.9)   OA (osteoarthritis)    Generalized osteoarthritis affecting multiple joints. This is caused her debility throughout her years as she continued to work. She also has chronic right shoulder pain with a tear in her rotator cuff which will need surgical intervention. She's been having increased pain in her right hip and had x-rays done therefore will obtain these to evaluate. She takes Tylenol as needed for pain.  Congratulated on her weight she's been some changes with her diet      HTN (hypertension)   Relevant Orders   Comprehensive metabolic panel   Lipid panel    Other Visit Diagnoses    Routine general medical examination at a health care facility    -  Primary   CPE done, flu and shingles shot given, fasting labs, pt to schedule mammogram   Relevant Orders   HIV antibody   CBC with Differential/Platelet   Comprehensive metabolic panel   Lipid panel   Breast cancer screening       Relevant Orders   MM DIGITAL SCREENING BILATERAL   Need for hepatitis C screening test  Relevant Orders   Hepatitis C antibody      Note: This dictation was prepared with Dragon dictation along with smaller phrase technology. Any transcriptional errors that result from this process are unintentional.

## 2015-10-22 NOTE — Assessment & Plan Note (Signed)
Generalized osteoarthritis affecting multiple joints. This is caused her debility throughout her years as she continued to work. She also has chronic right shoulder pain with a tear in her rotator cuff which will need surgical intervention. She's been having increased pain in her right hip and had x-rays done therefore will obtain these to evaluate. She takes Tylenol as needed for pain.  Congratulated on her weight she's been some changes with her diet

## 2015-10-23 LAB — HEPATITIS C ANTIBODY: HCV Ab: NEGATIVE

## 2015-10-23 LAB — HIV ANTIBODY (ROUTINE TESTING W REFLEX): HIV 1&2 Ab, 4th Generation: NONREACTIVE

## 2015-10-24 ENCOUNTER — Encounter: Payer: Self-pay | Admitting: *Deleted

## 2015-10-27 ENCOUNTER — Other Ambulatory Visit: Payer: Self-pay | Admitting: Family Medicine

## 2015-11-13 ENCOUNTER — Other Ambulatory Visit: Payer: Self-pay | Admitting: Family Medicine

## 2015-11-13 DIAGNOSIS — R928 Other abnormal and inconclusive findings on diagnostic imaging of breast: Secondary | ICD-10-CM

## 2015-11-13 DIAGNOSIS — N6310 Unspecified lump in the right breast, unspecified quadrant: Secondary | ICD-10-CM

## 2015-11-18 ENCOUNTER — Encounter: Payer: Self-pay | Admitting: Orthopedic Surgery

## 2015-11-19 ENCOUNTER — Ambulatory Visit (HOSPITAL_COMMUNITY)
Admission: RE | Admit: 2015-11-19 | Discharge: 2015-11-19 | Disposition: A | Discharge status: Home / Self Care | Payer: BLUE CROSS/BLUE SHIELD | Source: Ambulatory Visit | Attending: Family Medicine | Admitting: Family Medicine

## 2015-11-19 DIAGNOSIS — N6310 Unspecified lump in the right breast, unspecified quadrant: Secondary | ICD-10-CM

## 2015-11-19 DIAGNOSIS — R928 Other abnormal and inconclusive findings on diagnostic imaging of breast: Secondary | ICD-10-CM

## 2015-11-20 ENCOUNTER — Ambulatory Visit: Payer: BLUE CROSS/BLUE SHIELD | Admitting: Orthopedic Surgery

## 2015-11-20 ENCOUNTER — Other Ambulatory Visit: Payer: Self-pay | Admitting: Family Medicine

## 2015-11-20 ENCOUNTER — Telehealth: Payer: Self-pay | Admitting: Family Medicine

## 2015-11-20 DIAGNOSIS — R921 Mammographic calcification found on diagnostic imaging of breast: Secondary | ICD-10-CM

## 2015-11-20 NOTE — Telephone Encounter (Signed)
Izora Gala from [facility] calling to request an order for patient. She states patient had a diagnostic mammogram and US done yesterday and they need an additional order. CB# [phone] please ask for Izora Gala

## 2015-11-20 NOTE — Telephone Encounter (Signed)
Order has been faxed to office to have MD sign.

## 2015-11-22 ENCOUNTER — Other Ambulatory Visit: Payer: Self-pay | Admitting: Family Medicine

## 2015-11-22 DIAGNOSIS — R921 Mammographic calcification found on diagnostic imaging of breast: Secondary | ICD-10-CM

## 2015-12-02 ENCOUNTER — Ambulatory Visit
Admission: RE | Admit: 2015-12-02 | Discharge: 2015-12-02 | Disposition: A | Discharge status: Home / Self Care | Payer: BLUE CROSS/BLUE SHIELD | Source: Ambulatory Visit | Attending: Family Medicine | Admitting: Family Medicine

## 2015-12-02 DIAGNOSIS — R921 Mammographic calcification found on diagnostic imaging of breast: Secondary | ICD-10-CM

## 2015-12-04 ENCOUNTER — Other Ambulatory Visit: Payer: Self-pay

## 2015-12-04 ENCOUNTER — Other Ambulatory Visit: Payer: Self-pay | Admitting: Family Medicine

## 2015-12-04 DIAGNOSIS — R921 Mammographic calcification found on diagnostic imaging of breast: Secondary | ICD-10-CM

## 2015-12-09 ENCOUNTER — Other Ambulatory Visit: Payer: Self-pay | Admitting: Family Medicine

## 2015-12-09 DIAGNOSIS — R921 Mammographic calcification found on diagnostic imaging of breast: Secondary | ICD-10-CM

## 2015-12-10 ENCOUNTER — Ambulatory Visit
Admission: RE | Admit: 2015-12-10 | Discharge: 2015-12-10 | Disposition: A | Discharge status: Home / Self Care | Payer: BLUE CROSS/BLUE SHIELD | Source: Ambulatory Visit | Attending: Family Medicine | Admitting: Family Medicine

## 2015-12-10 DIAGNOSIS — R921 Mammographic calcification found on diagnostic imaging of breast: Secondary | ICD-10-CM

## 2015-12-11 ENCOUNTER — Ambulatory Visit (INDEPENDENT_AMBULATORY_CARE_PROVIDER_SITE_OTHER): Payer: BLUE CROSS/BLUE SHIELD | Admitting: Orthopedic Surgery

## 2015-12-11 ENCOUNTER — Encounter: Payer: Self-pay | Admitting: Orthopedic Surgery

## 2015-12-11 DIAGNOSIS — M75121 Complete rotator cuff tear or rupture of right shoulder, not specified as traumatic: Secondary | ICD-10-CM | POA: Diagnosis not present

## 2015-12-11 NOTE — Progress Notes (Signed)
Patient ID: Betty Dawson, female   DOB: 1955/09/08, 61 y.o.   MRN: [id number]  Chief Complaint  Patient presents with  . Follow-up    Rotator cuff tear discuss possible surgery     HPI Betty Dawson is a 60 y.o. female.   HPI 60 year old female with diagnosis rotator cuff tear she's had an MRI as an adequate nonoperative treatment. She was to discuss possible repair today. However she had a biopsy of the breast however the biopsy came back to normal she will need a 6 month follow-up.  After discussion she agrees to proceed with arthroscopic rotator cuff repair  Review of Systems Review of Systems Normal neuro  Denies fever  Past Medical History:  Diagnosis Date  . Arthritis    OA multiple joints  . Heel spur   . Hypertension   . Varicose veins      Examination There were no vitals taken for this visit.  Gen. appearance the patient's appearance is normal with normal grooming and  hygiene The patient is oriented to person place and time Mood and affect are normal   Ortho Exam Gait is remarkable for weakness and painful [place] elevation of the right shoulder with a grade 3 weakness of the supraspinatus tendon she has mild tenderness around the anterior acromion anterior deltoid with decreased range of motion in all planes. Stability tests are normal  Skin is normal (no rash or erythema)   On the left side she has full range of motion his shoulder is stable she has normal strength skin looks good bilaterally. No neurovascular deficits in either arm and radial pulses normal bilaterally   Medical decision-making Diagnosis, Data, Plan (risk)  MRI report was read as IMPRESSION: 1. Complete tear of the supraspinatus tendon with 3.2 cm of retraction. 2. Moderate tendinosis of the infraspinatus tendon. 3. Mild tendinosis of the intraarticular portion of the long head of the biceps tendon.  I reviewed the MRI as MRI shows a torn rotator cuff  Plan is for rotator cuff  repair arthroscopically  This procedure has been fully reviewed with the patient and written informed consent has been obtained.   Arther Abbott, MD 12/11/2015 12:06 PM

## 2015-12-11 NOTE — Addendum Note (Signed)
Addended by: Baldomero Lamy B on: 12/11/2015 03:06 PM   Modules accepted: Orders, SmartSet

## 2016-01-23 ENCOUNTER — Encounter: Payer: Self-pay | Admitting: *Deleted

## 2016-01-23 NOTE — Patient Instructions (Signed)
Betty Dawson  01/23/2016     @PREFPERIOPPHARMACY @   Your procedure is scheduled on  01/30/2016   Report to [facility] at  950  A.M.  Call this number if you have problems the morning of surgery:  [phone]   Remember:  Do not eat food or drink liquids after midnight.  Take these medicines the morning of surgery with A SIP OF WATER  norvasc   Do not wear jewelry, make-up or nail polish.  Do not wear lotions, powders, or perfumes, or deoderant.  Do not shave 48 hours prior to surgery.  Men may shave face and neck.  Do not bring valuables to the hospital.  [facility] is not responsible for any belongings or valuables.  Contacts, dentures or bridgework may not be worn into surgery.  Leave your suitcase in the car.  After surgery it may be brought to your room.  For patients admitted to the hospital, discharge time will be determined by your treatment team.  Patients discharged the day of surgery will not be allowed to drive home.   Name and phone number of your driver:   family Special instructions:  none  Please read over the following fact sheets that you were given. Anesthesia Post-op Instructions and Care and Recovery After Surgery       Surgery for Rotator Cuff Tear, Care After Refer to this sheet in the next few weeks. These instructions provide you with information about caring for yourself after your procedure. Your health care provider may also give you more specific instructions. Your treatment has been planned according to current medical practices, but problems sometimes occur. Call your health care provider if you have any problems or questions after your procedure. What can I expect after the procedure? After the procedure, it is common to have:  Swelling.  Pain.  Stiffness.  Tenderness. Follow these instructions at home: If you have a sling:  Wear the sling as told by your health care provider. Remove it only as told by your health care  provider.  Loosen the sling if your fingers tingle, become numb, or turn cold and blue.  Do not let your sling get wet if it is not waterproof.  Keep the sling clean. Bathing  Do not take baths, swim, or use a hot tub until your health care provider approves. Ask your health care provider if you can take showers. You may only be allowed to take sponge baths for bathing.  Keep your bandage (dressing) dry until your health care provider says it can be removed. Incision care  Follow instructions from your health care provider about how to take care of your incision. Make sure you:  Wash your hands with soap and water before you change your dressing. If soap and water are not available, use hand sanitizer.  Change your dressing as told by your health care provider.  Leave stitches (sutures), skin glue, or adhesive strips in place. These skin closures may need to stay in place for 2 weeks or longer. If adhesive strip edges start to loosen and curl up, you may trim the loose edges. Do not remove adhesive strips completely unless your health care provider tells you to do that.  Check your incision area every day for signs of infection. Check for:  More redness, swelling, or pain.  More fluid or blood.  Warmth.  Pus or a bad smell. Managing pain, stiffness, and swelling  If directed, put ice  on your shoulder area.  Put ice in a plastic bag.  Place a towel between your skin and the bag.  Leave the ice on for 20 minutes, 2-3 times a day.  Move your fingers often to avoid stiffness and to lessen swelling.  Raise (elevate) your upper body on pillows when you lie down and when you sleep.  Do not sleep on the front of your body (abdomen).  Do not sleep on the side that your surgery was performed on. Driving  Do not drive for 24 hours if you received a medicine to help you relax (sedative) during your procedure.  Do not drive or operate heavy machinery while taking prescription  pain medicine.  Ask your health care provider when it is safe for you to drive. Activity  Do not use your arm to support your body weight until your health care provider approves.  Do not lift or hold anything with your arm until your health care provider approves.  Return to your normal activities as told by your health care provider. Ask your health care provider what activities are safe for you.  Do exercises as told by your health care provider. General instructions  Do not use any tobacco products, such as cigarettes, chewing tobacco, or e-cigarettes. Tobacco can delay healing. If you need help quitting, ask your health care provider.  Take over-the-counter and prescription medicines only as told by your health care provider.  If you were prescribed an antibiotic medicine, take it as told by your health care provider. Do not stop taking the antibiotic even if you start to feel better.  Keep all follow-up visits as told by your health care provider. This is important. Contact a health care provider if:  You have a fever.  You have more redness, swelling, or pain around your incision.  You have more fluid or blood coming from your incision.  Your incision feels warm to the touch.  You have pus or a bad smell coming from your incision.  You have pain that gets worse or does not get better with medicine. Get help right away if:  You have severe pain.  You lose feeling in your arm or hand.  Your hand or fingers turn very pale or blue. This information is not intended to replace advice given to you by your health care provider. Make sure you discuss any questions you have with your health care provider. Document Released: 01/12/2005 Document Revised: 09/18/2015 Document Reviewed: 01/26/2015 Elsevier Interactive Patient Education  2017 [employer] PATIENT INSTRUCTIONS POST-ANESTHESIA  IMMEDIATELY FOLLOWING SURGERY:  Do not drive or operate machinery for the first  twenty four hours after surgery.  Do not make any important decisions for twenty four hours after surgery or while taking narcotic pain medications or sedatives.  If you develop intractable nausea and vomiting or a severe headache please notify your doctor immediately.  FOLLOW-UP:  Please make an appointment with your surgeon as instructed. You do not need to follow up with anesthesia unless specifically instructed to do so.  WOUND CARE INSTRUCTIONS (if applicable):  Keep a dry clean dressing on the anesthesia/puncture wound site if there is drainage.  Once the wound has quit draining you may leave it open to air.  Generally you should leave the bandage intact for twenty four hours unless there is drainage.  If the epidural site drains for more than 36-48 hours please call the anesthesia department.  QUESTIONS?:  Please feel free to call your physician or the  hospital operator if you have any questions, and they will be happy to assist you.

## 2016-01-23 NOTE — Progress Notes (Signed)
SURGICAL PRE AUTHORIZATION NOT REQUIRED FOR CPT PER [employer] REFERENCE JACOBIA C 01/23/16 3:26PM

## 2016-01-28 ENCOUNTER — Other Ambulatory Visit: Payer: Self-pay | Admitting: Orthopedic Surgery

## 2016-01-28 ENCOUNTER — Encounter (HOSPITAL_COMMUNITY): Payer: Self-pay

## 2016-01-28 ENCOUNTER — Encounter (HOSPITAL_COMMUNITY)
Admission: RE | Admit: 2016-01-28 | Discharge: 2016-01-28 | Disposition: A | Discharge status: Home / Self Care | Payer: BLUE CROSS/BLUE SHIELD | Source: Ambulatory Visit | Attending: Orthopedic Surgery | Admitting: Orthopedic Surgery

## 2016-01-28 DIAGNOSIS — Z888 Allergy status to other drugs, medicaments and biological substances status: Secondary | ICD-10-CM | POA: Diagnosis not present

## 2016-01-28 DIAGNOSIS — I10 Essential (primary) hypertension: Secondary | ICD-10-CM | POA: Diagnosis not present

## 2016-01-28 DIAGNOSIS — R011 Cardiac murmur, unspecified: Secondary | ICD-10-CM | POA: Diagnosis not present

## 2016-01-28 DIAGNOSIS — M7581 Other shoulder lesions, right shoulder: Secondary | ICD-10-CM | POA: Diagnosis not present

## 2016-01-28 DIAGNOSIS — M75121 Complete rotator cuff tear or rupture of right shoulder, not specified as traumatic: Secondary | ICD-10-CM | POA: Diagnosis not present

## 2016-01-28 DIAGNOSIS — M1990 Unspecified osteoarthritis, unspecified site: Secondary | ICD-10-CM | POA: Diagnosis not present

## 2016-01-28 DIAGNOSIS — I739 Peripheral vascular disease, unspecified: Secondary | ICD-10-CM | POA: Diagnosis not present

## 2016-01-28 DIAGNOSIS — M65811 Other synovitis and tenosynovitis, right shoulder: Secondary | ICD-10-CM | POA: Diagnosis not present

## 2016-01-28 DIAGNOSIS — M7551 Bursitis of right shoulder: Secondary | ICD-10-CM | POA: Diagnosis not present

## 2016-01-28 DIAGNOSIS — Z90710 Acquired absence of both cervix and uterus: Secondary | ICD-10-CM | POA: Diagnosis not present

## 2016-01-28 HISTORY — DX: Cardiac murmur, unspecified: R01.1

## 2016-01-28 HISTORY — DX: Anemia, unspecified: D64.9

## 2016-01-28 LAB — BASIC METABOLIC PANEL
ANION GAP: 7 (ref 5–15)
BUN: 11 mg/dL (ref 6–20)
CALCIUM: 9 mg/dL (ref 8.9–10.3)
CHLORIDE: 98 mmol/L — AB (ref 101–111)
CO2: 34 mmol/L — AB (ref 22–32)
CREATININE: 0.57 mg/dL (ref 0.44–1.00)
GFR calc non Af Amer: >60 mL/min (ref >60)
Glucose, Bld: 103 mg/dL — ABNORMAL HIGH (ref 65–99)
Potassium: 3 mmol/L — ABNORMAL LOW (ref 3.5–5.1)
SODIUM: 139 mmol/L (ref 135–145)

## 2016-01-28 LAB — CBC WITH DIFFERENTIAL/PLATELET
BASOS ABS: 0 10*3/uL (ref 0.0–0.1)
BASOS PCT: 1 %
EOS ABS: 0.1 10*3/uL (ref 0.0–0.7)
Eosinophils Relative: 3 %
HEMATOCRIT: 38.8 % (ref 36.0–46.0)
HEMOGLOBIN: 12.3 g/dL (ref 12.0–15.0)
Lymphocytes Relative: 38 %
Lymphs Abs: 1.9 10*3/uL (ref 0.7–4.0)
MCH: 28.2 pg (ref 26.0–34.0)
MCHC: 31.7 g/dL (ref 30.0–36.0)
MCV: 89 fL (ref 78.0–100.0)
MONOS PCT: 7 %
Monocytes Absolute: 0.3 10*3/uL (ref 0.1–1.0)
NEUTROS ABS: 2.7 10*3/uL (ref 1.7–7.7)
NEUTROS PCT: 53 %
Platelets: 287 10*3/uL (ref 150–400)
RBC: 4.36 MIL/uL (ref 3.87–5.11)
RDW: 13.2 % (ref 11.5–15.5)
WBC: 5 10*3/uL (ref 4.0–10.5)

## 2016-01-28 MED ORDER — POTASSIUM CHLORIDE CRYS ER 20 MEQ PO TBCR: 20.0000 meq | EXTENDED_RELEASE_TABLET | Freq: Two times a day (BID) | ORAL | 0 refills | Status: DC

## 2016-01-28 NOTE — Pre-Procedure Instructions (Signed)
Potassium of 3.0 called to Dr Ruthe Mannan office. Left essage for them to call us with treatment plan.

## 2016-01-29 ENCOUNTER — Telehealth: Payer: Self-pay | Admitting: Orthopedic Surgery

## 2016-01-29 NOTE — Telephone Encounter (Signed)
Kim left message on voicemail and I really couldn't understand everything she said.   Please call and advise.

## 2016-01-29 NOTE — H&P (Addendum)
Betty Dawson is an 61 y.o. female.    Chief Complaint: Right shoulder pain  HPI: 61 year old female presented to me 2 years ago with atraumatic onset of right shoulder pain. She had initial evaluation and treatment including subacromial injection, anti-inflammatory medication and physical therapy. She presented back this past year recurrent symptoms of pain decreased range of motion weakness in the right shoulder which became severe. Nonoperative treatment was reinstituted but she failed to improve and was sent for MRI.  IMPRESSION: 1. Complete tear of the supraspinatus tendon with 3.2 cm of retraction. 2. Moderate tendinosis of the infraspinatus tendon. 3. Mild tendinosis of the intraarticular portion of the long head of the biceps tendon. Electronically Signed   By: Kathreen Devoid   On: 05/09/2015 10:54   Based on the MRI findings clinical findings including physical exam findings the patient opted for surgical treatment rather than continue with nonoperative treatment which was not working   Past Medical History:  Diagnosis Date  . Anemia   . Arthritis    OA multiple joints  . Heart murmur   . Heel spur   . Hypertension   . Varicose veins     Past Surgical History:  Procedure Laterality Date  . ABDOMINAL HYSTERECTOMY     endometriosis  . COLONOSCOPY  02/21/2013   JFH:LKTGYB polyp measuring 6 mm in size was found in the distal/transverse colon; polypectomy was performed using snare cautery/The colon was redundant/The colon mucosa was otherwise normal/Normal mucosa in the terminal ileum  . COLONOSCOPY N/A 02/21/2013   Procedure: COLONOSCOPY;  Surgeon: Danie Binder, MD;  Location: [facility];  Service: Endoscopy;  Laterality: N/A;  10:30 AM  . FOOT SURGERY Right    heel spur    Family History  Problem Relation Age of Onset  . Hypertension Mother   . Cancer Mother   . Diabetes Sister   . Hyperlipidemia Sister   . Hypertension Sister   . Diabetes Brother   .  Hypertension Brother    Social History:  reports that she has never smoked. She has never used smokeless tobacco. She reports that she does not drink alcohol or use drugs.  Allergies:  Allergies  Allergen Reactions  . Lisinopril Swelling    No prescriptions prior to admission.    Results for orders placed or performed during the hospital encounter of 01/28/16 (from the past 48 hour(s))  CBC with Differential/Platelet     Status: None   Collection Time: 01/28/16 10:10 AM  Result Value Ref Range   WBC 5.0 4.0 - 10.5 K/uL   RBC 4.36 3.87 - 5.11 MIL/uL   Hemoglobin 12.3 12.0 - 15.0 g/dL   HCT 38.8 36.0 - 46.0 %   MCV 89.0 78.0 - 100.0 fL   MCH 28.2 26.0 - 34.0 pg   MCHC 31.7 30.0 - 36.0 g/dL   RDW 13.2 11.5 - 15.5 %   Platelets 287 150 - 400 K/uL   Neutrophils Relative % 53 %   Neutro Abs 2.7 1.7 - 7.7 K/uL   Lymphocytes Relative 38 %   Lymphs Abs 1.9 0.7 - 4.0 K/uL   Monocytes Relative 7 %   Monocytes Absolute 0.3 0.1 - 1.0 K/uL   Eosinophils Relative 3 %   Eosinophils Absolute 0.1 0.0 - 0.7 K/uL   Basophils Relative 1 %   Basophils Absolute 0.0 0.0 - 0.1 K/uL  Basic metabolic panel     Status: Abnormal   Collection Time: 01/28/16 10:10 AM  Result Value Ref Range   Sodium 139 135 - 145 mmol/L   Potassium 3.0 (L) 3.5 - 5.1 mmol/L   Chloride 98 (L) 101 - 111 mmol/L   CO2 34 (H) 22 - 32 mmol/L   Glucose, Bld 103 (H) 65 - 99 mg/dL   BUN 11 6 - 20 mg/dL   Creatinine, Ser 0.57 0.44 - 1.00 mg/dL   Calcium 9.0 8.9 - 10.3 mg/dL   GFR calc non Af Amer >60 >60 mL/min   GFR calc Af Amer >60 >60 mL/min    Comment: (NOTE) The eGFR has been calculated using the CKD EPI equation. This calculation has not been validated in all clinical situations. eGFR's persistently <60 mL/min signify possible Chronic Kidney Disease.    Anion gap 7 5 - 15   No results found.  Review of Systems  Constitutional: Negative for chills, fever and weight loss.  Respiratory: Negative for shortness  of breath.   Cardiovascular: Negative for chest pain.  Neurological: Negative for tingling.  All other systems reviewed and are negative.   There were no vitals taken for this visit. Physical Exam  Constitutional: She is oriented to person, place, and time. She appears well-developed and well-nourished. No distress.  HENT:  Head: Normocephalic and atraumatic.  Eyes: Pupils are equal, round, and reactive to light. Right eye exhibits no discharge. Left eye exhibits no discharge. No scleral icterus.  Neck: Normal range of motion. Neck supple. No JVD present. No tracheal deviation present. No thyromegaly present.  Cardiovascular: Normal rate and intact distal pulses.   Respiratory: Effort normal. No stridor.  GI: Soft. She exhibits no distension.  Musculoskeletal:  Gait normal   Right shoulder exhibits tenderness in the anterior lateral acromion and rotator interval was decreased active range of motion weakness in the supraspinatus tendon but no instability. The internal and external rotators are otherwise normal and strength manual muscle testing. The skin  over the shoulder remains normal   passive range of motion is improved with a positive impingement sign.  Left shoulder normal alignment range of motion stability and strength. Skin normal.  Both upper extremities exhibit normal neurovascular exam and no lymphadenopathy  Lymphadenopathy:    She has no cervical adenopathy.  Neurological: She is alert and oriented to person, place, and time. She has normal reflexes. She exhibits normal muscle tone. Coordination normal.  Skin: Skin is warm and dry. No rash noted. She is not diaphoretic. No erythema. No pallor.  Psychiatric: She has a normal mood and affect. Her behavior is normal. Thought content normal.     Assessment/Plan Torn right rotator cuff tear without significant muscle atrophy although significant retraction  Arthroscopic rotator cuff repair right shoulder  This procedure  has been fully reviewed with the patient and written informed consent has been obtained.   Arther Abbott, MD 01/29/2016, 1:04 PM

## 2016-01-29 NOTE — Pre-Procedure Instructions (Signed)
Potassium of 3.0 shown to Dr Patsey Berthold. Patient on 20 mEq BID. Per Dr Patsey Berthold, patient called and instructed to take 40 mEq with three meals today and will recheck potassium in morning. Patient states she did no thave this medicine yet. States that the drug store called her to pick up the drug a few days ago but she has "not had time to go get it". Instructed patient of importance of taking potassium with HCTZ and also told her if her potassium is too low, we will be unable to do surgery in morning. She said she will go get this as soon as the drug store opens.

## 2016-01-30 ENCOUNTER — Ambulatory Visit (HOSPITAL_COMMUNITY): Payer: BLUE CROSS/BLUE SHIELD | Admitting: Anesthesiology

## 2016-01-30 ENCOUNTER — Ambulatory Visit (HOSPITAL_COMMUNITY)
Admission: RE | Admit: 2016-01-30 | Discharge: 2016-01-30 | Disposition: A | Discharge status: Home / Self Care | Payer: BLUE CROSS/BLUE SHIELD | Source: Ambulatory Visit | Attending: Orthopedic Surgery | Admitting: Orthopedic Surgery

## 2016-01-30 ENCOUNTER — Encounter (HOSPITAL_COMMUNITY)
Admission: RE | Disposition: A | Discharge status: Home / Self Care | Payer: Self-pay | Source: Ambulatory Visit | Attending: Orthopedic Surgery

## 2016-01-30 ENCOUNTER — Encounter (HOSPITAL_COMMUNITY): Payer: Self-pay

## 2016-01-30 DIAGNOSIS — M65811 Other synovitis and tenosynovitis, right shoulder: Secondary | ICD-10-CM | POA: Insufficient documentation

## 2016-01-30 DIAGNOSIS — Z888 Allergy status to other drugs, medicaments and biological substances status: Secondary | ICD-10-CM | POA: Insufficient documentation

## 2016-01-30 DIAGNOSIS — M7581 Other shoulder lesions, right shoulder: Secondary | ICD-10-CM | POA: Insufficient documentation

## 2016-01-30 DIAGNOSIS — M75121 Complete rotator cuff tear or rupture of right shoulder, not specified as traumatic: Secondary | ICD-10-CM | POA: Diagnosis not present

## 2016-01-30 DIAGNOSIS — Z90710 Acquired absence of both cervix and uterus: Secondary | ICD-10-CM | POA: Insufficient documentation

## 2016-01-30 DIAGNOSIS — R011 Cardiac murmur, unspecified: Secondary | ICD-10-CM | POA: Insufficient documentation

## 2016-01-30 DIAGNOSIS — M7541 Impingement syndrome of right shoulder: Secondary | ICD-10-CM

## 2016-01-30 DIAGNOSIS — I739 Peripheral vascular disease, unspecified: Secondary | ICD-10-CM | POA: Insufficient documentation

## 2016-01-30 DIAGNOSIS — M7551 Bursitis of right shoulder: Secondary | ICD-10-CM | POA: Insufficient documentation

## 2016-01-30 DIAGNOSIS — M1990 Unspecified osteoarthritis, unspecified site: Secondary | ICD-10-CM | POA: Insufficient documentation

## 2016-01-30 DIAGNOSIS — I10 Essential (primary) hypertension: Secondary | ICD-10-CM | POA: Insufficient documentation

## 2016-01-30 HISTORY — PX: SHOULDER ARTHROSCOPY WITH ROTATOR CUFF REPAIR: SHX5685

## 2016-01-30 HISTORY — PX: SHOULDER OPEN ROTATOR CUFF REPAIR: SHX2407

## 2016-01-30 LAB — POCT I-STAT 4, (NA,K, GLUC, HGB,HCT)
Glucose, Bld: 104 mg/dL — ABNORMAL HIGH (ref 65–99)
HCT: 36 % (ref 36.0–46.0)
Hemoglobin: 12.2 g/dL (ref 12.0–15.0)
POTASSIUM: 4.3 mmol/L (ref 3.5–5.1)
SODIUM: 140 mmol/L (ref 135–145)

## 2016-01-30 SURGERY — ARTHROSCOPY, SHOULDER, WITH ROTATOR CUFF REPAIR
Anesthesia: General | Site: Shoulder | Laterality: Right

## 2016-01-30 MED ORDER — MIDAZOLAM HCL 2 MG/2ML IJ SOLN
INTRAMUSCULAR | Status: AC
  Filled 2016-01-30: qty 2

## 2016-01-30 MED ORDER — BUPIVACAINE-EPINEPHRINE (PF) 0.25% -1:200000 IJ SOLN
INTRAMUSCULAR | Status: DC | PRN
  Administered 2016-01-30: 60 mL via PERINEURAL

## 2016-01-30 MED ORDER — DEXAMETHASONE SODIUM PHOSPHATE 4 MG/ML IJ SOLN
INTRAMUSCULAR | Status: AC
  Filled 2016-01-30: qty 1

## 2016-01-30 MED ORDER — EPINEPHRINE PF 1 MG/ML IJ SOLN
INTRAMUSCULAR | Status: AC
  Filled 2016-01-30: qty 5

## 2016-01-30 MED ORDER — IBUPROFEN 800 MG PO TABS: 800.0000 mg | ORAL_TABLET | Freq: Three times a day (TID) | ORAL | 1 refills | Status: DC | PRN

## 2016-01-30 MED ORDER — LACTATED RINGERS IV SOLN
INTRAVENOUS | Status: DC
  Administered 2016-01-30 (×2): via INTRAVENOUS

## 2016-01-30 MED ORDER — DEXAMETHASONE SODIUM PHOSPHATE 4 MG/ML IJ SOLN
4.0000 mg | Freq: Once | INTRAMUSCULAR | Status: AC
  Administered 2016-01-30: 4 mg via INTRAVENOUS

## 2016-01-30 MED ORDER — GLYCOPYRROLATE 0.2 MG/ML IJ SOLN
INTRAMUSCULAR | Status: DC | PRN
  Administered 2016-01-30: 0.6 mg via INTRAVENOUS

## 2016-01-30 MED ORDER — SODIUM CHLORIDE 0.9 % IR SOLN
Status: DC | PRN
  Administered 2016-01-30: 1000 mL

## 2016-01-30 MED ORDER — PREGABALIN 50 MG PO CAPS
50.0000 mg | ORAL_CAPSULE | Freq: Once | ORAL | Status: AC
  Administered 2016-01-30: 50 mg via ORAL

## 2016-01-30 MED ORDER — LIDOCAINE HCL (PF) 1 % IJ SOLN
INTRAMUSCULAR | Status: AC
  Filled 2016-01-30: qty 5

## 2016-01-30 MED ORDER — BUPIVACAINE-EPINEPHRINE (PF) 0.25% -1:200000 IJ SOLN
INTRAMUSCULAR | Status: AC
  Filled 2016-01-30: qty 60

## 2016-01-30 MED ORDER — DIPHENHYDRAMINE HCL 50 MG/ML IJ SOLN
INTRAMUSCULAR | Status: AC
  Filled 2016-01-30: qty 1

## 2016-01-30 MED ORDER — ONDANSETRON HCL 4 MG/2ML IJ SOLN
4.0000 mg | Freq: Once | INTRAMUSCULAR | Status: AC
  Administered 2016-01-30: 4 mg via INTRAVENOUS

## 2016-01-30 MED ORDER — FENTANYL CITRATE (PF) 100 MCG/2ML IJ SOLN
INTRAMUSCULAR | Status: AC
  Filled 2016-01-30: qty 2

## 2016-01-30 MED ORDER — PREGABALIN 50 MG PO CAPS
ORAL_CAPSULE | ORAL | Status: AC
  Filled 2016-01-30: qty 1

## 2016-01-30 MED ORDER — EPINEPHRINE PF 1 MG/ML IJ SOLN
INTRAMUSCULAR | Status: AC
  Filled 2016-01-30: qty 2

## 2016-01-30 MED ORDER — CELECOXIB 400 MG PO CAPS
ORAL_CAPSULE | ORAL | Status: AC
  Filled 2016-01-30: qty 1

## 2016-01-30 MED ORDER — OXYCODONE HCL 5 MG PO TABS
ORAL_TABLET | ORAL | Status: AC
  Filled 2016-01-30: qty 1

## 2016-01-30 MED ORDER — CYCLOBENZAPRINE HCL 5 MG PO TABS: 5.0000 mg | ORAL_TABLET | Freq: Three times a day (TID) | ORAL | 0 refills | Status: DC | PRN

## 2016-01-30 MED ORDER — ROCURONIUM BROMIDE 100 MG/10ML IV SOLN
INTRAVENOUS | Status: DC | PRN
  Administered 2016-01-30: 5 mg via INTRAVENOUS
  Administered 2016-01-30: 10 mg via INTRAVENOUS
  Administered 2016-01-30: 45 mg via INTRAVENOUS

## 2016-01-30 MED ORDER — FENTANYL CITRATE (PF) 100 MCG/2ML IJ SOLN
INTRAMUSCULAR | Status: DC | PRN
  Administered 2016-01-30 (×2): 50 ug via INTRAVENOUS

## 2016-01-30 MED ORDER — OXYCODONE HCL 5 MG PO TABS
5.0000 mg | ORAL_TABLET | Freq: Once | ORAL | Status: AC
  Administered 2016-01-30: 5 mg via ORAL

## 2016-01-30 MED ORDER — ONDANSETRON HCL 4 MG/2ML IJ SOLN
INTRAMUSCULAR | Status: AC
  Filled 2016-01-30: qty 2

## 2016-01-30 MED ORDER — HYDROCODONE-ACETAMINOPHEN 10-325 MG PO TABS: 1.0000 | ORAL_TABLET | ORAL | 0 refills | Status: DC | PRN

## 2016-01-30 MED ORDER — CHLORHEXIDINE GLUCONATE 4 % EX LIQD: 60.0000 mL | Freq: Once | CUTANEOUS | Status: DC

## 2016-01-30 MED ORDER — MIDAZOLAM HCL 2 MG/2ML IJ SOLN
1.0000 mg | INTRAMUSCULAR | Status: DC | PRN
  Administered 2016-01-30: 2 mg via INTRAVENOUS

## 2016-01-30 MED ORDER — METHOCARBAMOL 1000 MG/10ML IJ SOLN
500.0000 mg | Freq: Once | INTRAVENOUS | Status: AC
  Administered 2016-01-30: 500 mg via INTRAVENOUS
  Filled 2016-01-30: qty 550

## 2016-01-30 MED ORDER — CEFAZOLIN SODIUM-DEXTROSE 2-4 GM/100ML-% IV SOLN
2.0000 g | INTRAVENOUS | Status: AC
  Administered 2016-01-30: 2 g via INTRAVENOUS
  Filled 2016-01-30: qty 100

## 2016-01-30 MED ORDER — PHENYLEPHRINE HCL 10 MG/ML IJ SOLN
INTRAMUSCULAR | Status: DC | PRN
  Administered 2016-01-30 (×2): 80 ug via INTRAVENOUS
  Administered 2016-01-30: 120 ug via INTRAVENOUS

## 2016-01-30 MED ORDER — MIDAZOLAM HCL 5 MG/5ML IJ SOLN
INTRAMUSCULAR | Status: DC | PRN
  Administered 2016-01-30: 1 mg via INTRAVENOUS

## 2016-01-30 MED ORDER — GLYCOPYRROLATE 0.2 MG/ML IJ SOLN
INTRAMUSCULAR | Status: AC
  Filled 2016-01-30: qty 3

## 2016-01-30 MED ORDER — DIPHENHYDRAMINE HCL 50 MG/ML IJ SOLN
25.0000 mg | Freq: Once | INTRAMUSCULAR | Status: AC
  Administered 2016-01-30: 25 mg via INTRAVENOUS

## 2016-01-30 MED ORDER — ROCURONIUM BROMIDE 50 MG/5ML IV SOLN
INTRAVENOUS | Status: AC
  Filled 2016-01-30: qty 1

## 2016-01-30 MED ORDER — PROPOFOL 10 MG/ML IV BOLUS
INTRAVENOUS | Status: DC | PRN
  Administered 2016-01-30: 150 mg via INTRAVENOUS

## 2016-01-30 MED ORDER — LIDOCAINE HCL 1 % IJ SOLN
INTRAMUSCULAR | Status: DC | PRN
  Administered 2016-01-30: 25 mg via INTRADERMAL

## 2016-01-30 MED ORDER — HYDROMORPHONE HCL 1 MG/ML IJ SOLN
0.2500 mg | INTRAMUSCULAR | Status: DC | PRN
  Administered 2016-01-30: 0.5 mg via INTRAVENOUS
  Filled 2016-01-30: qty 0.5

## 2016-01-30 MED ORDER — SODIUM CHLORIDE 0.9 % IR SOLN
Status: DC | PRN
  Administered 2016-01-30 (×7): 3000 mL

## 2016-01-30 MED ORDER — NEOSTIGMINE METHYLSULFATE 10 MG/10ML IV SOLN
INTRAVENOUS | Status: DC | PRN
  Administered 2016-01-30: 2 mg via INTRAVENOUS
  Administered 2016-01-30: 3 mg via INTRAVENOUS

## 2016-01-30 MED ORDER — FENTANYL CITRATE (PF) 250 MCG/5ML IJ SOLN
INTRAMUSCULAR | Status: AC
  Filled 2016-01-30: qty 5

## 2016-01-30 MED ORDER — CELECOXIB 400 MG PO CAPS
400.0000 mg | ORAL_CAPSULE | Freq: Once | ORAL | Status: AC
  Administered 2016-01-30: 400 mg via ORAL

## 2016-01-30 SURGICAL SUPPLY — 94 items
ANCHOR CORKSCREW 6.5 FIBERWIRE (Anchor) ×4 IMPLANT
BAG HAMPER (MISCELLANEOUS) ×2 IMPLANT
BIT DRILL 2.0MX128MM (BIT) IMPLANT
BLADE AGGRESSIVE PLUS 4.0 (BLADE) ×2 IMPLANT
BLADE AVERAGE 25X9 (BLADE) IMPLANT
BLADE HEX COATED 2.75 (ELECTRODE) ×2 IMPLANT
BLADE SURG 15 STRL LF DISP TIS (BLADE) ×1 IMPLANT
BLADE SURG 15 STRL SS (BLADE) ×1
BLADE SURG SZ10 CARB STEEL (BLADE) ×2 IMPLANT
BLADE SURG SZ11 CARB STEEL (BLADE) ×2 IMPLANT
BNDG COHESIVE 4X5 TAN STRL (GAUZE/BANDAGES/DRESSINGS) ×2 IMPLANT
BUR 5.0 BARRELL (BURR) IMPLANT
BUR BARRELL 4.0 (BURR) ×2 IMPLANT
BUR ROUND 5.0 (BURR) IMPLANT
CANNULA DRILOCK 5.0X75 (CANNULA) IMPLANT
CANNULA DRILOCK 6.5X75 (CANNULA) ×2 IMPLANT
CANNULA DRILOCK 8.0X75 (CANNULA) IMPLANT
CHLORAPREP W/TINT 26ML (MISCELLANEOUS) ×2 IMPLANT
CLOTH BEACON ORANGE TIMEOUT ST (SAFETY) ×2 IMPLANT
CONNECTOR PERFECT PASSER (CONNECTOR) ×2 IMPLANT
COVER LIGHT HANDLE STERIS (MISCELLANEOUS) ×4 IMPLANT
COVER PROBE W GEL 5X96 (DRAPES) ×2 IMPLANT
DECANTER SPIKE VIAL GLASS SM (MISCELLANEOUS) ×2 IMPLANT
DRAPE PROXIMA HALF (DRAPES) ×2 IMPLANT
DRAPE SHOULDER BEACH CHAIR (DRAPES) ×2 IMPLANT
DRAPE U-SHAPE 47X51 STRL (DRAPES) ×2 IMPLANT
DRESSING MEPILEX BORDER 6X8 (GAUZE/BANDAGES/DRESSINGS) ×1 IMPLANT
DRSG MEPILEX BORDER 4X8 (GAUZE/BANDAGES/DRESSINGS) IMPLANT
DRSG MEPILEX BORDER 6X8 (GAUZE/BANDAGES/DRESSINGS) ×2
ELECT REM PT RETURN 9FT ADLT (ELECTROSURGICAL) ×2
ELECTRODE REM PT RTRN 9FT ADLT (ELECTROSURGICAL) ×1 IMPLANT
FIBERSTICK 2 (SUTURE) IMPLANT
GAUZE SPONGE 4X4 16PLY XRAY LF (GAUZE/BANDAGES/DRESSINGS) ×2 IMPLANT
GLOVE BIOGEL PI IND STRL 7.0 (GLOVE) ×1 IMPLANT
GLOVE BIOGEL PI INDICATOR 7.0 (GLOVE) ×1
GLOVE SKINSENSE NS SZ8.0 LF (GLOVE) ×1
GLOVE SKINSENSE STRL SZ8.0 LF (GLOVE) ×1 IMPLANT
GLOVE SS N UNI LF 8.5 STRL (GLOVE) ×2 IMPLANT
GOWN STRL REUS W/TWL LRG LVL3 (GOWN DISPOSABLE) ×6 IMPLANT
GOWN STRL REUS W/TWL XL LVL3 (GOWN DISPOSABLE) ×2 IMPLANT
IMMOBILIZER SHOULDER LGE (ORTHOPEDIC SUPPLIES) ×2 IMPLANT
IMMOBILIZER SHOULDER MED (ORTHOPEDIC SUPPLIES) IMPLANT
INST SET MINOR BONE (KITS) ×2 IMPLANT
IV NS IRRIG 3000ML ARTHROMATIC (IV SOLUTION) ×14 IMPLANT
KIT BLADEGUARD II DBL (SET/KITS/TRAYS/PACK) ×2 IMPLANT
KIT POSITION SHOULDER SCHLEI (MISCELLANEOUS) ×2 IMPLANT
KIT ROOM TURNOVER APOR (KITS) ×2 IMPLANT
MANIFOLD NEPTUNE II (INSTRUMENTS) ×2 IMPLANT
MARKER SKIN DUAL TIP RULER LAB (MISCELLANEOUS) ×4 IMPLANT
NEEDLE HYPO 18GX1.5 BLUNT FILL (NEEDLE) ×2 IMPLANT
NEEDLE HYPO 21X1.5 SAFETY (NEEDLE) ×2 IMPLANT
NEEDLE MAYO 1/2 CIRCLE (NEEDLE) ×2 IMPLANT
NEEDLE MAYO 6 CRC TAPER PT (NEEDLE) IMPLANT
NEEDLE SCORPION (NEEDLE) IMPLANT
NEEDLE SPNL 18GX3.5 QUINCKE PK (NEEDLE) ×2 IMPLANT
NS IRRIG 1000ML POUR BTL (IV SOLUTION) ×2 IMPLANT
PACK BASIC III (CUSTOM PROCEDURE TRAY) ×1
PACK SRG BSC III STRL LF ECLPS (CUSTOM PROCEDURE TRAY) ×1 IMPLANT
PAD ABD 5X9 TENDERSORB (GAUZE/BANDAGES/DRESSINGS) IMPLANT
PAD ARMBOARD 7.5X6 YLW CONV (MISCELLANEOUS) ×2 IMPLANT
PASSER SUT CAPTURE FIRST (SUTURE) ×2 IMPLANT
PASSER SUT SWANSON 36MM LOOP (INSTRUMENTS) IMPLANT
PENCIL HANDSWITCHING (ELECTRODE) ×2 IMPLANT
RASP SM TEAR CROSS CUT (RASP) IMPLANT
SET ARTHROSCOPY INST (INSTRUMENTS) ×2 IMPLANT
SET BASIN LINEN APH (SET/KITS/TRAYS/PACK) ×2 IMPLANT
STAPLER VISISTAT (STAPLE) ×2 IMPLANT
STOCKINETTE IMPERVIOUS LG (DRAPES) ×2 IMPLANT
SUT BONE WAX W31G (SUTURE) IMPLANT
SUT ETHIBOND NAB OS 4 #2 30IN (SUTURE) ×2 IMPLANT
SUT ETHILON 3 0 FSL (SUTURE) ×2 IMPLANT
SUT FIBERWIRE #2 38 REV NDL BL (SUTURE)
SUT FIBERWIRE #2 38 T-5 BLUE (SUTURE)
SUT LASSO 45 DEGREE (SUTURE) IMPLANT
SUT LASSO 45 DEGREE LEFT (SUTURE) IMPLANT
SUT LASSO 45D RIGHT (SUTURE) IMPLANT
SUT MNCRL 0 VIOLET CTX 36 (SUTURE) IMPLANT
SUT MON AB 2-0 CT1 36 (SUTURE) IMPLANT
SUT MONOCRYL 0 CTX 36 (SUTURE)
SUT PERFECTPASSER WHITE CART (SUTURE) ×4 IMPLANT
SUT PROLENE 2 0 FS (SUTURE) IMPLANT
SUT SMART STITCH CARTRIDGE (SUTURE) ×6 IMPLANT
SUT VIC AB 1 CT1 27 (SUTURE)
SUT VIC AB 1 CT1 27XBRD ANTBC (SUTURE) IMPLANT
SUTURE FIBERWR #2 38 T-5 BLUE (SUTURE) IMPLANT
SUTURE FIBERWR#2 38 REV NDL BL (SUTURE) IMPLANT
SYR 30ML LL (SYRINGE) ×2 IMPLANT
SYR 5ML LL (SYRINGE) ×2 IMPLANT
SYR BULB IRRIGATION 50ML (SYRINGE) ×2 IMPLANT
TOWEL OR 17X26 4PK STRL BLUE (TOWEL DISPOSABLE) IMPLANT
TUBING ARTHROSCOPY INFLOW/OUT (IRRIGATION / IRRIGATOR) ×2 IMPLANT
WAND 90 DEG TURBOVAC W/CORD (SURGICAL WAND) ×2 IMPLANT
YANKAUER SUCT 12FT TUBE ARGYLE (SUCTIONS) ×4 IMPLANT
YANKAUER SUCT BULB TIP 10FT TU (MISCELLANEOUS) ×4 IMPLANT

## 2016-01-30 NOTE — Anesthesia Postprocedure Evaluation (Signed)
Anesthesia Post Note  Patient: LAUREN EADDY  Procedure(s) Performed: Procedure(s) (LRB): SHOULDER ARTHROSCOPY WITH ACROMIOPLASTY (Right) ROTATOR CUFF REPAIR SHOULDER OPEN (Right)  Patient location during evaluation: PACU Anesthesia Type: General Level of consciousness: awake, oriented and patient cooperative Pain management: pain level controlled Vital Signs Assessment: post-procedure vital signs reviewed and stable Respiratory status: spontaneous breathing, nonlabored ventilation and respiratory function stable Cardiovascular status: blood pressure returned to baseline Postop Assessment: no signs of nausea or vomiting Anesthetic complications: no     Last Vitals:  Vitals:   01/30/16 0845 01/30/16 1210  BP: 127/68 (!) 159/77  Pulse:  64  Resp: (!) 24 20  Temp:  36.5 C    Last Pain:  Vitals:   01/30/16 1210  TempSrc:   PainSc: Asleep                 Chrishaun Sasso J

## 2016-01-30 NOTE — Op Note (Signed)
01/30/2016 11:51 AM   Preop right rotator cuff tear  Post op same   Procedure arthroscopy right shoulder with limited debridement, open rotator cuff repair right shoulder  Surgeon [place] by Simonne Maffucci  Gen. anesthesia  Operative findings the glenohumeral joint had normal articular surface on the glenoid and humeral side extensive synovitis throughout the joint torn rotator cuff degenerative biceps tendon with intact biceps anchor small nonsurgical tear subscapularis  The subacromial space had bursitis. There was also good visualization of the U-shaped rotator cuff tear.  2/6.5 anchors were used with multiple side to side sutures  The surgery was done as follows:  The patient was seen in preop and evaluated and deemed acceptable for surgery with intact skin. Vital signs are stable. Right shoulder was confirmed as surgical site and marked chart review was completed patient was taken to surgery for general anesthesia which went without any incident  She was in the supine position and placed in the modified beachchair position with a Schlein positioner  Her right arm was prepped and draped with ChloraPrep  Once the timeout was executed I injected the subacromial space and portals with 30 mL of Marcaine with epinephrine, I made a posterior incision and placed a scope into the joint and did a full diagnostic arthroscopy starting in the glenohumeral joint and then progressing around the joint in standard fashion  I then made a portal and did a limited joint debridement to remove synovitis and debridement biceps tendon. I then placed a scope in the subacromial space and did an acromioplasty bursectomy and debridement of the undersurface of the acromioclavicular joint  I then assessed the tear through a lateral portal and found that it was not mobile. After releasing superiorly and inferiorly the tear did not seem to move from lateral to medial although the posterior leaf was  mobile. The anterior leaf was not. I debrided and released that and got a little bit more mobility.  I placed a suture into the cuff for control and then did an open anterolateral incision at the anterior corner of the acromion.  I divided the deltoid via vertical split but did not detach it. I removed any remaining bursal tissue and then reassess the tear. I attempted several side-to-side sutures at first which were unsuccessful I then decided to mobilize the posterior portion of the cuff back to his more anterior position and then take the anterior portion and mobilize it to the posterior portion of the cuff closing the you. I placed 2 anchors to anchor the anterior and posterior leafs and then placed side-to-side sutures which had less tension on them and gave a good repair with 1 cm of humeral head exposed.  Thorough irrigation was performed. This was followed by closure of the deltoid split with 0 Monocryl suture and then 2-0 Monocryl in the subcutaneous tissue and staples to close the portals and skin  I injected 30 mL of Marcaine with epinephrine.  We applied a shoulder immobilizer and then she was extubated and taken to recovery in stable condition  I recommend the following postoperative protocol   6 weeks of shoulder mobilization  Weeks 7 through 12 passive range of motion  Week 13 through 24 progressive resistance exercises

## 2016-01-30 NOTE — Anesthesia Preprocedure Evaluation (Signed)
Anesthesia Evaluation  Patient identified by MRN, date of birth, ID band Patient awake    Reviewed: Allergy & Precautions, NPO status , Patient's Chart, lab work & pertinent test results  Airway Mallampati: II  TM Distance: >3 FB     Dental  (+) Teeth Intact, Partial Upper   Pulmonary neg pulmonary ROS,    breath sounds clear to auscultation       Cardiovascular hypertension, Pt. on medications + Peripheral Vascular Disease   Rhythm:Regular Rate:Normal     Neuro/Psych    GI/Hepatic negative GI ROS,   Endo/Other    Renal/GU      Musculoskeletal  (+) Arthritis ,   Abdominal   Peds  Hematology  (+) anemia ,   Anesthesia Other Findings   Reproductive/Obstetrics                             Anesthesia Physical Anesthesia Plan  ASA: II  Anesthesia Plan: General   Post-op Pain Management:    Induction: Intravenous  Airway Management Planned: Oral ETT  Additional Equipment:   Intra-op Plan:   Post-operative Plan: Extubation in OR  Informed Consent: I have reviewed the patients History and Physical, chart, labs and discussed the procedure including the risks, benefits and alternatives for the proposed anesthesia with the patient or authorized representative who has indicated his/her understanding and acceptance.     Plan Discussed with:   Anesthesia Plan Comments:         Anesthesia Quick Evaluation

## 2016-01-30 NOTE — Interval H&P Note (Signed)
History and Physical Interval Note:  01/30/2016 8:26 AM  BP 129/74   Pulse 67   Temp 98.8 F (37.1 C) (Oral)   Resp 16   Ht 5\' 8"  (1.727 m)   Wt 189 lb (85.7 kg)   SpO2 99%   BMI 28.74 kg/m   Skin right shoulder normal   Betty Dawson  has presented today for surgery, with the diagnosis of right rotator cuff tear  The various methods of treatment have been discussed with the patient and family. After consideration of risks, benefits and other options for treatment, the patient has consented to  Procedure(s) with comments: SHOULDER ARTHROSCOPY WITH ROTATOR CUFF REPAIR (Right) - pt knows to arrive at 7:15 as a surgical intervention .  The patient's history has been reviewed, patient examined, no change in status, stable for surgery.  I have reviewed the patient's chart and labs.  Questions were answered to the patient's satisfaction.     Arther Abbott

## 2016-01-30 NOTE — Interval H&P Note (Signed)
History and Physical Interval Note:  01/30/2016 8:26 AM  Betty Dawson  has presented today for surgery, with the diagnosis of right rotator cuff tear  The various methods of treatment have been discussed with the patient and family. After consideration of risks, benefits and other options for treatment, the patient has consented to  Procedure(s) with comments: SHOULDER ARTHROSCOPY WITH ROTATOR CUFF REPAIR (Right) - pt knows to arrive at 7:15 as a surgical intervention .  The patient's history has been reviewed, patient examined, no change in status, stable for surgery.  I have reviewed the patient's chart and labs.  Questions were answered to the patient's satisfaction.     Arther Abbott

## 2016-01-30 NOTE — Addendum Note (Signed)
Addendum  created 01/30/16 1402 by Vista Deck, CRNA   Anesthesia Intra Flowsheets edited

## 2016-01-30 NOTE — Progress Notes (Signed)
Patient experiencing intense itching within 3 minutes of administration of Decadron and Zofran. Palmar itching bilaterally then a generalized itching. No rash noted. Dr. Patsey Berthold notified and orders given for Benadryl 25mg  IV.

## 2016-01-30 NOTE — Anesthesia Procedure Notes (Signed)
Procedure Name: Intubation Date/Time: 01/30/2016 9:13 AM Performed by: Charmaine Downs Pre-anesthesia Checklist: Patient identified, Patient being monitored, Timeout performed, Emergency Drugs available and Suction available Patient Re-evaluated:Patient Re-evaluated prior to inductionOxygen Delivery Method: Circle System Utilized Preoxygenation: Pre-oxygenation with 100% oxygen Intubation Type: IV induction and Cricoid Pressure applied Ventilation: Mask ventilation without difficulty and Oral airway inserted - appropriate to patient size Laryngoscope Size: Mac and 3 Grade View: Grade II Tube type: Oral Tube size: 7.0 mm Number of attempts: 1 Airway Equipment and Method: stylet Placement Confirmation: ETT inserted through vocal cords under direct vision,  positive ETCO2 and breath sounds checked- equal and bilateral Secured at: 22 cm Tube secured with: Tape Dental Injury: Teeth and Oropharynx as per pre-operative assessment  Difficulty Due To: Difficult Airway- due to dentition

## 2016-01-30 NOTE — Transfer of Care (Signed)
Immediate Anesthesia Transfer of Care Note  Patient: Betty Dawson  Procedure(s) Performed: Procedure(s) with comments: SHOULDER ARTHROSCOPY WITH ACROMIOPLASTY (Right) - pt knows to arrive at [place] (Right)  Patient Location: PACU  Anesthesia Type:General  Level of Consciousness: sedated  Airway & Oxygen Therapy: Patient Spontanous Breathing and Patient connected to face mask oxygen  Post-op Assessment: Report given to RN, Post -op Vital signs reviewed and stable and Patient moving all extremities  Post vital signs: Reviewed and stable  Last Vitals:  Vitals:   01/30/16 0840 01/30/16 0845  BP: (!) 174/152 127/68  Pulse:    Resp: 20 (!) 24  Temp:      Last Pain:  Vitals:   01/30/16 0738  TempSrc: Oral  PainSc: 5       Patients Stated Pain Goal: 6 ([phone] [id number])  Complications: No apparent anesthesia complications

## 2016-01-30 NOTE — Brief Op Note (Signed)
01/30/2016  11:46 AM  PATIENT:  Betty Dawson  61 y.o. female  PRE-OPERATIVE DIAGNOSIS:  right rotator cuff tear  POST-OPERATIVE DIAGNOSIS:  right rotator cuff tear  PROCEDURE:  Procedure(s) with comments: SHOULDER ARTHROSCOPY, arthroscopic acromioplasty and debridement of glenohumeral joint limited , WITH open ROTATOR CUFF REPAIR (Right)  SURGEON:  Surgeon(s) and Role:    * Carole Civil, MD - Primary  PHYSICIAN ASSISTANT:   ASSISTANTS: betty ashley    ANESTHESIA:   general  EBL:  Total I/O In: 1300 [I.V.:1300] Out: 30 [Blood:30]  BLOOD ADMINISTERED:none  DRAINS: none   LOCAL MEDICATIONS USED:  MARCAINE    and Amount: 60 ml  SPECIMEN:  No Specimen  DISPOSITION OF SPECIMEN:  N/A  COUNTS:  YES  TOURNIQUET:  * No tourniquets in log *  DICTATION: .Dragon Dictation  PLAN OF CARE: Discharge to home after PACU  PATIENT DISPOSITION:  PACU - hemodynamically stable.   Delay start of Pharmacological VTE agent (>24hrs) due to surgical blood loss or risk of bleeding: not applicable  [id number]

## 2016-01-30 NOTE — Discharge Instructions (Signed)
Shoulder Arthroscopy, Care After Refer to this sheet in the next few weeks. These instructions provide you with information on caring for yourself after your procedure. Your health care provider may also give you more specific instructions. Your treatment has been planned according to current medical practices, but problems sometimes occur. Call your health care provider if you have any problems or questions after your procedure. What can I expect after the procedure? After your procedure, it is typical to have the following:  Pain at the site of the surgical cuts (incisions).  Stiffness in your shoulder. This should gradually decrease over time. Your health care provider may recommend physical therapy to help improve this.  Nausea, vomiting, or constipation. These symptoms can result from taking pain medicine after surgery.  Clear or red drainage from the incision sites. This is normal for a few days after surgery.  Fatigue. Follow these instructions at home:  Take medicines only as directed by your health care provider.  Use a sling as directed.  There are many different ways to close and cover an incision, including stitches, skin glue, and adhesive strips. Follow your health care provider's instructions on:  Incision care.  Bandage (dressing) changes and removal.  Incision closure removal.  Apply ice to the injured area:  Put ice in a plastic bag.  Place a towel between your skin and the bag.  Leave the ice on for 20 minutes, 2-3 times a day.  If physical therapy and exercises are prescribed by your health care provider, do them as directed.  Keep all follow-up visits as directed by your health care provider. This is important. Contact a health care provider if: You have a fever. Get help right away if:  You have drainage, redness, swelling, or increasing pain at the incision site.  You notice a bad smell coming from the incision site or dressing.  Your incision  site breaks open after your stitches or tape has been removed. This information is not intended to replace advice given to you by your health care provider. Make sure you discuss any questions you have with your health care provider. Document Released: 08/09/2013 Document Revised: 09/08/2015 Document Reviewed: 03/03/2013 Elsevier Interactive Patient Education  2017 [employer] POST-ANESTHESIA  IMMEDIATELY FOLLOWING SURGERY:  Do not drive or operate machinery for the first twenty four hours after surgery.  Do not make any important decisions for twenty four hours after surgery or while taking narcotic pain medications or sedatives.  If you develop intractable nausea and vomiting or a severe headache please notify your doctor immediately.  FOLLOW-UP:  Please make an appointment with your surgeon as instructed. You do not need to follow up with anesthesia unless specifically instructed to do so.  WOUND CARE INSTRUCTIONS (if applicable):  Keep a dry clean dressing on the anesthesia/puncture wound site if there is drainage.  Once the wound has quit draining you may leave it open to air.  Generally you should leave the bandage intact for twenty four hours unless there is drainage.  If the epidural site drains for more than 36-48 hours please call the anesthesia department.  QUESTIONS?:  Please feel free to call your physician or the hospital operator if you have any questions, and they will be happy to assist you.

## 2016-01-30 NOTE — Progress Notes (Signed)
Itching subsided after Benadryl.

## 2016-01-31 ENCOUNTER — Encounter (HOSPITAL_COMMUNITY): Payer: Self-pay | Admitting: Orthopedic Surgery

## 2016-02-03 ENCOUNTER — Encounter: Payer: Self-pay | Admitting: Orthopedic Surgery

## 2016-02-03 ENCOUNTER — Ambulatory Visit (INDEPENDENT_AMBULATORY_CARE_PROVIDER_SITE_OTHER): Payer: BLUE CROSS/BLUE SHIELD | Admitting: Orthopedic Surgery

## 2016-02-03 DIAGNOSIS — Z4889 Encounter for other specified surgical aftercare: Secondary | ICD-10-CM

## 2016-02-03 DIAGNOSIS — Z9889 Other specified postprocedural states: Secondary | ICD-10-CM

## 2016-02-03 NOTE — Progress Notes (Signed)
Patient ID: Betty Dawson, female   DOB: 11/17/1955, 61 y.o.   MRN: [id number]  Follow up visit/  postop day #4  Chief Complaint  Patient presents with  . Follow-up    POST OP 1, RT RCR, DOS 01/30/16    Status post open right rotator cuff repair with arthroscopic subacromial decompression  Wound check all suture lines are clean dry and intact no drainage or erythema  Medication ibuprofen 800 mg once a day she is taking and hydrocodone 3 times a day as well as muscle relaxer with good pain control     Encounter Diagnoses  Name Primary?  Marland Kitchen Aftercare following surgery Yes  . Status post right rotator cuff repair     Follow-up next Tuesday for staples to be removed and application of sling shot  10:53 AM Arther Abbott, MD 02/03/2016

## 2016-02-03 NOTE — Patient Instructions (Signed)
Continue sling wear

## 2016-02-11 ENCOUNTER — Ambulatory Visit (INDEPENDENT_AMBULATORY_CARE_PROVIDER_SITE_OTHER): Payer: BLUE CROSS/BLUE SHIELD | Admitting: Orthopedic Surgery

## 2016-02-11 DIAGNOSIS — Z9889 Other specified postprocedural states: Secondary | ICD-10-CM

## 2016-02-11 NOTE — Progress Notes (Signed)
NURSE NOTE REVIEWED

## 2016-02-18 ENCOUNTER — Encounter: Payer: Self-pay | Admitting: Orthopedic Surgery

## 2016-02-18 ENCOUNTER — Ambulatory Visit (INDEPENDENT_AMBULATORY_CARE_PROVIDER_SITE_OTHER): Payer: BLUE CROSS/BLUE SHIELD | Admitting: Orthopedic Surgery

## 2016-02-18 DIAGNOSIS — Z4889 Encounter for other specified surgical aftercare: Secondary | ICD-10-CM

## 2016-02-18 DIAGNOSIS — Z9889 Other specified postprocedural states: Secondary | ICD-10-CM

## 2016-02-18 NOTE — Progress Notes (Signed)
Patient ID: WAIVE MOUND, female   DOB: [birth year], 61 y.o.   MRN: [id number]  Follow up visit/  RT RCR  Chief Complaint  Patient presents with  . Follow-up    post op RT RCR, DOS 01/30/16    No medicines required at this time, sling shot is fitting appropriately, wound is clean dry and intact. Patient advised to continue the use of the shoulder harness come back in 3 weeks and then we'll start therapy   Encounter Diagnoses  Name Primary?  . Status post right rotator cuff repair Yes  . Aftercare following surgery       11:29 AM Arther Abbott, MD 02/18/2016

## 2016-02-18 NOTE — Patient Instructions (Signed)
[place]

## 2016-02-26 ENCOUNTER — Other Ambulatory Visit: Payer: Self-pay | Admitting: *Deleted

## 2016-02-26 MED ORDER — IBUPROFEN 800 MG PO TABS: 800.0000 mg | ORAL_TABLET | Freq: Three times a day (TID) | ORAL | 5 refills | Status: DC | PRN

## 2016-03-13 ENCOUNTER — Ambulatory Visit (INDEPENDENT_AMBULATORY_CARE_PROVIDER_SITE_OTHER): Payer: BLUE CROSS/BLUE SHIELD | Admitting: Orthopedic Surgery

## 2016-03-13 DIAGNOSIS — Z9889 Other specified postprocedural states: Secondary | ICD-10-CM

## 2016-03-13 DIAGNOSIS — Z4889 Encounter for other specified surgical aftercare: Secondary | ICD-10-CM

## 2016-03-13 MED ORDER — HYDROCODONE-ACETAMINOPHEN 7.5-325 MG PO TABS: 1.0000 | ORAL_TABLET | ORAL | 0 refills | Status: DC | PRN

## 2016-03-13 MED ORDER — CYCLOBENZAPRINE HCL 5 MG PO TABS: 5.0000 mg | ORAL_TABLET | Freq: Three times a day (TID) | ORAL | 0 refills | Status: DC | PRN

## 2016-03-13 NOTE — Addendum Note (Signed)
Addended by: Baldomero Lamy B on: 03/13/2016 11:16 AM   Modules accepted: Orders

## 2016-03-13 NOTE — Patient Instructions (Signed)
Start occupational therapy in our outpatient center  Continue sling until seen by therapy and wean appropriately per protocol

## 2016-03-13 NOTE — Progress Notes (Signed)
Patient ID: Betty Dawson, female   DOB: 1955-12-31, 61 y.o.   MRN: [id number]  Follow up visit/  Post op   Chief Complaint  Patient presents with  . Follow-up    3 week recheck on right shoulder, RCR, DOS 01-30-16.    Meds ordered this encounter  Medications  . HYDROcodone-acetaminophen (NORCO) 7.5-325 MG tablet    Sig: Take 1 tablet by mouth every 4 (four) hours as needed for moderate pain.    Dispense:  42 tablet    Refill:  0  . cyclobenzaprine (FLEXERIL) 5 MG tablet    Sig: Take 1 tablet (5 mg total) by mouth 3 (three) times daily as needed for muscle spasms.    Dispense:  30 tablet    Refill:  0   Wound healed nicely sling is wire appropriately  Okay to start therapy refill hydrocodone with 7.5 mg which is a decrease in dose continue Flexeril  Follow-up 6 weeks   Encounter Diagnoses  Name Primary?  . Status post right rotator cuff repair Yes  . Aftercare following surgery     There were no vitals taken for this visit.  11:10 AM Arther Abbott, MD 03/13/2016

## 2016-03-20 ENCOUNTER — Ambulatory Visit (HOSPITAL_COMMUNITY): Payer: BLUE CROSS/BLUE SHIELD | Attending: Orthopedic Surgery | Admitting: Specialist

## 2016-03-20 DIAGNOSIS — M25611 Stiffness of right shoulder, not elsewhere classified: Secondary | ICD-10-CM

## 2016-03-20 DIAGNOSIS — R29898 Other symptoms and signs involving the musculoskeletal system: Secondary | ICD-10-CM

## 2016-03-20 DIAGNOSIS — M25511 Pain in right shoulder: Secondary | ICD-10-CM | POA: Diagnosis present

## 2016-03-20 NOTE — Therapy (Signed)
[facility] [place], [place], [address] Phone: [phone]   Fax:  [phone]  Occupational Therapy Evaluation  Patient Details  Name: Betty Dawson MRN: [id number] Date of Birth: January 30, 1955 Referring Provider: Dr. Arther Abbott  Encounter Date: 03/20/2016      OT End of Session - 03/20/16 1435    Visit Number 1   Number of Visits 24   Date for OT Re-Evaluation 05/19/16   Authorization Type [employer]   Authorization Time Period 30 visits   Authorization - Visit Number 1   Authorization - Number of Visits 30   OT Start Time 0900   OT Stop Time 0945   OT Time Calculation (min) 45 min   Activity Tolerance Patient tolerated treatment well   Behavior During Therapy [facility] for tasks assessed/performed      Past Medical History:  Diagnosis Date  . Anemia   . Arthritis    OA multiple joints  . Heart murmur   . Heel spur   . Hypertension   . Varicose veins     Past Surgical History:  Procedure Laterality Date  . ABDOMINAL HYSTERECTOMY     endometriosis  . COLONOSCOPY  02/21/2013   [id number] polyp measuring 6 mm in size was found in the distal/transverse colon; polypectomy was performed using snare cautery/The colon was redundant/The colon mucosa was otherwise normal/Normal mucosa in the terminal ileum  . COLONOSCOPY N/A 02/21/2013   Procedure: COLONOSCOPY;  Surgeon: Danie Binder, MD;  Location: [facility] ENDO SUITE;  Service: Endoscopy;  Laterality: N/A;  10:30 AM  . FOOT SURGERY Right    heel spur  . SHOULDER ARTHROSCOPY WITH ROTATOR CUFF REPAIR Right 01/30/2016   Procedure: SHOULDER ARTHROSCOPY WITH ACROMIOPLASTY;  Surgeon: Carole Civil, MD;  Location: [facility] ORS;  Service: Orthopedics;  Laterality: Right;  pt knows to arrive at 7:15  . SHOULDER OPEN ROTATOR CUFF REPAIR Right 01/30/2016   Procedure: ROTATOR CUFF REPAIR SHOULDER OPEN;  Surgeon: Carole Civil, MD;  Location: [facility] ORS;  Service: Orthopedics;  Laterality:  Right;    There were no vitals filed for this visit.      Subjective Assessment - 03/20/16 1411    Subjective  S:  I guess it just wore out over time.    Pertinent History Ms. Glasscock is a 61 year old female with PMH significant for arthritis and hypertension.  Patient states she has been experiencing increased pain and decreased range in her right shoulder for several years.  However, it was beginning to significantly limit her function, so much so, she had to stop working late in 2017.  She had an open right rotator cuff repair completed by Dr. Aline Brochure on 01/30/16.  Dr. Aline Brochure has referred her to occupational therapy for evaluation and treatment following protocol:  P/ROM 03/19/16-04/24/16 followed by PRE from 04/24/16-07/25/16   Special Tests FOTO:  18/100   Patient Stated Goals I want to use my arm like normal again.   Currently in Pain? Yes   Pain Score 3    Pain Location Shoulder   Pain Orientation Right   Pain Descriptors / Indicators Aching;Tightness   Pain Type Acute pain   Pain Radiating Towards arm   Pain Onset More than a month ago   Pain Frequency Constant   Aggravating Factors  movement, lying down   Pain Relieving Factors rest, sling   Effect of Pain on Daily Activities decreased use of right hand and arm with functional actiivties  [facility] OT Assessment - 03/20/16 0001      Assessment   Diagnosis S/P Right RCR, SAD, DCE    Referring Provider Dr. Arther Abbott   Onset Date 01/30/16     Precautions   Precautions Shoulder   Shoulder Interventions Shoulder sling/immobilizer   Precaution Comments P/ROM 2/22-3/30 then progress to AA/ROM, A/ROM, and strengthening as tolerated      Restrictions   Weight Bearing Restrictions No     Balance Screen   Has the patient fallen in the past 6 months No   Has the patient had a decrease in activity level because of a fear of falling?  No   Is the patient reluctant to leave their home because of a fear of falling?  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     Written Expression   Dominant Hand Right     Vision - History   Baseline Vision Wears glasses all the time     Cognition   Overall Cognitive Status Within Functional Limits for tasks assessed     Observation/Other Assessments   Focus on Therapeutic Outcomes (FOTO)  18/100     Sensation   Light Touch Appears Intact     Coordination   Gross Motor Movements are Fluid and Coordinated Yes   Fine Motor Movements are Fluid and Coordinated Yes     ROM / Strength   AROM / PROM / Strength AROM;PROM;Strength     Palpation   Palpation comment mod-max fascial restrictions noted in her right shoudler region     AROM   Overall AROM Comments not assessed due t recent surgery   AROM Assessment Site Shoulder   Right/Left Shoulder Right     PROM   Overall PROM Comments assessed in supine, External and internal rotation with shoudler adducted   PROM Assessment Site Shoulder   Right/Left Shoulder Right   Right Shoulder Flexion 79 Degrees   Right Shoulder ABduction 45 Degrees   Right Shoulder Internal Rotation 80 Degrees   Right Shoulder External Rotation 15 Degrees   Right/Left Elbow Right   Right Elbow Flexion 140   Right Elbow Extension 25     Strength   Overall Strength Comments not assessed due to recent surgery    Strength Assessment Site Shoulder   Right/Left Shoulder Right                  OT Treatments/Exercises (OP) - 03/20/16 0001      Manual Therapy   Manual Therapy Myofascial release   Manual therapy comments manual therapy completed  seperately from all other interventions this date    Myofascial Release myofascial release and manual stretching to right upper arm, scapular, and shoulder region to decrease pain and restrictions and imrpove pain free mobility in right shoulder.                OT Education - 03/20/16 1431    Education provided Yes   Education Details Patient educated on HEP for towel  slides, pendulums, A/ROM elbow- hand   Person(s) Educated Patient   Methods Explanation;Handout;Demonstration   Comprehension Verbalized understanding;Returned demonstration          OT Short Term Goals - 03/20/16 1441      OT SHORT TERM GOAL #1   Title Patient will be educated on HEP for imroved functional use of right shoulder.    Time 6   Period Weeks   Status New     OT SHORT TERM GOAL #2   Title Patient will improve right shoulder and elbow P/ROM to WNL for improved ease donning and doffing clothing.    Time 6   Period Weeks   Status New     OT SHORT TERM GOAL #3   Title Patient will improve right shoulder strength to 3+/5 for improved ability to complete activities at waist height.    Time 6   Period Weeks   Status New     OT SHORT TERM GOAL #4   Title Patient will decrease pain in her right shoudler and elbow to 4/10 during functional use.   Time 6   Period Weeks   Status New     OT SHORT TERM GOAL #5   Title Patietn will decrease fascial restrictions to moderate for improved functional use of right arm.    Time 6   Period Weeks   Status New           OT Long Term Goals - 03/20/16 1444      OT LONG TERM GOAL #1   Title Patient will return to prior level of independence with all B/IADLS and leisure activities, using right arm as dominant.    Time 12   Period Weeks   Status New     OT LONG TERM GOAL #2   Title Patient will improve right shoulder and elbow A/ROM to WNL in order to reach into overhead cabinets.   Time 12   Period Weeks   Status New     OT LONG TERM GOAL #3    Title Patient will improve right shoulder and elbow strength to 5/5 for improved ability to lift bags of groceries and baskets of laundry.   Time 12   Period Weeks   Status New     OT LONG TERM GOAL #4   Title Patient will decrease pain in her right shoulder and elbow to 2/10 or better during functional use.    Time 12   Period Weeks   Status New     OT LONG TERM GOAL #5   Title Patient will have trace fascial restrictions in her right shoulder and elbow region for greater functional use of right arm.   Time 12   Period Weeks   Status New               Plan - 03/20/16 1436    Clinical Impression Statement A:  Patient is a 61 year old female with past medical history significant for hypertension and arthritis.  Patient had been experiencing functionally limiting pain and mobility for several years in her right shoulder. 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  She had open rotator cuff repair, subacromial decompression and distal clavicle excision on 01/30/16.  Patient is following Dr. Althia Forts protocol of immobilzation through 03/19/16, P/ROM 2/22-3/30.  Patient is not able to use her dominant right arm with any functional activities due to thes precauations.     Rehab Potential  Good   OT Frequency 2x / week   OT Duration 12 weeks   OT Treatment/Interventions Self-care/ADL training;Cryotherapy;Electrical Stimulation;Moist Heat;Contrast Bath;Parrafin;Ultrasound;Therapeutic exercise;Neuromuscular education;Iontophoresis;Passive range of motion;Scar mobilization;Manual Therapy;Therapeutic exercises;Therapeutic activities;Patient/family education   Plan P:  Skilled OT intervention to decrease pain and restrictions and improve pain free mobility and strength in her right shoulder and elbow region in order to return to prior level of function with daily activities.  Next session, review HEP, begin P/ROM and isometrics.    OT Home Exercise Plan 03/20/16:   towel slides, pendulum, A/ROM elbow-hand   Consulted and Agree with Plan of Care Patient      Patient will benefit from skilled therapeutic intervention in order to improve the following deficits and impairments:  Decreased range of motion, Decreased skin integrity, Decreased scar mobility, Decreased strength, Increased edema, Increased fascial restricitons, Increased muscle spasms, Pain  Visit Diagnosis: Acute pain of right shoulder - Plan: Ot plan of care cert/re-cert  Stiffness of right shoulder, not elsewhere classified - Plan: Ot plan of care cert/re-cert  Other symptoms and signs involving the musculoskeletal system - Plan: Ot plan of care cert/re-cert    Problem List Patient Active Problem List   Diagnosis Date Noted  . Complete tear of right rotator cuff   . Subacromial impingement of right shoulder   . Muscle weakness (generalized) 05/17/2013  . Rotator cuff syndrome 05/17/2013  . Right shoulder pain 03/28/2013  . Varicose veins of lower extremities with other complications [id number]  . Heartburn 07/21/2011  . Ankle pain, chronic 06/02/2011  . HTN (hypertension) 05/20/2011  . OA (osteoarthritis) 05/20/2011  . Varicose veins 05/20/2011  . Obesity (BMI 30-39.9) 05/20/2011    Vangie Bicker, [place], OTR/L [phone]  03/20/2016, 2:49 PM  [facility] [address] [place], [place], [address] Phone: [phone]   Fax:  [phone]  Name: Betty Dawson MRN: [id number] Date of Birth: December 30, 1955

## 2016-03-20 NOTE — Patient Instructions (Signed)
TOWEL SLIDES COMPLETE FOR 1-3 MINUTES, 3-5 TIMES PER DAY  SHOULDER: Flexion On Table   Place hands on table, elbows straight. Slide arms across table on towel. Press hands down into table. 1-3 minutes 2-3 times per day.  Abduction (Passive)   With arm out to side, resting on table, with palm down, slide arm across table bending at your waist.  Complete 1-3 minutes 2-3 times per day.  Copyright  VHI. All rights reserved.     Internal Rotation (Assistive)   Seated with elbow bent at right angle and held against side, slide arm on table surface in an inward arc. 1-3 minutes 2-3 times per day.  Activity: Use this motion to brush crumbs off the table.  Copyright  VHI. All rights reserved.    COMPLETE PENDULUM EXERCISES FOR 30 SECONDS TO A MINUTE EACH, 3-5 TIMES PER DAY. ROM: Pendulum (Side-to-Side)   Swing arm side to side by moving body  http://orth.exer.us/792   Copyright  VHI. All rights reserved.  Pendulum Forward/Back   Bend forward 90 at waist, using table for support. Rock body forward and back to swing arm. Repeat ____ times. Do ____ sessions per day.  Copyright  VHI. All rights reserved.  Pendulum Circular   Bend forward 90 at waist, leaning on table for support. Rock body in a circular pattern to move arm clockwise ____ times then counterclockwise ____ times. Do ____ sessions per day.  Copyright  VHI. All rights reserved.  AROM: Wrist Extension   With right palm down, bend wrist up. Repeat 10____ times per set. Do ____ sets per session. Do __3__ sessions per day.  Copyright  VHI. All rights reserved.   AROM: Wrist Flexion   With right palm up, bend wrist up. Repeat ___10_ times per set. Do ____ sets per session. Do __3__ sessions per day.  Copyright  VHI. All rights reserved.   AROM: Forearm Pronation / Supination   With right arm in handshake position, slowly rotate palm down until stretch is felt. Relax. Then rotate palm up until stretch  is felt. Repeat __10__ times per set. Do ____ sets per session. Do __3__ sessions per day.  Copyright  VHI. All rights reserved.   AFlexion (Passive)   Use other hand to bend elbow, with thumb toward same shoulder. Do NOT force this motion. Hold ____ seconds. Repeat ____ times. Do ____ sessions per day.  Copyright  VHI. All rights reserved.    With left hand palm up, gently bend elbow as far as possible. Then straighten arm as far as possible. Repeat ____ times per set. Do ____ sets per session. Do ____ sessions per day.  Copyright  VHI. All rights reserved.

## 2016-03-23 ENCOUNTER — Other Ambulatory Visit: Payer: Self-pay | Admitting: *Deleted

## 2016-03-23 ENCOUNTER — Ambulatory Visit (HOSPITAL_COMMUNITY): Payer: BLUE CROSS/BLUE SHIELD | Admitting: Specialist

## 2016-03-23 ENCOUNTER — Telehealth: Payer: Self-pay | Admitting: Orthopedic Surgery

## 2016-03-23 DIAGNOSIS — M25511 Pain in right shoulder: Secondary | ICD-10-CM

## 2016-03-23 DIAGNOSIS — M25611 Stiffness of right shoulder, not elsewhere classified: Secondary | ICD-10-CM

## 2016-03-23 DIAGNOSIS — R29898 Other symptoms and signs involving the musculoskeletal system: Secondary | ICD-10-CM

## 2016-03-23 MED ORDER — IBUPROFEN 800 MG PO TABS: 800.0000 mg | ORAL_TABLET | Freq: Three times a day (TID) | ORAL | 5 refills | Status: DC | PRN

## 2016-03-23 NOTE — Telephone Encounter (Signed)
Routing to Dr Harrison for approval 

## 2016-03-23 NOTE — Therapy (Signed)
[place], [place], [address] Phone: [phone]   Fax:  [phone]  Occupational Therapy Treatment  Patient Details  Name: Betty Dawson MRN: [id number] Date of Birth: [birth year] Referring Provider: Dr. Arther Abbott  Encounter Date: 03/23/2016      OT End of Session - 03/23/16 1002    Visit Number 2   Number of Visits 24   Date for OT Re-Evaluation 05/19/16   Authorization Type BCBS   Authorization Time Period 30 visits   Authorization - Visit Number 2   Authorization - Number of Visits 30   OT Start Time 0915   OT Stop Time 0954   OT Time Calculation (min) 39 min   Activity Tolerance Patient tolerated treatment well   Behavior During Therapy [facility] for tasks assessed/performed      Past Medical History:  Diagnosis Date  . Anemia   . Arthritis    OA multiple joints  . Heart murmur   . Heel spur   . Hypertension   . Varicose veins     Past Surgical History:  Procedure Laterality Date  . ABDOMINAL HYSTERECTOMY     endometriosis  . COLONOSCOPY  02/21/2013   [id number] polyp measuring 6 mm in size was found in the distal/transverse colon; polypectomy was performed using snare cautery/The colon was redundant/The colon mucosa was otherwise normal/Normal mucosa in the terminal ileum  . COLONOSCOPY N/A 02/21/2013   Procedure: COLONOSCOPY;  Surgeon: Danie Binder, MD;  [facility];  Service: Endoscopy;  Laterality: N/A;  10:30 AM  . FOOT SURGERY Right    heel spur  . SHOULDER ARTHROSCOPY WITH ROTATOR CUFF REPAIR Right 01/30/2016   Procedure: SHOULDER ARTHROSCOPY WITH ACROMIOPLASTY;  Surgeon: Carole Civil, MD;  Location: [facility];  Service: Orthopedics;  Laterality: Right;  pt knows to arrive at 7:15  . SHOULDER OPEN ROTATOR CUFF REPAIR Right 01/30/2016   Procedure: ROTATOR CUFF REPAIR SHOULDER OPEN;  Surgeon: Carole Civil, MD;  Location: [facility] OR[facility];  Service: Orthopedics;  Laterality:  Right;    There were no vitals filed for this visit.      Subjective Assessment - 03/23/16 0916    Subjective  S:  I feel ok I guess.   Currently in Pain? No/denies   Pain Score 0-No pain            [facility] OT Assessment - 03/23/16 0001      Assessment   Diagnosis S/P Right RCR, SAD, DCE      Precautions   Precautions Shoulder   Shoulder Interventions Shoulder sling/immobilizer   Precaution Comments P/ROM 2/22-3/30 then progress to AA/ROM, A/ROM, and strengthening as tolerated                   OT Treatments/Exercises (OP) - 03/23/16 0001      Exercises   Exercises Shoulder     Shoulder Exercises: Supine   Protraction PROM;10 reps   Horizontal ABduction PROM;10 reps   External Rotation PROM;10 reps   Internal Rotation PROM;10 reps   Flexion PROM;10 reps   ABduction PROM;10 reps   Other Supine Exercises elbow flexion, extension, supination, pronation, wrist flexion, extension 10 times each P/ROM   Other Supine Exercises bridges 10 times     Shoulder Exercises: Standing   Extension AROM;10 reps   Row AROM;10 reps   Shoulder Elevation AROM;10 reps   Other Standing Exercises A/ROM:  elbow flexion, extension, pronation, supination, wrist flexion  and extension 10 times each   Other Standing Exercises pendulum exercises:  vertical, horizontal, circular 15 seconds each with moderate verbal guidance for technique     Shoulder Exercises: Therapy Ball   Flexion 15 reps   ABduction 15 reps     Shoulder Exercises: Isometric Strengthening   Flexion Supine;3X3"   Extension Supine;3X3"   External Rotation Supine;3X3"   Internal Rotation Supine;3X3"   ABduction Supine;3X3"   ADduction Supine;3X3"     Manual Therapy   Manual Therapy Myofascial release   Manual therapy comments manual therapy completed seperately from all other interventions this date    Myofascial Release myofascial release and manual stretching to right upper arm, scapular, and shoulder region  to decrease pain and restrictions and imrpove pain free mobility in right shoulder.                 OT Education - 03/23/16 1001    Education provided Yes   Education Details reviewed Plan of care with patient and issued a copy.  followed up on technique for pendulum exercises   Person(s) Educated Patient   Methods Explanation;Demonstration;Handout;Tactile cues   Comprehension Verbalized understanding;Returned demonstration          OT Short Term Goals - 03/23/16 1005      OT SHORT TERM GOAL #1   Title Patient will be educated on HEP for imroved functional use of right shoulder.    Time 6   Period Weeks   Status On-going     OT SHORT TERM GOAL #2   Title Patient will improve right shoulder and elbow P/ROM to WNL for improved ease donning and doffing clothing.    Time 6   Period Weeks   Status On-going     OT SHORT TERM GOAL #3   Title Patient will improve right shoulder strength to 3+/5 for improved ability to complete activities at waist height.    Time 6   Period Weeks   Status On-going     OT SHORT TERM GOAL #4   Title Patient will decrease pain in her right shoudler and elbow to 4/10 during functional use.   Time 6   Period Weeks   Status On-going     OT SHORT TERM GOAL #5   Title Patietn will decrease fascial restrictions to moderate for improved functional use of right arm.    Time 6   Period Weeks   Status On-going           OT Long Term Goals - 03/23/16 1005      OT LONG TERM GOAL #1   Title Patient will return to prior level of independence with all B/IADLS and leisure activities, using right arm as dominant.    Time 12   Period Weeks   Status On-going     OT LONG TERM GOAL #2   Title Patient will improve right shoulder and elbow A/ROM to WNL in order to reach into overhead cabinets.   Time 12   Period Weeks   Status On-going     OT LONG TERM GOAL #3   Title Patient will improve right shoulder and elbow strength to 5/5 for improved  ability to lift bags of groceries and baskets of laundry.   Time 12   Period Weeks   Status On-going     OT LONG TERM GOAL #4   Title Patient will decrease pain in her right shoulder and elbow to 2/10 or better during functional use.  Time 12   Period Weeks   Status On-going     OT LONG TERM GOAL #5   Title Patient will have trace fascial restrictions in her right shoulder and elbow region for greater functional use of right arm.   Time 12   Period Weeks   Status On-going               Plan - 03/23/16 1002    Clinical Impression Statement A:  initiated P/ROM exercises this date after manual therapy intervention in order to improve P/ROM to [facility] for improved ease with daily activities.  Reeducated patient on technique for pendulum exercises for HEP.  Patient required mod verbal guidance for technique.    Plan P:  increase contraction time to 5" with isometrics.  add anterior and caudal glide.        Patient will benefit from skilled therapeutic intervention in order to improve the following deficits and impairments:  Decreased range of motion, Decreased skin integrity, Decreased scar mobility, Decreased strength, Increased edema, Increased fascial restricitons, Increased muscle spasms, Pain  Visit Diagnosis: Acute pain of right shoulder  Stiffness of right shoulder, not elsewhere classified  Other symptoms and signs involving the musculoskeletal system    Problem List Patient Active Problem List   Diagnosis Date Noted  . Complete tear of right rotator cuff   . Subacromial impingement of right shoulder   . Muscle weakness (generalized) 05/17/2013  . Rotator cuff syndrome 05/17/2013  . Right shoulder pain 03/28/2013  . Varicose veins of lower extremities with other complications [id number]  . Heartburn 07/21/2011  . Ankle pain, chronic 06/02/2011  . HTN (hypertension) 05/20/2011  . OA (osteoarthritis) 05/20/2011  . Varicose veins 05/20/2011  . Obesity (BMI  30-39.9) 05/20/2011    Vangie Bicker, [place], OTR/L [phone]  03/23/2016, 10:06 AM  [place] [address] [place], [place], [address] Phone: [phone]   Fax:  [phone]  Name: Betty Dawson MRN: [id number] Date of Birth: 06-Jul-1955

## 2016-03-23 NOTE — Telephone Encounter (Signed)
OK 

## 2016-03-23 NOTE — Telephone Encounter (Signed)
Ibuprofen 800 mgs.  90 day supply   Sig: Take 1 tablet (800 mg total) by mouth every 8 (eight) hours as needed.

## 2016-03-27 ENCOUNTER — Ambulatory Visit (HOSPITAL_COMMUNITY): Payer: BLUE CROSS/BLUE SHIELD | Admitting: Specialist

## 2016-03-27 ENCOUNTER — Telehealth (HOSPITAL_COMMUNITY): Payer: Self-pay | Admitting: Specialist

## 2016-03-27 NOTE — Telephone Encounter (Signed)
She can not come in today °

## 2016-03-30 ENCOUNTER — Ambulatory Visit (HOSPITAL_COMMUNITY): Payer: BLUE CROSS/BLUE SHIELD | Attending: Orthopedic Surgery | Admitting: Specialist

## 2016-03-30 DIAGNOSIS — R29898 Other symptoms and signs involving the musculoskeletal system: Secondary | ICD-10-CM

## 2016-03-30 DIAGNOSIS — M25611 Stiffness of right shoulder, not elsewhere classified: Secondary | ICD-10-CM | POA: Diagnosis present

## 2016-03-30 DIAGNOSIS — M25511 Pain in right shoulder: Secondary | ICD-10-CM | POA: Insufficient documentation

## 2016-03-30 NOTE — Therapy (Signed)
[place], [place], [address] Phone: [phone]   Fax:  [phone]  Occupational Therapy Treatment  Patient Details  Name: Betty Dawson MRN: [id number] Date of Birth: 14-Oct-1955 Referring Provider: Dr. Arther Abbott  Encounter Date: 03/30/2016      OT End of Session - 03/30/16 1027    Visit Number 3   Number of Visits 24   Date for OT Re-Evaluation 05/19/16  mini reassess 04/18/16   Authorization Type BCBS   Authorization Time Period 30 visits   Authorization - Visit Number 3   Authorization - Number of Visits 30   OT Start Time 0955   OT Stop Time 1030   OT Time Calculation (min) 35 min   Activity Tolerance Patient tolerated treatment well   Behavior During Therapy [facility] for tasks assessed/performed      Past Medical History:  Diagnosis Date  . Anemia   . Arthritis    OA multiple joints  . Heart murmur   . Heel spur   . Hypertension   . Varicose veins     Past Surgical History:  Procedure Laterality Date  . ABDOMINAL HYSTERECTOMY     endometriosis  . COLONOSCOPY  02/21/2013   [id number] polyp measuring 6 mm in size was found in the distal/transverse colon; polypectomy was performed using snare cautery/The colon was redundant/The colon mucosa was otherwise normal/Normal mucosa in the terminal ileum  . COLONOSCOPY N/A 02/21/2013   Procedure: COLONOSCOPY;  Surgeon: Danie Binder, MD;  [facility];  Service: Endoscopy;  Laterality: N/A;  10:30 AM  . FOOT SURGERY Right    heel spur  . SHOULDER ARTHROSCOPY WITH ROTATOR CUFF REPAIR Right 01/30/2016   Procedure: SHOULDER ARTHROSCOPY WITH ACROMIOPLASTY;  Surgeon: Carole Civil, MD;  Location: [facility];  Service: Orthopedics;  Laterality: Right;  pt knows to arrive at 7:15  . SHOULDER OPEN ROTATOR CUFF REPAIR Right 01/30/2016   Procedure: ROTATOR CUFF REPAIR SHOULDER OPEN;  Surgeon: Carole Civil, MD;  Location: [facility];  Service:  Orthopedics;  Laterality: Right;    There were no vitals filed for this visit.      Subjective Assessment - 03/30/16 1017    Subjective  S:  My elbow stays sore and I have a burning feeling in my scar, other than that, no pain.   Currently in Pain? Yes   Pain Score 1    Pain Location Shoulder   Pain Orientation Right   Pain Descriptors / Indicators Burning   Pain Type Acute pain            [facility] OT Assessment - 03/30/16 0001      Assessment   Diagnosis S/P Right RCR, SAD, DCE    Referring Provider Dr. Arther Abbott   Onset Date 01/30/16     Precautions   Precautions Shoulder   Shoulder Interventions Shoulder sling/immobilizer   Precaution Comments P/ROM 2/22-3/30 then progress to AA/ROM, A/ROM, and strengthening as tolerated                   OT Treatments/Exercises (OP) - 03/30/16 0001      Exercises   Exercises Shoulder     Shoulder Exercises: Supine   Protraction PROM;10 reps   Horizontal ABduction PROM;10 reps   External Rotation PROM;10 reps   Internal Rotation PROM;10 reps   Flexion PROM;10 reps   ABduction PROM;10 reps   Other Supine Exercises elbow flexion, extension, supination, pronation,  wrist flexion, extension 15 times each P/ROM   Other Supine Exercises bridges 15 times     Shoulder Exercises: Standing   Extension AROM;15 reps   Row AROM;15 reps   Shoulder Elevation AROM;15 reps   Other Standing Exercises A/ROM:  elbow flexion, extension, pronation, supination, wrist flexion and extension 10 times each     Shoulder Exercises: Therapy Ball   Flexion 15 reps   ABduction 15 reps     Shoulder Exercises: ROM/Strengthening   Anterior Glide 5 X 5"   Caudal Glide 5 X 5"   Other ROM/Strengthening Exercises isometric elbow flexion and extension 5 X 5"      Shoulder Exercises: Isometric Strengthening   Flexion Supine;5X5"   Extension Supine;5X5"   External Rotation Supine;5X5"   Internal Rotation Supine;5X5"   ABduction Supine;5X5"    ADduction Supine;5X5"     Manual Therapy   Manual Therapy Myofascial release   Manual therapy comments manual therapy completed seperately from all other interventions this date    Myofascial Release myofascial release and manual stretching to right upper arm, scapular, and shoulder region to decrease pain and restrictions and imrpove pain free mobility in right shoulder.                   OT Short Term Goals - 03/23/16 1005      OT SHORT TERM GOAL #1   Title Patient will be educated on HEP for imroved functional use of right shoulder.    Time 6   Period Weeks   Status On-going     OT SHORT TERM GOAL #2   Title Patient will improve right shoulder and elbow P/ROM to WNL for improved ease donning and doffing clothing.    Time 6   Period Weeks   Status On-going     OT SHORT TERM GOAL #3   Title Patient will improve right shoulder strength to 3+/5 for improved ability to complete activities at waist height.    Time 6   Period Weeks   Status On-going     OT SHORT TERM GOAL #4   Title Patient will decrease pain in her right shoudler and elbow to 4/10 during functional use.   Time 6   Period Weeks   Status On-going     OT SHORT TERM GOAL #5   Title Patietn will decrease fascial restrictions to moderate for improved functional use of right arm.    Time 6   Period Weeks   Status On-going           OT Long Term Goals - 03/23/16 1005      OT LONG TERM GOAL #1   Title Patient will return to prior level of independence with all B/IADLS and leisure activities, using right arm as dominant.    Time 12   Period Weeks   Status On-going     OT LONG TERM GOAL #2   Title Patient will improve right shoulder and elbow A/ROM to WNL in order to reach into overhead cabinets.   Time 12   Period Weeks   Status On-going     OT LONG TERM GOAL #3   Title Patient will improve right shoulder and elbow strength to 5/5 for improved ability to lift bags of groceries and baskets  of laundry.   Time 12   Period Weeks   Status On-going     OT LONG TERM GOAL #4   Title Patient will decrease pain in her right shoulder and  elbow to 2/10 or better during functional use.    Time 12   Period Weeks   Status On-going     OT LONG TERM GOAL #5   Title Patient will have trace fascial restrictions in her right shoulder and elbow region for greater functional use of right arm.   Time 12   Period Weeks   Status On-going               Plan - 03/30/16 1028    Clinical Impression Statement A:  P/ROM limited to approximately 100 degrees flexion and abduction.  increased contraction time with isometrics.     Plan P:  Increase contraction time with anterior and caudal glide.  Add low thumbtacks and prot/ret/elev/depr   Consulted and Agree with Plan of Care Patient      Patient will benefit from skilled therapeutic intervention in order to improve the following deficits and impairments:  Decreased range of motion, Decreased skin integrity, Decreased scar mobility, Decreased strength, Increased edema, Increased fascial restricitons, Increased muscle spasms, Pain  Visit Diagnosis: Acute pain of right shoulder  Stiffness of right shoulder, not elsewhere classified  Other symptoms and signs involving the musculoskeletal system    Problem List Patient Active Problem List   Diagnosis Date Noted  . Complete tear of right rotator cuff   . Subacromial impingement of right shoulder   . Muscle weakness (generalized) 05/17/2013  . Rotator cuff syndrome 05/17/2013  . Right shoulder pain 03/28/2013  . Varicose veins of lower extremities with other complications 76/19/5093  . Heartburn 07/21/2011  . Ankle pain, chronic 06/02/2011  . HTN (hypertension) 05/20/2011  . OA (osteoarthritis) 05/20/2011  . Varicose veins 05/20/2011  . Obesity (BMI 30-39.9) 05/20/2011    Vangie Bicker, [place], OTR/L [phone]  03/30/2016, 10:31 AM  [place] [address] [place], [place], [address] Phone: [phone]   Fax:  [phone]  Name: Betty Dawson MRN: [id number] Date of Birth: [birth year]

## 2016-04-03 ENCOUNTER — Encounter (HOSPITAL_COMMUNITY): Payer: Self-pay

## 2016-04-03 ENCOUNTER — Ambulatory Visit (HOSPITAL_COMMUNITY): Payer: BLUE CROSS/BLUE SHIELD

## 2016-04-03 DIAGNOSIS — R29898 Other symptoms and signs involving the musculoskeletal system: Secondary | ICD-10-CM

## 2016-04-03 DIAGNOSIS — M25511 Pain in right shoulder: Secondary | ICD-10-CM

## 2016-04-03 DIAGNOSIS — M25611 Stiffness of right shoulder, not elsewhere classified: Secondary | ICD-10-CM

## 2016-04-03 NOTE — Therapy (Signed)
[place], [place], [address] Phone: [phone]   Fax:  [phone]  Occupational Therapy Treatment  Patient Details  Name: Betty Dawson MRN: [id number] Date of Birth: 1955-03-31 Referring Provider: Dr. Arther Abbott  Encounter Date: 04/03/2016      OT End of Session - 04/03/16 1106    Visit Number 4   Number of Visits 24   Date for OT Re-Evaluation 05/19/16  mini reassess 04/18/16   Authorization Type BCBS   Authorization Time Period 30 visits   Authorization - Visit Number 4   Authorization - Number of Visits 30   OT Start Time 0900   OT Stop Time 0945   OT Time Calculation (min) 45 min   Activity Tolerance Patient tolerated treatment well   Behavior During Therapy [facility] for tasks assessed/performed      Past Medical History:  Diagnosis Date  . Anemia   . Arthritis    OA multiple joints  . Heart murmur   . Heel spur   . Hypertension   . Varicose veins     Past Surgical History:  Procedure Laterality Date  . ABDOMINAL HYSTERECTOMY     endometriosis  . COLONOSCOPY  02/21/2013   [id number] polyp measuring 6 mm in size was found in the distal/transverse colon; polypectomy was performed using snare cautery/The colon was redundant/The colon mucosa was otherwise normal/Normal mucosa in the terminal ileum  . COLONOSCOPY N/A 02/21/2013   Procedure: COLONOSCOPY;  Surgeon: Danie Binder, MD;  [facility];  Service: Endoscopy;  Laterality: N/A;  10:30 AM  . FOOT SURGERY Right    heel spur  . SHOULDER ARTHROSCOPY WITH ROTATOR CUFF REPAIR Right 01/30/2016   Procedure: SHOULDER ARTHROSCOPY WITH ACROMIOPLASTY;  Surgeon: Carole Civil, MD;  Location: [facility];  Service: Orthopedics;  Laterality: Right;  pt knows to arrive at 7:15  . SHOULDER OPEN ROTATOR CUFF REPAIR Right 01/30/2016   Procedure: ROTATOR CUFF REPAIR SHOULDER OPEN;  Surgeon: Carole Civil, MD;  Location: [facility];  Service:  Orthopedics;  Laterality: Right;    There were no vitals filed for this visit.      Subjective Assessment - 04/03/16 0903    Subjective  S: My elbow gets stiff sometimes from the sling.   Currently in Pain? No/denies            [facility] OT Assessment - 04/03/16 0915      Assessment   Diagnosis S/P Right RCR, SAD, DCE      Precautions   Precautions Shoulder   Shoulder Interventions Shoulder sling/immobilizer   Precaution Comments P/ROM 2/22-3/30 then progress to AA/ROM, A/ROM, and strengthening as tolerated                   OT Treatments/Exercises (OP) - 04/03/16 0921      Exercises   Exercises Shoulder     Shoulder Exercises: Supine   Protraction PROM;10 reps   Horizontal ABduction PROM;10 reps   External Rotation PROM;10 reps   Internal Rotation PROM;10 reps   Flexion PROM;10 reps   ABduction PROM;10 reps   Other Supine Exercises elbow flexion, extension, supination, pronation, wrist flexion, extension 15 times each P/ROM     Shoulder Exercises: Seated   Elevation AROM;15 reps   Extension AROM;15 reps   Row AROM;15 reps     Shoulder Exercises: Therapy Ball   Flexion 15 reps   ABduction 15 reps     Shoulder  Exercises: ROM/Strengthening   Thumb Tacks 1'  low level with max difficulty   Anterior Glide 5x10"   Caudal Glide 5x10"   Prot/Ret//Elev/Dep 1'     Shoulder Exercises: Isometric Strengthening   Flexion Supine;5X5"   Extension Supine;5X5"   External Rotation Supine;5X5"   Internal Rotation Supine;5X5"   ABduction Supine;5X5"   ADduction Supine;5X5"     Manual Therapy   Manual Therapy Myofascial release   Manual therapy comments manual therapy completed seperately from all other interventions this date    Myofascial Release myofascial release and manual stretching to right upper arm, scapular, and shoulder region to decrease pain and restrictions and imrpove pain free mobility in right shoulder.                   OT Short  Term Goals - 03/23/16 1005      OT SHORT TERM GOAL #1   Title Patient will be educated on HEP for imroved functional use of right shoulder.    Time 6   Period Weeks   Status On-going     OT SHORT TERM GOAL #2   Title Patient will improve right shoulder and elbow P/ROM to WNL for improved ease donning and doffing clothing.    Time 6   Period Weeks   Status On-going     OT SHORT TERM GOAL #3   Title Patient will improve right shoulder strength to 3+/5 for improved ability to complete activities at waist height.    Time 6   Period Weeks   Status On-going     OT SHORT TERM GOAL #4   Title Patient will decrease pain in her right shoudler and elbow to 4/10 during functional use.   Time 6   Period Weeks   Status On-going     OT SHORT TERM GOAL #5   Title Patietn will decrease fascial restrictions to moderate for improved functional use of right arm.    Time 6   Period Weeks   Status On-going           OT Long Term Goals - 03/23/16 1005      OT LONG TERM GOAL #1   Title Patient will return to prior level of independence with all B/IADLS and leisure activities, using right arm as dominant.    Time 12   Period Weeks   Status On-going     OT LONG TERM GOAL #2   Title Patient will improve right shoulder and elbow A/ROM to WNL in order to reach into overhead cabinets.   Time 12   Period Weeks   Status On-going     OT LONG TERM GOAL #3   Title Patient will improve right shoulder and elbow strength to 5/5 for improved ability to lift bags of groceries and baskets of laundry.   Time 12   Period Weeks   Status On-going     OT LONG TERM GOAL #4   Title Patient will decrease pain in her right shoulder and elbow to 2/10 or better during functional use.    Time 12   Period Weeks   Status On-going     OT LONG TERM GOAL #5   Title Patient will have trace fascial restrictions in her right shoulder and elbow region for greater functional use of right arm.   Time 12   Period  Weeks   Status On-going               Plan - 04/03/16 1107  Clinical Impression Statement A: Added thumb tacks and pro/ret/elev/dep. patient completed thumb tacks with increased difficulty and several rest breaks due to fatigue and weakness. VC for form and technique as needed during session.   Plan P: Continue to follow protocol. Work on increasing independence with thumb tacks.      Patient will benefit from skilled therapeutic intervention in order to improve the following deficits and impairments:  Decreased range of motion, Decreased skin integrity, Decreased scar mobility, Decreased strength, Increased edema, Increased fascial restricitons, Increased muscle spasms, Pain  Visit Diagnosis: Acute pain of right shoulder  Stiffness of right shoulder, not elsewhere classified  Other symptoms and signs involving the musculoskeletal system    Problem List Patient Active Problem List   Diagnosis Date Noted  . Complete tear of right rotator cuff   . Subacromial impingement of right shoulder   . Muscle weakness (generalized) 05/17/2013  . Rotator cuff syndrome 05/17/2013  . Right shoulder pain 03/28/2013  . 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 Heartburn 07/21/2011  . Ankle pain, chronic 06/02/2011  . HTN (hypertension) 05/20/2011  . OA (osteoarthritis) 05/20/2011  . Varicose veins 05/20/2011  . Obesity (BMI 30-39.9) 05/20/2011   Ailene Ravel, OTR/L,CBIS  [phone]  04/03/2016, 11:10 AM  [facility] [address] [place], [place], [address] Phone: [phone]   Fax:  [phone]  Name: Betty Dawson MRN: [id number] Date of Birth: Jun 26, 1955

## 2016-04-06 ENCOUNTER — Ambulatory Visit (HOSPITAL_COMMUNITY): Payer: BLUE CROSS/BLUE SHIELD | Admitting: Specialist

## 2016-04-06 ENCOUNTER — Telehealth (HOSPITAL_COMMUNITY): Payer: Self-pay | Admitting: Family Medicine

## 2016-04-06 NOTE — Telephone Encounter (Signed)
04/06/16 pt left a message to cx but no reason was given

## 2016-04-10 ENCOUNTER — Ambulatory Visit (HOSPITAL_COMMUNITY): Payer: BLUE CROSS/BLUE SHIELD | Admitting: Specialist

## 2016-04-10 DIAGNOSIS — M25511 Pain in right shoulder: Secondary | ICD-10-CM | POA: Diagnosis not present

## 2016-04-10 DIAGNOSIS — R29898 Other symptoms and signs involving the musculoskeletal system: Secondary | ICD-10-CM

## 2016-04-10 DIAGNOSIS — M25611 Stiffness of right shoulder, not elsewhere classified: Secondary | ICD-10-CM

## 2016-04-10 NOTE — Patient Instructions (Signed)
Strengthening: Isometric Flexion  Using wall for resistance, press right fist into ball using light pressure. Hold __10__ seconds. Repeat __5__ times per set.   SHOULDER: Abduction (Isometric)  Use wall as resistance. Press right arm against pillow. Keep elbow straight. Hold _10__ seconds. __3_ reps   Extension (Isometric)  Place right bent elbow and back of arm against wall. Press elbow against wall. Hold  10____ seconds. Repeat _3-5___ times.   Internal Rotation (Isometric)  Place palm of right fist against door frame, with elbow bent. Press fist against door frame. Hold __10__ seconds. Repeat _3-5___ times.  External Rotation (Isometric)  Place back of right fist against door frame, with elbow bent. Press fist against door frame. Hold _10-15___ seconds. Repeat __3-5__ times. Copyright  [employer]. All rights reserved.

## 2016-04-10 NOTE — Therapy (Signed)
[place], [place], [address] Phone: [phone]   Fax:  [phone]  Occupational Therapy Treatment  Patient Details  Name: Betty Dawson MRN: [id number] Date of Birth: 20-Nov-1955 Referring Provider: Dr. Arther Abbott   Encounter Date: 04/10/2016      OT End of Session - 04/10/16 0949    Visit Number 5   Number of Visits 24   Date for OT Re-Evaluation 05/19/16  mini reassess on 04/18/16   Authorization Type BCBS   Authorization Time Period 30 visits   Authorization - Visit Number 5   Authorization - Number of Visits 30   OT Start Time [phone]   OT Stop Time 0945   OT Time Calculation (min) 40 min   Activity Tolerance Patient tolerated treatment well   Behavior During Therapy [facility] for tasks assessed/performed      Past Medical History:  Diagnosis Date  . Anemia   . Arthritis    OA multiple joints  . Heart murmur   . Heel spur   . Hypertension   . Varicose veins     Past Surgical History:  Procedure Laterality Date  . ABDOMINAL HYSTERECTOMY     endometriosis  . COLONOSCOPY  02/21/2013   MNO:TRRNHA polyp measuring 6 mm in size was found in the distal/transverse colon; polypectomy was performed using snare cautery/The colon was redundant/The colon mucosa was otherwise normal/Normal mucosa in the terminal ileum  . COLONOSCOPY N/A 02/21/2013   Procedure: COLONOSCOPY;  Surgeon: Danie Binder, MD;  [facility];  Service: Endoscopy;  Laterality: N/A;  10:30 AM  . FOOT SURGERY Right    heel spur  . SHOULDER ARTHROSCOPY WITH ROTATOR CUFF REPAIR Right 01/30/2016   Procedure: SHOULDER ARTHROSCOPY WITH ACROMIOPLASTY;  Surgeon: Carole Civil, MD;  Location: [facility];  Service: Orthopedics;  Laterality: Right;  pt knows to arrive at 7:15  . SHOULDER OPEN ROTATOR CUFF REPAIR Right 01/30/2016   Procedure: ROTATOR CUFF REPAIR SHOULDER OPEN;  Surgeon: Carole Civil, MD;  Location: [facility] OR[facility];  Service:  Orthopedics;  Laterality: Right;    There were no vitals filed for this visit.      Subjective Assessment - 04/10/16 0948    Subjective  S:  I think its doing better.    Currently in Pain? No/denies            [facility] OT Assessment - 04/10/16 0001      Assessment   Diagnosis S/P Right RCR, SAD, DCE    Referring Provider Dr. Arther Abbott      Precautions   Precautions Shoulder   Shoulder Interventions Shoulder sling/immobilizer   Precaution Comments P/ROM 2/22-3/30 then progress to AA/ROM, A/ROM, and strengthening as tolerated                   OT Treatments/Exercises (OP) - 04/10/16 0001      Exercises   Exercises Shoulder     Shoulder Exercises: Supine   Protraction PROM;10 reps   Horizontal ABduction PROM;10 reps   External Rotation PROM;10 reps   Internal Rotation PROM;10 reps   Flexion PROM;10 reps   ABduction PROM;10 reps   Other Supine Exercises serratus anterior punch 10 times with therapist unweighting arm    Other Supine Exercises bridges 20 times     Shoulder Exercises: Seated   Elevation AROM;15 reps   Extension AROM;15 reps   Row AROM;15 reps     Shoulder Exercises: Therapy Diona Foley  Flexion 20 reps   Flexion Limitations verbal guidance to hold stretch at end range   ABduction 20 reps     Shoulder Exercises: ROM/Strengthening   Thumb Tacks 1' low   Anterior Glide 5x10"   Caudal Glide 5x10"   Prot/Ret//Elev/Dep 1' with max verbal guidance for technique     Shoulder Exercises: Isometric Strengthening   Flexion Supine;5X5"   Extension Supine;5X5"   External Rotation Supine;5X5"   Internal Rotation Supine;5X5"   ABduction Supine;5X5"   ADduction Supine;5X5"     Manual Therapy   Manual Therapy Myofascial release   Manual therapy comments manual therapy completed seperately from all other interventions this date    Myofascial Release myofascial release and manual stretching to right upper arm, scapular, and shoulder region to  decrease pain and restrictions and imrpove pain free mobility in right shoulder.                 OT Education - 04/10/16 [phone]    Education provided Yes   Education Details instructed patient on HEP for isometric strengthening of shoulder    Person(s) Educated Patient   Methods Explanation;Demonstration;Handout   Comprehension Verbalized understanding;Returned demonstration          OT Short Term Goals - 03/23/16 1005      OT SHORT TERM GOAL #1   Title Patient will be educated on HEP for imroved functional use of right shoulder.    Time 6   Period Weeks   Status On-going     OT SHORT TERM GOAL #2   Title Patient will improve right shoulder and elbow P/ROM to WNL for improved ease donning and doffing clothing.    Time 6   Period Weeks   Status On-going     OT SHORT TERM GOAL #3   Title Patient will improve right shoulder strength to 3+/5 for improved ability to complete activities at waist height.    Time 6   Period Weeks   Status On-going     OT SHORT TERM GOAL #4   Title Patient will decrease pain in her right shoudler and elbow to 4/10 during functional use.   Time 6   Period Weeks   Status On-going     OT SHORT TERM GOAL #5   Title Patietn will decrease fascial restrictions to moderate for improved functional use of right arm.    Time 6   Period Weeks   Status On-going           OT Long Term Goals - 03/23/16 1005      OT LONG TERM GOAL #1   Title Patient will return to prior level of independence with all B/IADLS and leisure activities, using right arm as dominant.    Time 12   Period Weeks   Status On-going     OT LONG TERM GOAL #2   Title Patient will improve right shoulder and elbow A/ROM to WNL in order to reach into overhead cabinets.   Time 12   Period Weeks   Status On-going     OT LONG TERM GOAL #3   Title Patient will improve right shoulder and elbow strength to 5/5 for improved ability to lift bags of groceries and baskets of  laundry.   Time 12   Period Weeks   Status On-going     OT LONG TERM GOAL #4   Title Patient will decrease pain in her right shoulder and elbow to 2/10 or better during functional use.  Time 12   Period Weeks   Status On-going     OT LONG TERM GOAL #5   Title Patient will have trace fascial restrictions in her right shoulder and elbow region for greater functional use of right arm.   Time 12   Period Weeks   Status On-going               Plan - 04/10/16 0950    Clinical Impression Statement A:  patient required less facilitation with thumb tacks exercise.  Patient did require tactile facilitation for technique of prot/ret/elev/dep however was able to complete for the entire minute.  added isometrics to HEP as patient demonstrates weakness with reaching forward to waist height to place arm on therapy ball.     Plan P:  Follow up on HEP.  Attempt to complete prot/ret/elev/dep without assist from therapist.     OT Home Exercise Plan 03/20/16:  towel slides, pendulum, A/ROM elbow-hand; 04/10/16:  isometric shoulder strengthening.       Patient will benefit from skilled therapeutic intervention in order to improve the following deficits and impairments:  Decreased range of motion, Decreased skin integrity, Decreased scar mobility, Decreased strength, Increased edema, Increased fascial restricitons, Increased muscle spasms, Pain  Visit Diagnosis: Acute pain of right shoulder  Stiffness of right shoulder, not elsewhere classified  Other symptoms and signs involving the musculoskeletal system    Problem List Patient Active Problem List   Diagnosis Date Noted  . Complete tear of right rotator cuff   . Subacromial impingement of right shoulder   . Muscle weakness (generalized) 05/17/2013  . Rotator cuff syndrome 05/17/2013  . Right shoulder pain 03/28/2013  . Varicose veins of lower extremities with other complications 35/46/5681  . Heartburn 07/21/2011  . Ankle pain,  chronic 06/02/2011  . HTN (hypertension) 05/20/2011  . OA (osteoarthritis) 05/20/2011  . Varicose veins 05/20/2011  . Obesity (BMI 30-39.9) 05/20/2011    Vangie Bicker, [place], OTR/L [phone]  04/10/2016, 11:12 AM  [facility] [address] [place], [place], [address] Phone: [phone]   Fax:  [phone]  Name: LAJADA JANES MRN: [id number] Date of Birth: 1955-07-22

## 2016-04-13 ENCOUNTER — Ambulatory Visit (HOSPITAL_COMMUNITY): Payer: BLUE CROSS/BLUE SHIELD | Admitting: Specialist

## 2016-04-13 DIAGNOSIS — M25511 Pain in right shoulder: Secondary | ICD-10-CM

## 2016-04-13 DIAGNOSIS — R29898 Other symptoms and signs involving the musculoskeletal system: Secondary | ICD-10-CM

## 2016-04-13 DIAGNOSIS — M25611 Stiffness of right shoulder, not elsewhere classified: Secondary | ICD-10-CM

## 2016-04-13 NOTE — Therapy (Addendum)
[facility] [address] [place], [place], [address] Phone: [phone]   Fax:  [phone]  Occupational Therapy Treatment  Patient Details  Name: Betty Dawson MRN: [id number] Date of Birth: 16-Sep-1955 Referring Provider: Dr. Arther Abbott   Encounter Date: 04/13/2016    Past Medical History:  Diagnosis Date  . Anemia   . Arthritis    OA multiple joints  . Heart murmur   . Heel spur   . Hypertension   . Varicose veins     Past Surgical History:  Procedure Laterality Date  . ABDOMINAL HYSTERECTOMY     endometriosis  . COLONOSCOPY  02/21/2013   HAL:[id number] polyp measuring 6 mm in size was found in the distal/transverse colon; polypectomy was performed using snare cautery/The colon was redundant/The colon mucosa was otherwise normal/Normal mucosa in the terminal ileum  . COLONOSCOPY N/A 02/21/2013   Procedure: COLONOSCOPY;  Surgeon: Danie Binder, MD;  [facility];  Service: Endoscopy;  Laterality: N/A;  10:30 AM  . FOOT SURGERY Right    heel spur  . SHOULDER ARTHROSCOPY WITH ROTATOR CUFF REPAIR Right 01/30/2016   Procedure: SHOULDER ARTHROSCOPY WITH ACROMIOPLASTY;  Surgeon: Carole Civil, MD;  Location: [facility];  Service: Orthopedics;  Laterality: Right;  pt knows to arrive at 7:15  . SHOULDER OPEN ROTATOR CUFF REPAIR Right 01/30/2016   Procedure: ROTATOR CUFF REPAIR SHOULDER OPEN;  Surgeon: Carole Civil, MD;  Location: [facility];  Service: Orthopedics;  Laterality: Right;    There were no vitals filed for this visit.                              OT Short Term Goals - 04/13/16 0932      OT SHORT TERM GOAL #1   Status On-going     OT SHORT TERM GOAL #2   Status On-going     OT SHORT TERM GOAL #3   Status On-going     OT SHORT TERM GOAL #4   Status On-going     OT SHORT TERM GOAL #5   Status On-going           OT Long Term Goals - 04/13/16 0933      OT LONG TERM  GOAL #1   Title Patient will return to prior level of independence with all B/IADLS and leisure activities, using right arm as dominant.    Time 12   Period Weeks   Status On-going     OT LONG TERM GOAL #2   Title Patient will improve right shoulder and elbow A/ROM to WNL in order to reach into overhead cabinets.   Time 12   Period Weeks   Status On-going     OT LONG TERM GOAL #3   Title Patient will improve right shoulder and elbow strength to 5/5 for improved ability to lift bags of groceries and baskets of laundry.   Time 12   Period Weeks   Status On-going     OT LONG TERM GOAL #4   Title Patient will decrease pain in her right shoulder and elbow to 2/10 or better during functional use.    Time 12   Period Weeks   Status On-going     OT LONG TERM GOAL #5   Title Patient will have trace fascial restrictions in her right shoulder and elbow region for greater functional use of right arm.   Time  12   Period Weeks   Status On-going               Plan - 04/16/16 1421    Clinical Impression Statement A:  Patient tolerated treatmetn well.  Patient able to tolerate near full P/ROM this date with external rotation being most difficult.  Increased contraction time with isometric strengthening. demonstrated improved ease with prot/ret/elev/dep   Plan P:  Increase isometric contraction time to 15", add serratus anterior punch for improved scapular strength, reassess.      Patient will benefit from skilled therapeutic intervention in order to improve the following deficits and impairments:  Decreased range of motion, Decreased skin integrity, Decreased scar mobility, Decreased strength, Increased edema, Increased fascial restricitons, Increased muscle spasms, Pain  Visit Diagnosis: Acute pain of right shoulder  Stiffness of right shoulder, not elsewhere classified  Other symptoms and signs involving the musculoskeletal system    Problem List Patient Active Problem List    Diagnosis Date Noted  . Complete tear of right rotator cuff   . Subacromial impingement of right shoulder   . Muscle weakness (generalized) 05/17/2013  . Rotator cuff syndrome 05/17/2013  . Right shoulder pain 03/28/2013  . Varicose veins of lower extremities with other complications 07/37/1062  . Heartburn 07/21/2011  . Ankle pain, chronic 06/02/2011  . HTN (hypertension) 05/20/2011  . OA (osteoarthritis) 05/20/2011  . Varicose veins 05/20/2011  . Obesity (BMI 30-39.9) 05/20/2011    Betty Dawson, [place], OTR/L [phone]  04/16/2016, 2:24 PM  [facility] [address] [place], [place], [address] Phone: [phone]   Fax:  [phone]  Name: Betty Dawson MRN: [id number] Date of Birth: [birth year]

## 2016-04-17 ENCOUNTER — Ambulatory Visit (HOSPITAL_COMMUNITY): Payer: BLUE CROSS/BLUE SHIELD | Admitting: Specialist

## 2016-04-17 DIAGNOSIS — R29898 Other symptoms and signs involving the musculoskeletal system: Secondary | ICD-10-CM

## 2016-04-17 DIAGNOSIS — M25511 Pain in right shoulder: Secondary | ICD-10-CM

## 2016-04-17 DIAGNOSIS — M25611 Stiffness of right shoulder, not elsewhere classified: Secondary | ICD-10-CM

## 2016-04-17 NOTE — Therapy (Signed)
[place], [place], [address] Phone: [phone]   Fax:  [phone]  Occupational Therapy Treatment  Patient Details  Name: Betty Dawson MRN: [id number] Date of Birth: 1955-11-21 Referring Provider: Dr. Arther Abbott  Encounter Date: 04/17/2016      OT End of Session - 04/17/16 0945    Visit Number 7   Number of Visits 24   Date for OT Re-Evaluation 05/19/16   Authorization Type BC[employer]   Authorization Time Period 30 visits   Authorization - Visit Number 7   Authorization - Number of Visits 30   OT Start Time [phone]   OT Stop Time 0946   OT Time Calculation (min) 42 min   Activity Tolerance Patient tolerated treatment well   Behavior During Therapy [facility] for tasks assessed/performed      Past Medical History:  Diagnosis Date  . Anemia   . Arthritis    OA multiple joints  . Heart murmur   . Heel spur   . Hypertension   . Varicose veins     Past Surgical History:  Procedure Laterality Date  . ABDOMINAL HYSTERECTOMY     endometriosis  . COLONOSCOPY  02/21/2013   MBT:[id number] polyp measuring 6 mm in size was found in the distal/transverse colon; polypectomy was performed using snare cautery/The colon was redundant/The colon mucosa was otherwise normal/Normal mucosa in the terminal ileum  . COLONOSCOPY N/A 02/21/2013   Procedure: COLONOSCOPY;  Surgeon: Danie Binder, MD;  [facility];  Service: Endoscopy;  Laterality: N/A;  10:30 AM  . FOOT SURGERY Right    heel spur  . SHOULDER ARTHROSCOPY WITH ROTATOR CUFF REPAIR Right 01/30/2016   Procedure: SHOULDER ARTHROSCOPY WITH ACROMIOPLASTY;  Surgeon: Carole Civil, MD;  Location: [facility];  Service: Orthopedics;  Laterality: Right;  pt knows to arrive at 7:15  . SHOULDER OPEN ROTATOR CUFF REPAIR Right 01/30/2016   Procedure: ROTATOR CUFF REPAIR SHOULDER OPEN;  Surgeon: Carole Civil, MD;  Location: [facility];  Service: Orthopedics;  Laterality:  Right;    There were no vitals filed for this visit.      Subjective Assessment - 04/17/16 0945    Subjective  S: I can get dressed much easier now.   Currently in Pain? No/denies   Pain Score 0-No pain            [facility] OT Assessment - 04/17/16 0001      Assessment   Diagnosis S/P Right RCR, SAD, DCE    Referring Provider Dr. Arther Abbott   Onset Date 01/30/16     Precautions   Precautions Shoulder   Shoulder Interventions Shoulder sling/immobilizer   Precaution Comments P/ROM 2/22-3/30 then progress to AA/ROM, A/ROM, and strengthening as tolerated      Palpation   Palpation comment min-mod fascial restrictions     PROM   Overall PROM Comments assessed in supine, External and internal rotation with shoudler adducted (03/20/16)   Right Shoulder Flexion 145 Degrees  79   Right Shoulder ABduction 100 Degrees  45   Right Shoulder Internal Rotation 90 Degrees  80   Right Shoulder External Rotation 38 Degrees  15   Right Elbow Flexion 152  140   Right Elbow Extension 0  25                  OT Treatments/Exercises (OP) - 04/17/16 0001      Exercises   Exercises Shoulder  Shoulder Exercises: Supine   Protraction PROM;10 reps   Horizontal ABduction PROM;10 reps   External Rotation PROM;10 reps   Internal Rotation PROM;10 reps   Flexion PROM;10 reps   ABduction PROM;10 reps   Other Supine Exercises serratus anterior punch 15 times with therapist unweighting arm    Other Supine Exercises bridges 25 times     Shoulder Exercises: Seated   Elevation AROM;20 reps   Extension AROM;20 reps   Row AROM;20 reps   Other Seated Exercises elbow flexion, extension, supination, pronation, wrist flexion, extension A/ROM 15 times each   Other Seated Exercises seated pvc pipe slide into flexion from 0-45 degrees 10 times with min facilitation, abduction 15-45 10 times with min facilitation     Shoulder Exercises: Therapy Ball   Flexion 20 reps   ABduction  20 reps     Shoulder Exercises: ROM/Strengthening   Thumb Tacks 1' low   Anterior Glide 5x10"   Caudal Glide 5x10"   Prot/Ret//Elev/Dep 1' with great form     Manual Therapy   Manual Therapy Myofascial release   Manual therapy comments manual therapy completed seperately from all other interventions this date    Myofascial Release myofascial release and manual stretching to right upper arm, scapular, and shoulder region to decrease pain and restrictions and imrpove pain free mobility in right shoulder.                   OT Short Term Goals - 04/17/16 0947      OT SHORT TERM GOAL #1   Title Patient will be educated on HEP for imroved functional use of right shoulder.    Time 6   Period Weeks   Status On-going     OT SHORT TERM GOAL #2   Title Patient will improve right shoulder and elbow P/ROM to WNL for improved ease donning and doffing clothing.    Time 6   Period Weeks   Status On-going     OT SHORT TERM GOAL #3   Title Patient will improve right shoulder strength to 3+/5 for improved ability to complete activities at waist height.    Time 6   Period Weeks   Status On-going     OT SHORT TERM GOAL #4   Title Patient will decrease pain in her right shoudler and elbow to 4/10 during functional use.   Time 6   Period Weeks   Status Achieved     OT SHORT TERM GOAL #5   Title Patietn will decrease fascial restrictions to moderate for improved functional use of right arm.    Time 6   Period Weeks   Status Achieved           OT Long Term Goals - 04/17/16 0948      OT LONG TERM GOAL #1   Title Patient will return to prior level of independence with all B/IADLS and leisure activities, using right arm as dominant.    Time 12   Period Weeks   Status On-going     OT LONG TERM GOAL #2   Title Patient will improve right shoulder and elbow A/ROM to WNL in order to reach into overhead cabinets.   Time 12   Period Weeks   Status On-going     OT LONG TERM  GOAL #3   Title Patient will improve right shoulder and elbow strength to 5/5 for improved ability to lift bags of groceries and baskets of laundry.   Time 12  Period Weeks   Status On-going     OT LONG TERM GOAL #4   Title Patient will decrease pain in her right shoulder and elbow to 2/10 or better during functional use.    Time 12   Period Weeks   Status On-going     OT LONG TERM GOAL #5   Title Patient will have trace fascial restrictions in her right shoulder and elbow region for greater functional use of right arm.   Time 12   Period Weeks   Status On-going               Plan - 04/17/16 0946    Clinical Impression Statement A:  Reassessed for MD visit this date.  P/ROM has improved significantly.  Pain has decreased.  Patient reports increased ease with donning and doffing clothing and improved use of right arm at waist height.    OT Frequency 2x / week   OT Duration 8 weeks   OT Treatment/Interventions Self-care/ADL training;Cryotherapy;Electrical Stimulation;Moist Heat;Contrast Bath;Parrafin;Ultrasound;Therapeutic exercise;Neuromuscular education;Iontophoresis;Passive range of motion;Scar mobilization;Manual Therapy;Therapeutic exercises;Therapeutic activities;Patient/family education   Plan P:  Improve P/ROM to WNL, begin AA/ROM as protocol allows on 04/24/16.        Patient will benefit from skilled therapeutic intervention in order to improve the following deficits and impairments:  Decreased range of motion, Decreased skin integrity, Decreased scar mobility, Decreased strength, Increased edema, Increased fascial restricitons, Increased muscle spasms, Pain  Visit Diagnosis: Acute pain of right shoulder  Stiffness of right shoulder, not elsewhere classified  Other symptoms and signs involving the musculoskeletal system    Problem List Patient Active Problem List   Diagnosis Date Noted  . Complete tear of right rotator cuff   . Subacromial impingement of  right shoulder   . Muscle weakness (generalized) 05/17/2013  . Rotator cuff syndrome 05/17/2013  . Right shoulder pain 03/28/2013  . Varicose veins of lower extremities with other complications 64/84/7207  . Heartburn 07/21/2011  . Ankle pain, chronic 06/02/2011  . HTN (hypertension) 05/20/2011  . OA (osteoarthritis) 05/20/2011  . Varicose veins 05/20/2011  . Obesity (BMI 30-39.9) 05/20/2011    Vangie Bicker, [place], OTR/L [phone]  04/17/2016, 9:49 AM  [facility] [address] [place], [place], [address] Phone: [phone]   Fax:  [phone]  Name: ALAZIA CROCKET MRN: [id number] Date of Birth: [birth year]

## 2016-04-20 ENCOUNTER — Ambulatory Visit (HOSPITAL_COMMUNITY): Payer: BLUE CROSS/BLUE SHIELD | Admitting: Specialist

## 2016-04-20 ENCOUNTER — Ambulatory Visit (INDEPENDENT_AMBULATORY_CARE_PROVIDER_SITE_OTHER): Payer: BLUE CROSS/BLUE SHIELD | Admitting: Orthopedic Surgery

## 2016-04-20 ENCOUNTER — Telehealth (HOSPITAL_COMMUNITY): Payer: Self-pay | Admitting: Family Medicine

## 2016-04-20 DIAGNOSIS — Z4889 Encounter for other specified surgical aftercare: Secondary | ICD-10-CM

## 2016-04-20 DIAGNOSIS — Z9889 Other specified postprocedural states: Secondary | ICD-10-CM

## 2016-04-20 NOTE — Patient Instructions (Signed)
REMOVE SLING   OK TO DRIVE

## 2016-04-20 NOTE — Telephone Encounter (Signed)
04/20/16 9:08 patient left a message that her ride hasn't come yet so she was just going to cx appt.

## 2016-04-20 NOTE — Progress Notes (Signed)
Patient ID: Betty Dawson, female   DOB: [birth year], 61 y.o.   MRN: [id number]  POSTOP VISIT   Chief Complaint  Patient presents with  . Follow-up    6 week recheck on right shoulder, RCR, DOS 01-30-16.   She's doing well. Here is her therapy report PROM   Overall PROM Comments assessed in supine, External and internal rotation with shoudler adducted (03/20/16)   Right Shoulder Flexion 145 Degrees  79   Right Shoulder ABduction 100 Degrees  45   Right Shoulder Internal Rotation 90 Degrees  80   Right Shoulder External Rotation 38 Degrees  15   Right Elbow Flexion 152  140   Right Elbow Extension 0  25  She is off all pain medicine  In the office I can externally rotate her 35 I can abductor 85 no pain Incision was clean dry and intact   OP REPORT Preop right rotator cuff tear   Post op same    Procedure arthroscopy right shoulder with limited debridement, open rotator cuff repair right shoulder   Surgeon Aline Brochure   Assisted by Simonne Maffucci   Gen. anesthesia   Operative findings the glenohumeral joint had normal articular surface on the glenoid and humeral side extensive synovitis throughout the joint torn rotator cuff degenerative biceps tendon with intact biceps anchor small nonsurgical tear subscapularis   The subacromial space had bursitis. There was also good visualization of the U-shaped rotator cuff tear.   2/6.5 anchors were used with multiple side to side sutures  MRI IMPRESSION: 1. Complete tear of the supraspinatus tendon with 3.2 cm of retraction. 2. Moderate tendinosis of the infraspinatus tendon. 3. Mild tendinosis of the intraarticular portion of the long head of the biceps tendon.     Electronically Signed   By: Kathreen Devoid   On: 05/09/2015 10:54   Encounter Diagnoses  Name Primary?  . Status post right rotator cuff repair   . Aftercare following surgery Yes    Continue occupational therapy  Remove  sling  Follow-up 6 weeks  11:46 AM Arther Abbott, MD 04/20/2016

## 2016-04-21 ENCOUNTER — Ambulatory Visit: Payer: BLUE CROSS/BLUE SHIELD | Admitting: Family Medicine

## 2016-04-24 ENCOUNTER — Ambulatory Visit (HOSPITAL_COMMUNITY): Payer: BLUE CROSS/BLUE SHIELD | Admitting: Specialist

## 2016-04-24 ENCOUNTER — Ambulatory Visit: Payer: BLUE CROSS/BLUE SHIELD | Admitting: Orthopedic Surgery

## 2016-04-24 DIAGNOSIS — M25611 Stiffness of right shoulder, not elsewhere classified: Secondary | ICD-10-CM

## 2016-04-24 DIAGNOSIS — M25511 Pain in right shoulder: Secondary | ICD-10-CM | POA: Diagnosis not present

## 2016-04-24 DIAGNOSIS — R29898 Other symptoms and signs involving the musculoskeletal system: Secondary | ICD-10-CM

## 2016-04-24 NOTE — Therapy (Signed)
[place], [place], [address] Phone: [phone]   Fax:  [phone]  Occupational Therapy Treatment  Patient Details  Name: Betty Dawson MRN: [id number] Date of Birth: Aug 05, 1955 Referring Provider: Dr. Arther Abbott  Encounter Date: 04/24/2016      OT End of Session - 04/24/16 1034    Visit Number 8   Number of Visits 24   Date for OT Re-Evaluation 05/19/16   Authorization Type BCBS   Authorization Time Period 30 visits   Authorization - Visit Number 8   Authorization - Number of Visits 30   OT Start Time 0945   OT Stop Time 1030   OT Time Calculation (min) 45 min   Activity Tolerance Patient tolerated treatment well   Behavior During Therapy [facility] for tasks assessed/performed      Past Medical History:  Diagnosis Date  . Anemia   . Arthritis    OA multiple joints  . Heart murmur   . Heel spur   . Hypertension   . Varicose veins     Past Surgical History:  Procedure Laterality Date  . ABDOMINAL HYSTERECTOMY     endometriosis  . COLONOSCOPY  02/21/2013   SAY:TKZSWF polyp measuring 6 mm in size was found in the distal/transverse colon; polypectomy was performed using snare cautery/The colon was redundant/The colon mucosa was otherwise normal/Normal mucosa in the terminal ileum  . COLONOSCOPY N/A 02/21/2013   Procedure: COLONOSCOPY;  Surgeon: Danie Binder, MD;  [facility];  Service: Endoscopy;  Laterality: N/A;  10:30 AM  . FOOT SURGERY Right    heel spur  . SHOULDER ARTHROSCOPY WITH ROTATOR CUFF REPAIR Right 01/30/2016   Procedure: SHOULDER ARTHROSCOPY WITH ACROMIOPLASTY;  Surgeon: Carole Civil, MD;  Location: [facility];  Service: Orthopedics;  Laterality: Right;  pt knows to arrive at 7:15  . SHOULDER OPEN ROTATOR CUFF REPAIR Right 01/30/2016   Procedure: ROTATOR CUFF REPAIR SHOULDER OPEN;  Surgeon: Carole Civil, MD;  Location: [facility];  Service: Orthopedics;  Laterality:  Right;    There were no vitals filed for this visit.      Subjective Assessment - 04/24/16 1033    Subjective  S: I went to the MD, He is really pleased with my progress.   Currently in Pain? No/denies   Pain Score 0-No pain            [facility] OT Assessment - 04/24/16 0001      Assessment   Diagnosis S/P Right RCR, SAD, DCE      Precautions   Precautions Shoulder   Shoulder Interventions --  sling dc'ed   Precaution Comments P/ROM 2/22-3/30 then progress to AA/ROM, A/ROM, and strengthening as tolerated                   OT Treatments/Exercises (OP) - 04/24/16 0001      Exercises   Exercises Shoulder     Shoulder Exercises: Supine   Protraction PROM;AAROM;10 reps   Horizontal ABduction PROM;AAROM;10 reps   External Rotation PROM;AAROM;10 reps   Internal Rotation PROM;AAROM;10 reps   Flexion PROM;AAROM;10 reps   ABduction PROM;AAROM;10 reps     Shoulder Exercises: Standing   Extension AROM;15 reps   Row AROM;15 reps   Shoulder Elevation AROM;15 reps     Shoulder Exercises: Pulleys   Flexion 2 minutes   ABduction 2 minutes     Shoulder Exercises: Therapy Ball   Flexion 10 reps  ABduction 10 reps   Right/Left 5 reps     Shoulder Exercises: ROM/Strengthening   Wall Wash 1 minute with mod facilitation from therapist     Manual Therapy   Manual Therapy Myofascial release   Manual therapy comments manual therapy completed seperately from all other interventions this date    Myofascial Release myofascial release and manual stretching to right upper arm, scapular, and shoulder region to decrease pain and restrictions and imrpove pain free mobility in right shoulder.                   OT Short Term Goals - 04/17/16 0947      OT SHORT TERM GOAL #1   Title Patient will be educated on HEP for imroved functional use of right shoulder.    Time 6   Period Weeks   Status On-going     OT SHORT TERM GOAL #2   Title Patient will improve right  shoulder and elbow P/ROM to WNL for improved ease donning and doffing clothing.    Time 6   Period Weeks   Status On-going     OT SHORT TERM GOAL #3   Title Patient will improve right shoulder strength to 3+/5 for improved ability to complete activities at waist height.    Time 6   Period Weeks   Status On-going     OT SHORT TERM GOAL #4   Title Patient will decrease pain in her right shoudler and elbow to 4/10 during functional use.   Time 6   Period Weeks   Status Achieved     OT SHORT TERM GOAL #5   Title Patietn will decrease fascial restrictions to moderate for improved functional use of right arm.    Time 6   Period Weeks   Status Achieved           OT Long Term Goals - 04/17/16 0948      OT LONG TERM GOAL #1   Title Patient will return to prior level of independence with all B/IADLS and leisure activities, using right arm as dominant.    Time 12   Period Weeks   Status On-going     OT LONG TERM GOAL #2   Title Patient will improve right shoulder and elbow A/ROM to WNL in order to reach into overhead cabinets.   Time 12   Period Weeks   Status On-going     OT LONG TERM GOAL #3   Title Patient will improve right shoulder and elbow strength to 5/5 for improved ability to lift bags of groceries and baskets of laundry.   Time 12   Period Weeks   Status On-going     OT LONG TERM GOAL #4   Title Patient will decrease pain in her right shoulder and elbow to 2/10 or better during functional use.    Time 12   Period Weeks   Status On-going     OT LONG TERM GOAL #5   Title Patient will have trace fascial restrictions in her right shoulder and elbow region for greater functional use of right arm.   Time 12   Period Weeks   Status On-going               Plan - 04/24/16 1039    Clinical Impression Statement A:  patient sling is dc, per protocol, patient able to begin AA/ROm and progress as tolerated this date.    Plan P:  continue to improve P/ROM and  AA/ROM to [facility].  Complete AA/ROM and issue as HEP if patient is comfortable completing.    Consulted and Agree with Plan of Care Patient      Patient will benefit from skilled therapeutic intervention in order to improve the following deficits and impairments:  Decreased range of motion, Decreased skin integrity, Decreased scar mobility, Decreased strength, Increased edema, Increased fascial restricitons, Increased muscle spasms, Pain  Visit Diagnosis: Acute pain of right shoulder  Stiffness of right shoulder, not elsewhere classified  Other symptoms and signs involving the musculoskeletal system    Problem List Patient Active Problem List   Diagnosis Date Noted  . Complete tear of right rotator cuff   . Subacromial impingement of right shoulder   . Muscle weakness (generalized) 05/17/2013  . Rotator cuff syndrome 05/17/2013  . Right shoulder pain 03/28/2013  . Varicose veins of lower extremities with other complications 75/91/6384  . Heartburn 07/21/2011  . Ankle pain, chronic 06/02/2011  . HTN (hypertension) 05/20/2011  . OA (osteoarthritis) 05/20/2011  . Varicose veins 05/20/2011  . Obesity (BMI 30-39.9) 05/20/2011    Vangie Bicker, , OTR/L [phone]  04/24/2016, 11:00 AM  [place], [place], [address] Phone: [phone]   Fax:  [phone]  Name: MARTINA BRODBECK MRN: [id number] Date of Birth: May 08, 1955

## 2016-04-27 ENCOUNTER — Ambulatory Visit (HOSPITAL_COMMUNITY): Payer: BLUE CROSS/BLUE SHIELD | Attending: Orthopedic Surgery | Admitting: Specialist

## 2016-04-27 DIAGNOSIS — R29898 Other symptoms and signs involving the musculoskeletal system: Secondary | ICD-10-CM | POA: Insufficient documentation

## 2016-04-27 DIAGNOSIS — M25511 Pain in right shoulder: Secondary | ICD-10-CM | POA: Insufficient documentation

## 2016-04-27 DIAGNOSIS — M25611 Stiffness of right shoulder, not elsewhere classified: Secondary | ICD-10-CM | POA: Insufficient documentation

## 2016-04-27 NOTE — Therapy (Signed)
[name], [place], [address] Phone: [phone]   Fax:  [phone]  Occupational Therapy Treatment  Patient Details  Name: Betty Dawson MRN: [id number] Date of Birth: 1955-08-24 Referring Provider: Dr. Arther Abbott  Encounter Date: 04/27/2016      OT End of Session - 04/27/16 0941    Visit Number 9   Number of Visits 24   Date for OT Re-Evaluation 05/19/16   Authorization Type BCBS   Authorization Time Period 30 visits   Authorization - Visit Number 9   Authorization - Number of Visits 30   OT Start Time [phone]   OT Stop Time 0945   OT Time Calculation (min) 41 min   Activity Tolerance Patient tolerated treatment well   Behavior During Therapy [facility] for tasks assessed/performed      Past Medical History:  Diagnosis Date  . Anemia   . Arthritis    OA multiple joints  . Heart murmur   . Heel spur   . Hypertension   . Varicose veins     Past Surgical History:  Procedure Laterality Date  . ABDOMINAL HYSTERECTOMY     endometriosis  . COLONOSCOPY  02/21/2013   RDE:YCXKGY polyp measuring 6 mm in size was found in the distal/transverse colon; polypectomy was performed using snare cautery/The colon was redundant/The colon mucosa was otherwise normal/Normal mucosa in the terminal ileum  . COLONOSCOPY N/A 02/21/2013   Procedure: COLONOSCOPY;  Surgeon: Danie Binder, MD;  [facility];  Service: Endoscopy;  Laterality: N/A;  10:30 AM  . FOOT SURGERY Right    heel spur  . SHOULDER ARTHROSCOPY WITH ROTATOR CUFF REPAIR Right 01/30/2016   Procedure: SHOULDER ARTHROSCOPY WITH ACROMIOPLASTY;  Surgeon: Carole Civil, MD;  Location: [facility];  Service: Orthopedics;  Laterality: Right;  pt knows to arrive at 7:15  . SHOULDER OPEN ROTATOR CUFF REPAIR Right 01/30/2016   Procedure: ROTATOR CUFF REPAIR SHOULDER OPEN;  Surgeon: Carole Civil, MD;  Location: [facility];  Service: Orthopedics;  Laterality:  Right;    There were no vitals filed for this visit.      Subjective Assessment - 04/27/16 0940    Subjective  S;  My arm was a little sore after our last visit.  I started driving and its hard to turn the wheel.   Currently in Pain? Yes   Pain Score 2    Pain Location Shoulder   Pain Orientation Right   Pain Descriptors / Indicators Aching   Pain Type Acute pain   Pain Radiating Towards anterior shoulder   Pain Onset More than a month ago   Pain Frequency Intermittent   Aggravating Factors  movement overhead   Pain Relieving Factors rest            [facility] OT Assessment - 04/27/16 0001      Assessment   Diagnosis S/P Right RCR, SAD, DCE      Precautions   Precautions Shoulder   Precaution Comments P/ROM 2/22-3/30 then progress to AA/ROM, A/ROM, and strengthening as tolerated                   OT Treatments/Exercises (OP) - 04/27/16 0001      Exercises   Exercises Shoulder     Shoulder Exercises: Supine   Protraction PROM;AAROM;10 reps   Horizontal ABduction PROM;AAROM;10 reps   External Rotation PROM;AAROM;10 reps   Internal Rotation PROM;AAROM;10 reps   Flexion PROM;AAROM;10 reps  ABduction PROM;AAROM;10 reps   Other Supine Exercises serratus anterior punch 15 times with therapist unweighting arm      Shoulder Exercises: Standing   External Rotation Theraband;10 reps   Theraband Level (Shoulder External Rotation) Level 2 (Red)   Internal Rotation Theraband;10 reps   Theraband Level (Shoulder Internal Rotation) Level 2 (Red)   Extension Theraband;10 reps   Theraband Level (Shoulder Extension) Level 1 (Yellow)   Row Theraband;10 reps   Theraband Level (Shoulder Row) Level 2 (Red)   Retraction Theraband;10 reps   Theraband Level (Shoulder Retraction) Level 2 (Red)     Shoulder Exercises: Pulleys   Flexion 2 minutes   ABduction 2 minutes     Shoulder Exercises: Therapy Ball   Flexion 10 reps   ABduction 10 reps   Right/Left 5 reps      Shoulder Exercises: ROM/Strengthening   Rebounder             Wall Wash 1 minute with mod facilitation from therapist   Thumb Tacks 1' with mod facilitation to maintain position   Prot/Ret//Elev/Dep 1' with mod facilitation for unweighting arm     Manual Therapy   Manual Therapy Myofascial release   Manual therapy comments manual therapy completed seperately from all other interventions this date    Myofascial Release myofascial release and manual stretching to right upper arm, scapular, and shoulder region to decrease pain and restrictions and imrpove pain free mobility in right shoulder.                 OT Education - 04/27/16 0941    Education provided Yes   Education Details aa/rom in supine   Person(s) Educated Patient   Methods Explanation;Demonstration;Handout   Comprehension Verbalized understanding;Returned demonstration          OT Short Term Goals - 04/27/16 0947      OT SHORT TERM GOAL #1   Title Patient will be educated on HEP for imroved functional use of right shoulder.    Time 6   Period Weeks   Status On-going     OT SHORT TERM GOAL #2   Title Patient will improve right shoulder and elbow P/ROM to WNL for improved ease donning and doffing clothing.    Time 6   Period Weeks   Status On-going     OT SHORT TERM GOAL #3   Title Patient will improve right shoulder strength to 3+/5 for improved ability to complete activities at waist height.    Time 6   Period Weeks   Status On-going     OT SHORT TERM GOAL #4   Title Patient will decrease pain in her right shoudler and elbow to 4/10 during functional use.   Time 6   Period Weeks   Status Achieved     OT SHORT TERM GOAL #5   Title Patietn will decrease fascial restrictions to moderate for improved functional use of right arm.    Time 6   Period Weeks   Status Achieved           OT Long Term Goals - 04/27/16 0947      OT LONG TERM GOAL #1   Title Patient will return to prior level of  independence with all B/IADLS and leisure activities, using right arm as dominant.    Time 12   Period Weeks   Status On-going     OT LONG TERM GOAL #2   Title Patient will improve right shoulder and elbow A/ROM to  WNL in order to reach into overhead cabinets.   Time 12   Period Weeks   Status On-going     OT LONG TERM GOAL #3   Title Patient will improve right shoulder and elbow strength to 5/5 for improved ability to lift bags of groceries and baskets of laundry.   Time 12   Period Weeks   Status On-going     OT LONG TERM GOAL #4   Title Patient will decrease pain in her right shoulder and elbow to 2/10 or better during functional use.    Time 12   Period Weeks   Status On-going     OT LONG TERM GOAL #5   Title Patient will have trace fascial restrictions in her right shoulder and elbow region for greater functional use of right arm.   Time 12   Period Weeks   Status On-going               Plan - 04/27/16 7371    Clinical Impression Statement A:  patient demonstrating weakness with seated exercises, difficulty lifting arm to shoulder height independently.   Plan P:  Follow up on HEP, continue to build proximal shoulder strength needed to lift weight of arm during functional activities.    OT Home Exercise Plan 03/20/16:  towel slides, pendulum, A/ROM elbow-hand; 04/10/16:  isometric shoulder strengthening, 04/27/16:  aa/rom in supine      Patient will benefit from skilled therapeutic intervention in order to improve the following deficits and impairments:  Decreased range of motion, Decreased skin integrity, Decreased scar mobility, Decreased strength, Increased edema, Increased fascial restricitons, Increased muscle spasms, Pain  Visit Diagnosis: Acute pain of right shoulder  Stiffness of right shoulder, not elsewhere classified  Other symptoms and signs involving the musculoskeletal system    Problem List Patient Active Problem List   Diagnosis Date Noted  .  Complete tear of right rotator cuff   . Subacromial impingement of right shoulder   . Muscle weakness (generalized) 05/17/2013  . Rotator cuff syndrome 05/17/2013  . Right shoulder pain 03/28/2013  . Varicose veins of lower extremities with other complications 07/22/9483  . Heartburn 07/21/2011  . Ankle pain, chronic 06/02/2011  . HTN (hypertension) 05/20/2011  . OA (osteoarthritis) 05/20/2011  . Varicose veins 05/20/2011  . Obesity (BMI 30-39.9) 05/20/2011    Vangie Bicker, [place], OTR/L [phone]  04/27/2016, 9:48 AM  [place] [address] [place], [place], [address] Phone: [phone]   Fax:  [phone]  Name: TOMICKA LOVER MRN: [id number] Date of Birth: [birth year]

## 2016-04-27 NOTE — Patient Instructions (Signed)
Perform each exercise ____10____ reps. 2-3x days.   Protraction - STANDING  Start by holding a wand or cane at chest height.  Next, slowly push the wand outwards in front of your body so that your elbows become fully straightened. Then, return to the original position.     Shoulder FLEXION - STANDING - PALMS UP  In the standing position, hold a wand/cane with both arms, palms up on both sides. Raise up the wand/cane allowing your unaffected arm to perform most of the effort. Your affected arm should be partially relaxed.      Internal/External ROTATION - STANDING  In the standing position, hold a wand/cane with both hands keeping your elbows bent. Move your arms and wand/cane to one side.  Your affected arm should be partially relaxed while your unaffected arm performs most of the effort.       Shoulder ABDUCTION - STANDING  While holding a wand/cane palm face up on the injured side and palm face down on the uninjured side, slowly raise up your injured arm to the side.                     Horizontal Abduction/Adduction      Straight arms holding cane at shoulder height, bring cane to right, center, left. Repeat starting to left.   Copyright  VHI. All rights reserved.       

## 2016-05-01 ENCOUNTER — Encounter (HOSPITAL_COMMUNITY): Payer: Self-pay

## 2016-05-01 ENCOUNTER — Ambulatory Visit (HOSPITAL_COMMUNITY): Payer: BLUE CROSS/BLUE SHIELD

## 2016-05-01 DIAGNOSIS — M25511 Pain in right shoulder: Secondary | ICD-10-CM

## 2016-05-01 DIAGNOSIS — R29898 Other symptoms and signs involving the musculoskeletal system: Secondary | ICD-10-CM

## 2016-05-01 DIAGNOSIS — M25611 Stiffness of right shoulder, not elsewhere classified: Secondary | ICD-10-CM

## 2016-05-01 NOTE — Therapy (Signed)
[place], [place], [address] Phone: [phone]   Fax:  [phone]  Occupational Therapy Treatment  Patient Details  Name: Betty Dawson MRN: [id number] Date of Birth: 05/24/1955 Referring Provider: Dr. Arther Abbott  Encounter Date: 05/01/2016      OT End of Session - 05/01/16 0930    Visit Number 10   Number of Visits 24   Date for OT Re-Evaluation 05/19/16   Authorization Type BCBS   Authorization Time Period 30 visits   Authorization - Visit Number 10   Authorization - Number of Visits 30   OT Start Time 0902   OT Stop Time 0945   OT Time Calculation (min) 43 min   Activity Tolerance Patient tolerated treatment well   Behavior During Therapy [facility] for tasks assessed/performed      Past Medical History:  Diagnosis Date  . Anemia   . Arthritis    OA multiple joints  . Heart murmur   . Heel spur   . Hypertension   . Varicose veins     Past Surgical History:  Procedure Laterality Date  . ABDOMINAL HYSTERECTOMY     endometriosis  . COLONOSCOPY  02/21/2013   PXT:GGYIRS polyp measuring 6 mm in size was found in the distal/transverse colon; polypectomy was performed using snare cautery/The colon was redundant/The colon mucosa was otherwise normal/Normal mucosa in the terminal ileum  . COLONOSCOPY N/A 02/21/2013   Procedure: COLONOSCOPY;  Surgeon: Danie Binder, MD;  [facility];  Service: Endoscopy;  Laterality: N/A;  10:30 AM  . FOOT SURGERY Right    heel spur  . SHOULDER ARTHROSCOPY WITH ROTATOR CUFF REPAIR Right 01/30/2016   Procedure: SHOULDER ARTHROSCOPY WITH ACROMIOPLASTY;  Surgeon: Carole Civil, MD;  Location: [facility];  Service: Orthopedics;  Laterality: Right;  pt knows to arrive at 7:15  . SHOULDER OPEN ROTATOR CUFF REPAIR Right 01/30/2016   Procedure: ROTATOR CUFF REPAIR SHOULDER OPEN;  Surgeon: Carole Civil, MD;  Location: [facility];  Service: Orthopedics;  Laterality:  Right;    There were no vitals filed for this visit.      Subjective Assessment - 05/01/16 0921    Subjective  S: I'm arm feels pretty good.   Currently in Pain? No/denies                      OT Treatments/Exercises (OP) - 05/01/16 8546      Exercises   Exercises Shoulder     Shoulder Exercises: Supine   Protraction PROM;5 reps;AAROM;12 reps   Horizontal ABduction PROM;5 reps;AAROM;12 reps   External Rotation PROM;5 reps;AAROM;12 reps   Internal Rotation PROM;5 reps;AAROM;12 reps   Flexion PROM;5 reps;AAROM;12 reps   ABduction PROM;5 reps;AAROM;12 reps   Other Supine Exercises serratus anterior punch 15 times with therapist unweighting arm      Shoulder Exercises: Standing   Protraction AAROM;10 reps   Horizontal ABduction --  unable to perform   External Rotation AAROM;10 reps   Internal Rotation AAROM;10 reps   Flexion AAROM;10 reps   ABduction AAROM;10 reps   Extension Theraband;10 reps   Theraband Level (Shoulder Extension) Level 2 (Red)   Row Theraband;10 reps   Theraband Level (Shoulder Row) Level 2 (Red)     Shoulder Exercises: Therapy Ball   Right/Left 5 reps     Shoulder Exercises: ROM/Strengthening   Wall Wash 1 minute with mod facilitation from therapist   Thumb Tacks 1'  with mod facilitation to maintain position   Prot/Ret//Elev/Dep 1'     Manual Therapy   Manual Therapy Myofascial release   Manual therapy comments manual therapy completed seperately from all other interventions this date    Myofascial Release myofascial release and manual stretching to right upper arm, scapular, and shoulder region to decrease pain and restrictions and imrpove pain free mobility in right shoulder.                   OT Short Term Goals - 04/27/16 0947      OT SHORT TERM GOAL #1   Title Patient will be educated on HEP for imroved functional use of right shoulder.    Time 6   Period Weeks   Status On-going     OT SHORT TERM GOAL #2    Title Patient will improve right shoulder and elbow P/ROM to WNL for improved ease donning and doffing clothing.    Time 6   Period Weeks   Status On-going     OT SHORT TERM GOAL #3   Title Patient will improve right shoulder strength to 3+/5 for improved ability to complete activities at waist height.    Time 6   Period Weeks   Status On-going     OT SHORT TERM GOAL #4   Title Patient will decrease pain in her right shoudler and elbow to 4/10 during functional use.   Time 6   Period Weeks   Status Achieved     OT SHORT TERM GOAL #5   Title Patietn will decrease fascial restrictions to moderate for improved functional use of right arm.    Time 6   Period Weeks   Status Achieved           OT Long Term Goals - 04/27/16 0947      OT LONG TERM GOAL #1   Title Patient will return to prior level of independence with all B/IADLS and leisure activities, using right arm as dominant.    Time 12   Period Weeks   Status On-going     OT LONG TERM GOAL #2   Title Patient will improve right shoulder and elbow A/ROM to WNL in order to reach into overhead cabinets.   Time 12   Period Weeks   Status On-going     OT LONG TERM GOAL #3   Title Patient will improve right shoulder and elbow strength to 5/5 for improved ability to lift bags of groceries and baskets of laundry.   Time 12   Period Weeks   Status On-going     OT LONG TERM GOAL #4   Title Patient will decrease pain in her right shoulder and elbow to 2/10 or better during functional use.    Time 12   Period Weeks   Status On-going     OT LONG TERM GOAL #5   Title Patient will have trace fascial restrictions in her right shoulder and elbow region for greater functional use of right arm.   Time 12   Period Weeks   Status On-going               Plan - 05/01/16 0939    Clinical Impression Statement A: Continues to show weakness when required to raise arm to shoulder level. Trapezius muscles attempts to compensate  for weakness. VC to depress.    Plan P; Continue to work on increasing proximal shoulder strength and subscapularis in order to lift arm for functional  reaching.       Patient will benefit from skilled therapeutic intervention in order to improve the following deficits and impairments:  Decreased range of motion, Decreased skin integrity, Decreased scar mobility, Decreased strength, Increased edema, Increased fascial restricitons, Increased muscle spasms, Pain  Visit Diagnosis: Acute pain of right shoulder  Stiffness of right shoulder, not elsewhere classified  Other symptoms and signs involving the musculoskeletal system    Problem List Patient Active Problem List   Diagnosis Date Noted  . Complete tear of right rotator cuff   . Subacromial impingement of right shoulder   . Muscle weakness (generalized) 05/17/2013  . Rotator cuff syndrome 05/17/2013  . Right shoulder pain 03/28/2013  . Varicose veins of lower extremities with other complications 40/45/9136  . Heartburn 07/21/2011  . Ankle pain, chronic 06/02/2011  . HTN (hypertension) 05/20/2011  . OA (osteoarthritis) 05/20/2011  . Varicose veins 05/20/2011  . Obesity (BMI 30-39.9) 05/20/2011   Ailene Ravel, OTR/L,CBIS  [phone]  05/01/2016, 9:46 AM  [place] [address] [place], [place], [address] Phone: [phone]   Fax:  [phone]  Name: Betty Dawson MRN: [id number] Date of Birth: 1955/02/28

## 2016-05-11 ENCOUNTER — Ambulatory Visit (HOSPITAL_COMMUNITY): Payer: BLUE CROSS/BLUE SHIELD

## 2016-05-11 DIAGNOSIS — R29898 Other symptoms and signs involving the musculoskeletal system: Secondary | ICD-10-CM

## 2016-05-11 DIAGNOSIS — M25511 Pain in right shoulder: Secondary | ICD-10-CM

## 2016-05-11 DIAGNOSIS — M25611 Stiffness of right shoulder, not elsewhere classified: Secondary | ICD-10-CM

## 2016-05-11 NOTE — Therapy (Signed)
[place], [place], [address] Phone: [phone]   Fax:  [phone]  Occupational Therapy Treatment  Patient Details  Name: Betty Dawson MRN: [id number] Date of Birth: 10/10/1955 Referring Provider: Dr. Arther Abbott  Encounter Date: 05/11/2016      OT End of Session - 05/11/16 1141    Visit Number 11   Number of Visits 24   Date for OT Re-Evaluation 05/19/16   Authorization Type BCBS   Authorization Time Period 30 visits   Authorization - Visit Number 11   Authorization - Number of Visits 30   OT Start Time 1033   OT Stop Time 1115   OT Time Calculation (min) 42 min   Activity Tolerance Patient tolerated treatment well   Behavior During Therapy [facility] for tasks assessed/performed      Past Medical History:  Diagnosis Date  . Anemia   . Arthritis    OA multiple joints  . Heart murmur   . Heel spur   . Hypertension   . Varicose veins     Past Surgical History:  Procedure Laterality Date  . ABDOMINAL HYSTERECTOMY     endometriosis  . COLONOSCOPY  02/21/2013   VQQ:[id number] polyp measuring 6 mm in size was found in the distal/transverse colon; polypectomy was performed using snare cautery/The colon was redundant/The colon mucosa was otherwise normal/Normal mucosa in the terminal ileum  . COLONOSCOPY N/A 02/21/2013   Procedure: COLONOSCOPY;  Surgeon: Danie Binder, MD;  [facility]: [facility];  Service: Endoscopy;  Laterality: N/A;  10:30 AM  . FOOT SURGERY Right    heel spur  . SHOULDER ARTHROSCOPY WITH ROTATOR CUFF REPAIR Right 01/30/2016   Procedure: SHOULDER ARTHROSCOPY WITH ACROMIOPLASTY;  Surgeon: Carole Civil, MD;  Location: [facility] ORS;  Service: Orthopedics;  Laterality: Right;  pt knows to arrive at 7:15  . SHOULDER OPEN ROTATOR CUFF REPAIR Right 01/30/2016   Procedure: ROTATOR CUFF REPAIR SHOULDER OPEN;  Surgeon: Carole Civil, MD;  Location: [facility] OR[facility];  Service: Orthopedics;  Laterality:  Right;    There were no vitals filed for this visit.                    OT Treatments/Exercises (OP) - 05/11/16 1057      Exercises   Exercises Shoulder     Shoulder Exercises: Supine   Protraction PROM;5 reps;AAROM;15 reps   Horizontal ABduction PROM;5 reps;AAROM;15 reps   External Rotation PROM;5 reps;AAROM;15 reps   Internal Rotation PROM;5 reps;AAROM;15 reps   Flexion PROM;5 reps;AAROM;15 reps   ABduction PROM;5 reps;AAROM;15 reps     Shoulder Exercises: Standing   Protraction AAROM;10 reps   External Rotation AAROM;10 reps   Internal Rotation AAROM;10 reps   Flexion AAROM;10 reps   ABduction AAROM;10 reps     Shoulder Exercises: Pulleys   Flexion 2 minutes   ABduction 2 minutes     Shoulder Exercises: ROM/Strengthening   Wall Wash 1 minute with min facilitation from therapist   Thumb Tacks 1' max facilitation to mainton position   Proximal Shoulder Strengthening, Supine 12X no rest break                  OT Short Term Goals - 05/11/16 1145      OT SHORT TERM GOAL #1   Title Patient will be educated on HEP for imroved functional use of right shoulder.    Time 6   Period Weeks   Status  On-going     OT SHORT TERM GOAL #2   Title Patient will improve right shoulder and elbow P/ROM to WNL for improved ease donning and doffing clothing.    Time 6   Period Weeks   Status On-going     OT SHORT TERM GOAL #3   Title Patient will improve right shoulder strength to 3+/5 for improved ability to complete activities at waist height.    Time 6   Period Weeks   Status On-going     OT SHORT TERM GOAL #4   Title Patient will decrease pain in her right shoudler and elbow to 4/10 during functional use.   Time 6   Period Weeks     OT SHORT TERM GOAL #5   Title Patietn will decrease fascial restrictions to moderate for improved functional use of right arm.    Time 6   Period Weeks           OT Long Term Goals - 04/27/16 0947      OT  LONG TERM GOAL #1   Title Patient will return to prior level of independence with all B/IADLS and leisure activities, using right arm as dominant.    Time 12   Period Weeks   Status On-going     OT LONG TERM GOAL #2   Title Patient will improve right shoulder and elbow A/ROM to WNL in order to reach into overhead cabinets.   Time 12   Period Weeks   Status On-going     OT LONG TERM GOAL #3   Title Patient will improve right shoulder and elbow strength to 5/5 for improved ability to lift bags of groceries and baskets of laundry.   Time 12   Period Weeks   Status On-going     OT LONG TERM GOAL #4   Title Patient will decrease pain in her right shoulder and elbow to 2/10 or better during functional use.    Time 12   Period Weeks   Status On-going     OT LONG TERM GOAL #5   Title Patient will have trace fascial restrictions in her right shoulder and elbow region for greater functional use of right arm.   Time 12   Period Weeks   Status On-going               Plan - 05/11/16 1142    Clinical Impression Statement A: Increased repetitions with AA/ROM supine. Patient continues to have decreased strength with shoulder stability. Added proximal shoulder strengthening with VC for form and technique.   Plan P: Continue to work on increasing proximal shoulder and shoulder stability. Continue with therapy ball circles.      Patient will benefit from skilled therapeutic intervention in order to improve the following deficits and impairments:  Decreased range of motion, Decreased skin integrity, Decreased scar mobility, Decreased strength, Increased edema, Increased fascial restricitons, Increased muscle spasms, Pain  Visit Diagnosis: Acute pain of right shoulder  Stiffness of right shoulder, not elsewhere classified  Other symptoms and signs involving the musculoskeletal system    Problem List Patient Active Problem List   Diagnosis Date Noted  . Complete tear of right  rotator cuff   . Subacromial impingement of right shoulder   . Muscle weakness (generalized) 05/17/2013  . Rotator cuff syndrome 05/17/2013  . Right shoulder pain 03/28/2013  . Varicose veins of lower extremities with other complications 07/26/1599  . Heartburn 07/21/2011  . Ankle pain, chronic 06/02/2011  .  HTN (hypertension) 05/20/2011  . OA (osteoarthritis) 05/20/2011  . Varicose veins 05/20/2011  . Obesity (BMI 30-39.9) 05/20/2011   Ailene Ravel, OTR/L,CBIS  [phone]  05/11/2016, 11:49 AM  [place] [address] [place], [place], [address] Phone: [phone]   Fax:  [phone]  Name: Betty Dawson MRN: [id number] Date of Birth: 1955-09-02

## 2016-05-15 ENCOUNTER — Encounter (HOSPITAL_COMMUNITY): Payer: Self-pay

## 2016-05-15 ENCOUNTER — Ambulatory Visit (HOSPITAL_COMMUNITY): Payer: BLUE CROSS/BLUE SHIELD

## 2016-05-15 DIAGNOSIS — M25611 Stiffness of right shoulder, not elsewhere classified: Secondary | ICD-10-CM

## 2016-05-15 DIAGNOSIS — R29898 Other symptoms and signs involving the musculoskeletal system: Secondary | ICD-10-CM

## 2016-05-15 DIAGNOSIS — M25511 Pain in right shoulder: Secondary | ICD-10-CM | POA: Diagnosis not present

## 2016-05-15 NOTE — Therapy (Signed)
[place], [place], [address] Phone: [phone]   Fax:  [phone]  Occupational Therapy Treatment  Patient Details  Name: Betty Dawson MRN: [id number] Date of Birth: 1955-09-09 Referring Provider: Dr. Arther Abbott  Encounter Date: 05/15/2016      OT End of Session - 05/15/16 1110    Visit Number 12   Number of Visits 24   Date for OT Re-Evaluation 05/19/16   Authorization Type BCBS   Authorization Time Period 30 visits   Authorization - Visit Number 12   Authorization - Number of Visits 30   OT Start Time 0950   OT Stop Time 1030   OT Time Calculation (min) 40 min   Activity Tolerance Patient tolerated treatment well   Behavior During Therapy [facility] for tasks assessed/performed      Past Medical History:  Diagnosis Date  . Anemia   . Arthritis    OA multiple joints  . Heart murmur   . Heel spur   . Hypertension   . Varicose veins     Past Surgical History:  Procedure Laterality Date  . ABDOMINAL HYSTERECTOMY     endometriosis  . COLONOSCOPY  02/21/2013   [id number] polyp measuring 6 mm in size was found in the distal/transverse colon; polypectomy was performed using snare cautery/The colon was redundant/The colon mucosa was otherwise normal/Normal mucosa in the terminal ileum  . COLONOSCOPY N/A 02/21/2013   Procedure: COLONOSCOPY;  Surgeon: Danie Binder, MD;  [facility];  Service: Endoscopy;  Laterality: N/A;  10:30 AM  . FOOT SURGERY Right    heel spur  . SHOULDER ARTHROSCOPY WITH ROTATOR CUFF REPAIR Right 01/30/2016   Procedure: SHOULDER ARTHROSCOPY WITH ACROMIOPLASTY;  Surgeon: Carole Civil, MD;  Location: [facility];  Service: Orthopedics;  Laterality: Right;  pt knows to arrive at 7:15  . SHOULDER OPEN ROTATOR CUFF REPAIR Right 01/30/2016   Procedure: ROTATOR CUFF REPAIR SHOULDER OPEN;  Surgeon: Carole Civil, MD;  Location: [facility];  Service: Orthopedics;  Laterality:  Right;    There were no vitals filed for this visit.      Subjective Assessment - 05/15/16 1109    Subjective  S: I do my exercises with my granddaughter.   Currently in Pain? No/denies            [facility] OT Assessment - 05/15/16 1004      Assessment   Diagnosis S/P Right RCR, SAD, DCE      Precautions   Precautions Shoulder   Precaution Comments P/ROM 2/22-3/30 then progress to AA/ROM, A/ROM, and strengthening as tolerated                   OT Treatments/Exercises (OP) - 05/15/16 1004      Exercises   Exercises Shoulder     Shoulder Exercises: Supine   Protraction PROM;5 reps;AROM;10 reps  2 sets   Horizontal ABduction PROM;5 reps;AROM;10 reps   External Rotation PROM;5 reps;AROM;10 reps   Internal Rotation PROM;5 reps;AROM;10 reps   Flexion PROM;5 reps;AROM;10 reps   ABduction PROM;5 reps;AROM;10 reps     Shoulder Exercises: Standing   Protraction AAROM;12 reps   Horizontal ABduction --  unable to perform   External Rotation AAROM;12 reps   Internal Rotation AAROM;12 reps   Flexion AAROM;12 reps   ABduction AAROM;12 reps     Shoulder Exercises: ROM/Strengthening   UBE (Upper Arm Bike) Level 1 2' forward 2' reverse  Proximal Shoulder Strengthening, Supine 12X no rest break     Manual Therapy   Manual Therapy Myofascial release   Manual therapy comments manual therapy completed seperately from all other interventions this date                 OT Education - 05/15/16 1110    Education provided Yes   Education Details A/ROM shoulder exercises; complete supine only for now.   Person(s) Educated Patient   Methods Explanation;Demonstration;Verbal cues;Handout   Comprehension Returned demonstration;Verbalized understanding          OT Short Term Goals - 05/11/16 1145      OT SHORT TERM GOAL #1   Title Patient will be educated on HEP for imroved functional use of right shoulder.    Time 6   Period Weeks   Status On-going     OT  SHORT TERM GOAL #2   Title Patient will improve right shoulder and elbow P/ROM to WNL for improved ease donning and doffing clothing.    Time 6   Period Weeks   Status On-going     OT SHORT TERM GOAL #3   Title Patient will improve right shoulder strength to 3+/5 for improved ability to complete activities at waist height.    Time 6   Period Weeks   Status On-going     OT SHORT TERM GOAL #4   Title Patient will decrease pain in her right shoudler and elbow to 4/10 during functional use.   Time 6   Period Weeks     OT SHORT TERM GOAL #5   Title Patietn will decrease fascial restrictions to moderate for improved functional use of right arm.    Time 6   Period Weeks           OT Long Term Goals - 04/27/16 0947      OT LONG TERM GOAL #1   Title Patient will return to prior level of independence with all B/IADLS and leisure activities, using right arm as dominant.    Time 12   Period Weeks   Status On-going     OT LONG TERM GOAL #2   Title Patient will improve right shoulder and elbow A/ROM to WNL in order to reach into overhead cabinets.   Time 12   Period Weeks   Status On-going     OT LONG TERM GOAL #3   Title Patient will improve right shoulder and elbow strength to 5/5 for improved ability to lift bags of groceries and baskets of laundry.   Time 12   Period Weeks   Status On-going     OT LONG TERM GOAL #4   Title Patient will decrease pain in her right shoulder and elbow to 2/10 or better during functional use.    Time 12   Period Weeks   Status On-going     OT LONG TERM GOAL #5   Title Patient will have trace fascial restrictions in her right shoulder and elbow region for greater functional use of right arm.   Time 12   Period Weeks   Status On-going               Plan - 05/15/16 1111    Clinical Impression Statement A: Patient was able to progress to A/ROM supine this session. VC for form and technique. Continues to be unable to complete  horizontal abduction seated during AA/ROM.   Plan P: Progress to A/ROM seated as able to tolerate. Continue  with therapy ball circles.       Patient will benefit from skilled therapeutic intervention in order to improve the following deficits and impairments:  Decreased range of motion, Decreased skin integrity, Decreased scar mobility, Decreased strength, Increased edema, Increased fascial restricitons, Increased muscle spasms, Pain  Visit Diagnosis: Acute pain of right shoulder  Stiffness of right shoulder, not elsewhere classified  Other symptoms and signs involving the musculoskeletal system    Problem List Patient Active Problem List   Diagnosis Date Noted  . Complete tear of right rotator cuff   . Subacromial impingement of right shoulder   . Muscle weakness (generalized) 05/17/2013  . Rotator cuff syndrome 05/17/2013  . Right shoulder pain 03/28/2013  . Varicose veins of lower extremities with other complications 40/37/5436  . Heartburn 07/21/2011  . Ankle pain, chronic 06/02/2011  . HTN (hypertension) 05/20/2011  . OA (osteoarthritis) 05/20/2011  . Varicose veins 05/20/2011  . Obesity (BMI 30-39.9) 05/20/2011   Ailene Ravel, OTR/L,CBIS  [phone]  05/15/2016, 11:17 AM  [place] [address] [place], [place], [address] Phone: [phone]   Fax:  [phone]  Name: Betty Dawson MRN: [id number] Date of Birth: Jan 16, 1956

## 2016-05-15 NOTE — Patient Instructions (Signed)
1) Shoulder Protraction    Begin with elbows by your side, slowly "punch" straight out in front of you.      2) Shoulder Flexion  Supine:     Standing:         Begin with arms at your side with thumbs pointed up, slowly raise both arms up and forward towards overhead.    3) Horizontal abduction/adduction  Supine:   Standing:           Begin with arms straight out in front of you, bring out to the side in at "T" shape. Keep arms straight entire time.    4) Internal & External Rotation    *No band* -Stand with elbows at the side and elbows bent 90 degrees. Move your forearms away from your body, then bring back inward toward the body.     5) Shoulder Abduction  Supine:     Standing:       Lying on your back begin with your arms flat on the table next to your side. Slowly move your arms out to the side so that they go overhead, in a jumping jack or snow angel movement.       Repeat all exercises 10-15 times, 1-2 times per day.  

## 2016-05-22 ENCOUNTER — Encounter (HOSPITAL_COMMUNITY): Payer: Self-pay

## 2016-05-22 ENCOUNTER — Ambulatory Visit (HOSPITAL_COMMUNITY): Payer: BLUE CROSS/BLUE SHIELD

## 2016-05-22 DIAGNOSIS — M25511 Pain in right shoulder: Secondary | ICD-10-CM

## 2016-05-22 DIAGNOSIS — R29898 Other symptoms and signs involving the musculoskeletal system: Secondary | ICD-10-CM

## 2016-05-22 DIAGNOSIS — M25611 Stiffness of right shoulder, not elsewhere classified: Secondary | ICD-10-CM

## 2016-05-22 NOTE — Patient Instructions (Signed)

## 2016-05-22 NOTE — Therapy (Signed)
[place], [place], [address] Phone: [phone]   Fax:  [phone]  Occupational Therapy Treatment And recertification  Patient Details  Name: Betty Dawson MRN: [id number] Date of Birth: 13-Oct-1955 Referring Provider: Dr. Arther Abbott  Encounter Date: 05/22/2016      OT End of Session - 05/22/16 1142    Visit Number 13   Number of Visits 24   Date for OT Re-Evaluation 06/21/16   Authorization Type BC[employer]   Authorization Time Period 30 visits   Authorization - Visit Number 13   Authorization - Number of Visits 30   OT Start Time 0950  reassess   OT Stop Time 1030   OT Time Calculation (min) 40 min   Activity Tolerance Patient tolerated treatment well   Behavior During Therapy [facility] for tasks assessed/performed      Past Medical History:  Diagnosis Date  . Anemia   . Arthritis    OA multiple joints  . Heart murmur   . Heel spur   . Hypertension   . Varicose veins     Past Surgical History:  Procedure Laterality Date  . ABDOMINAL HYSTERECTOMY     endometriosis  . COLONOSCOPY  02/21/2013   [id number] polyp measuring 6 mm in size was found in the distal/transverse colon; polypectomy was performed using snare cautery/The colon was redundant/The colon mucosa was otherwise normal/Normal mucosa in the terminal ileum  . COLONOSCOPY N/A 02/21/2013   Procedure: COLONOSCOPY;  Surgeon: Danie Binder, MD;  [facility];  Service: Endoscopy;  Laterality: N/A;  10:30 AM  . FOOT SURGERY Right    heel spur  . SHOULDER ARTHROSCOPY WITH ROTATOR CUFF REPAIR Right 01/30/2016   Procedure: SHOULDER ARTHROSCOPY WITH ACROMIOPLASTY;  Surgeon: Carole Civil, MD;  Location: [facility];  Service: Orthopedics;  Laterality: Right;  pt knows to arrive at 7:15  . SHOULDER OPEN ROTATOR CUFF REPAIR Right 01/30/2016   Procedure: ROTATOR CUFF REPAIR SHOULDER OPEN;  Surgeon: Carole Civil, MD;  Location: [facility];   Service: Orthopedics;  Laterality: Right;    There were no vitals filed for this visit.      Subjective Assessment - 05/22/16 1010    Subjective  S: I just have a hard time raising it up.   Currently in Pain? No/denies            [facility] OT Assessment - 05/22/16 1000      Assessment   Diagnosis S/P Right RCR, SAD, DCE      Precautions   Precautions Shoulder   Precaution Comments P/ROM 2/22-3/30 then progress to AA/ROM, A/ROM, and strengthening as tolerated      Palpation   Palpation comment Min fascial restrictions     AROM   Overall AROM Comments Assessed standing. IR/er adducted. A/ROM not assessed prior to this date.    AROM Assessment Site Shoulder   Right/Left Shoulder Right   Right Shoulder Flexion 55 Degrees   Right Shoulder ABduction 57 Degrees   Right Shoulder Internal Rotation 90 Degrees   Right Shoulder External Rotation 35 Degrees     PROM   Overall PROM Comments assessed in supine, External and internal rotation with shoudler adducted    PROM Assessment Site Shoulder   Right/Left Shoulder Right   Right Shoulder Flexion 130 Degrees  previous: 145   Right Shoulder ABduction 128 Degrees  previous: 100   Right Shoulder Internal Rotation 90 Degrees  previous: same  Right Shoulder External Rotation 55 Degrees  previous: 38     Strength   Overall Strength Comments Assessed standing. IR/ er adducted. Strength not assessed previously.   Strength Assessment Site Shoulder   Right/Left Shoulder Right   Right Shoulder Flexion 3-/5   Right Shoulder ABduction 3-/5   Right Shoulder Internal Rotation 3/5   Right Shoulder External Rotation 3/5                  OT Treatments/Exercises (OP) - 05/22/16 1009      Exercises   Exercises Shoulder     Shoulder Exercises: Standing   Protraction AAROM;12 reps   Horizontal ABduction AAROM;10 reps  unable to hold bar at shoulder level   External Rotation AAROM;12 reps   Internal Rotation AAROM;12 reps    Flexion AAROM;12 reps   ABduction AAROM;12 reps   Extension Theraband;12 reps   Theraband Level (Shoulder Extension) Level 2 (Red)   Row Theraband;12 reps   Theraband Level (Shoulder Row) Level 2 (Red)   Retraction Theraband;12 reps   Theraband Level (Shoulder Retraction) Level 2 (Red)     Shoulder Exercises: Therapy Ball   Right/Left 5 reps     Shoulder Exercises: ROM/Strengthening   Wall Wash 2'+     Manual Therapy   Manual Therapy Myofascial release   Manual therapy comments manual therapy completed seperately from all other interventions this date    Myofascial Release myofascial release and manual stretching to right upper arm, scapular, and shoulder region to decrease pain and restrictions and imrpove pain free mobility in right shoulder.                 OT Education - 05/22/16 1141    Education provided Yes   Education Details red theraband scapular strengthening exercises. Discussed completing the Wall wash at home and focusing on keeping the elbow in when completing shoulder flexion versus abducting it for greater results.    Person(s) Educated Patient   Methods Explanation;Demonstration;Verbal cues;Tactile cues;Handout   Comprehension Returned demonstration;Verbalized understanding          OT Short Term Goals - 05/22/16 1200      OT SHORT TERM GOAL #1   Title Patient will be educated on HEP for imroved functional use of right shoulder.    Time 6   Period Weeks   Status On-going     OT SHORT TERM GOAL #2   Title Patient will improve right shoulder and elbow P/ROM to WNL for improved ease donning and doffing clothing.    Baseline 4/27: P/ROM is functional versus within normal ranges   Time 6   Period Weeks   Status On-going     OT SHORT TERM GOAL #3   Title Patient will improve right shoulder strength to 3+/5 for improved ability to complete activities at waist height.    Time 6   Period Weeks   Status On-going     OT SHORT TERM GOAL #4   Title  Patient will decrease pain in her right shoudler and elbow to 4/10 during functional use.   Time 6   Period Weeks     OT SHORT TERM GOAL #5   Title Patietn will decrease fascial restrictions to moderate for improved functional use of right arm.    Time 6   Period Weeks           OT Long Term Goals - 05/22/16 1201      OT LONG TERM GOAL #1  Title Patient will return to prior level of independence with all B/IADLS and leisure activities, using right arm as dominant.    Time 12   Period Weeks   Status On-going     OT LONG TERM GOAL #2   Title Patient will improve right shoulder and elbow A/ROM to WNL in order to reach into overhead cabinets.   Time 12   Period Weeks   Status On-going     OT LONG TERM GOAL #3   Title Patient will improve right shoulder and elbow strength to 5/5 for improved ability to lift bags of groceries and baskets of laundry.   Time 12   Period Weeks   Status On-going     OT LONG TERM GOAL #4   Title Patient will decrease pain in her right shoulder and elbow to 2/10 or better during functional use.    Time 12   Period Weeks   Status Achieved     OT LONG TERM GOAL #5   Title Patient will have trace fascial restrictions in her right shoulder and elbow region for greater functional use of right arm.   Time 12   Period Weeks   Status On-going               Plan - 05/22/16 1142    Clinical Impression Statement A: Re-cert completed this date with measurements and goals reviewed. Patient has not met any additional goals this date. Overall, patient has met 2/5 short term goals and 1 long term goal. patient continues to have deficits in ROM and strength which limits her ability to complete any task at shoulder level. Patient is making slow progress overall. A/ROM and strength was measured for the first time today. Unable to complete A/ROM exercises as planned due to lack of ROM. Patient did better during wall wash and did not require any facilitation  from therapist with VC to keep shoulder adducted with elbow in when completing shoulder flexion. Patient reports she would like to be discharged at next session even though therapist has recommended to continue therapy. Pt reports that she will continue therapy independently at home.    Plan P: Patient wishes for next session to be her discharge date. Hold therapy until she see Dr. Aline Brochure. Update HEP in the mean time.       Patient will benefit from skilled therapeutic intervention in order to improve the following deficits and impairments:  Decreased range of motion, Decreased skin integrity, Decreased scar mobility, Decreased strength, Increased edema, Increased fascial restricitons, Increased muscle spasms, Pain  Visit Diagnosis: Acute pain of right shoulder - Plan: Ot plan of care cert/re-cert  Stiffness of right shoulder, not elsewhere classified - Plan: Ot plan of care cert/re-cert  Other symptoms and signs involving the musculoskeletal system - Plan: Ot plan of care cert/re-cert    Problem List Patient Active Problem List   Diagnosis Date Noted  . Complete tear of right rotator cuff   . Subacromial impingement of right shoulder   . Muscle weakness (generalized) 05/17/2013  . Rotator cuff syndrome 05/17/2013  . Right shoulder pain 03/28/2013  . Varicose veins of lower extremities with other complications 38/88/2800  . Heartburn 07/21/2011  . Ankle pain, chronic 06/02/2011  . HTN (hypertension) 05/20/2011  . OA (osteoarthritis) 05/20/2011  . Varicose veins 05/20/2011  . Obesity (BMI 30-39.9) 05/20/2011   Ailene Ravel, OTR/L,CBIS  [phone]  05/22/2016, 2:49 PM  [place],  [place], [address] Phone: [phone]   Fax:  [phone]  Name: Betty Dawson MRN: [id number] Date of Birth: 02/28/1955

## 2016-05-25 ENCOUNTER — Encounter (HOSPITAL_COMMUNITY): Payer: Self-pay

## 2016-05-25 ENCOUNTER — Ambulatory Visit (HOSPITAL_COMMUNITY): Payer: BLUE CROSS/BLUE SHIELD

## 2016-05-25 DIAGNOSIS — M25511 Pain in right shoulder: Secondary | ICD-10-CM

## 2016-05-25 DIAGNOSIS — R29898 Other symptoms and signs involving the musculoskeletal system: Secondary | ICD-10-CM

## 2016-05-25 DIAGNOSIS — M25611 Stiffness of right shoulder, not elsewhere classified: Secondary | ICD-10-CM

## 2016-05-25 NOTE — Therapy (Addendum)
[place], [place], [address] Phone: [phone]   Fax:  [phone]  Occupational Therapy Treatment  Patient Details  Name: Betty Dawson MRN: [id number] Date of Birth: 01-01-1956 Referring Provider: Dr. Arther Abbott  Encounter Date: 05/25/2016      OT End of Session - 05/25/16 1056    Visit Number 14   Number of Visits 24   Date for OT Re-Evaluation 06/21/16   Authorization Type [employer]   Authorization Time Period 30 visits   Authorization - Visit Number 72   Authorization - Number of Visits 30   OT Start Time 0945   OT Stop Time 1032   OT Time Calculation (min) 47 min   Activity Tolerance Patient tolerated treatment well   Behavior During Therapy [facility] for tasks assessed/performed      Past Medical History:  Diagnosis Date  . Anemia   . Arthritis    OA multiple joints  . Heart murmur   . Heel spur   . Hypertension   . Varicose veins     Past Surgical History:  Procedure Laterality Date  . ABDOMINAL HYSTERECTOMY     endometriosis  . COLONOSCOPY  02/21/2013   THY:HOOILN polyp measuring 6 mm in size was found in the distal/transverse colon; polypectomy was performed using snare cautery/The colon was redundant/The colon mucosa was otherwise normal/Normal mucosa in the terminal ileum  . COLONOSCOPY N/A 02/21/2013   Procedure: COLONOSCOPY;  Surgeon: Danie Binder, MD;  Location: [facility] ENDO SUITE;  Service: Endoscopy;  Laterality: N/A;  10:30 AM  . FOOT SURGERY Right    heel spur  . SHOULDER ARTHROSCOPY WITH ROTATOR CUFF REPAIR Right 01/30/2016   Procedure: SHOULDER ARTHROSCOPY WITH ACROMIOPLASTY;  Surgeon: Carole Civil, MD;  Location: [facility] ORS;  Service: Orthopedics;  Laterality: Right;  pt knows to arrive at 7:15  . SHOULDER OPEN ROTATOR CUFF REPAIR Right 01/30/2016   Procedure: ROTATOR CUFF REPAIR SHOULDER OPEN;  Surgeon: Carole Civil, MD;  Location: [facility] ORS;  Service: Orthopedics;  Laterality:  Right;    There were no vitals filed for this visit.      Subjective Assessment - 05/25/16 1001    Subjective  S: No pain today. I still can't lift it very well.   Currently in Pain? No/denies            [facility] OT Assessment - 05/25/16 1003      Assessment   Diagnosis S/P Right RCR, SAD, DCE      Precautions   Precautions Shoulder   Precaution Comments P/ROM 2/22-3/30 then progress to AA/ROM, A/ROM, and strengthening as tolerated                   OT Treatments/Exercises (OP) - 05/25/16 1003      Exercises   Exercises Shoulder     Shoulder Exercises: Supine   Protraction PROM;5 reps;AROM;12 reps   Horizontal ABduction PROM;5 reps;AROM;12 reps   External Rotation PROM;5 reps;AROM;12 reps   Internal Rotation PROM;5 reps;AROM;12 reps   Flexion PROM;5 reps;AROM;12 reps   ABduction PROM;5 reps;AROM;12 reps     Shoulder Exercises: Standing   Protraction AAROM;12 reps   Horizontal ABduction AAROM;10 reps  unable to hold bar at shoulder level   External Rotation AAROM;12 reps   Internal Rotation AAROM;12 reps   Flexion AAROM;12 reps   ABduction AAROM;12 reps   Extension Theraband;12 reps   Theraband Level (Shoulder Extension) Level 2 (Red)  Row Theraband;12 reps   Theraband Level (Shoulder Row) Level 2 (Red)   Retraction Theraband;12 reps   Theraband Level (Shoulder Retraction) Level 2 (Red)     Shoulder Exercises: ROM/Strengthening   Wall Wash 2'     Shoulder Exercises: Stretch   Corner Stretch 2 reps;10 seconds   External Rotation Stretch 2 reps;10 seconds   Wall Stretch - Flexion 2 reps;10 seconds     Manual Therapy   Manual Therapy Myofascial release   Manual therapy comments manual therapy completed seperately from all other interventions this date    Myofascial Release myofascial release and manual stretching to right upper arm, scapular, and shoulder region to decrease pain and restrictions and imrpove pain free mobility in right shoulder.                    OT Short Term Goals - 05/22/16 1200      OT SHORT TERM GOAL #1   Title Patient will be educated on HEP for imroved functional use of right shoulder.    Time 6   Period Weeks   Status On-going     OT SHORT TERM GOAL #2   Title Patient will improve right shoulder and elbow P/ROM to WNL for improved ease donning and doffing clothing.    Baseline 4/27: P/ROM is functional versus within normal ranges   Time 6   Period Weeks   Status On-going     OT SHORT TERM GOAL #3   Title Patient will improve right shoulder strength to 3+/5 for improved ability to complete activities at waist height.    Time 6   Period Weeks   Status On-going     OT SHORT TERM GOAL #4   Title Patient will decrease pain in her right shoudler and elbow to 4/10 during functional use.   Time 6   Period Weeks     OT SHORT TERM GOAL #5   Title Patietn will decrease fascial restrictions to moderate for improved functional use of right arm.    Time 6   Period Weeks           OT Long Term Goals - 05/22/16 1201      OT LONG TERM GOAL #1   Title Patient will return to prior level of independence with all B/IADLS and leisure activities, using right arm as dominant.    Time 12   Period Weeks   Status On-going     OT LONG TERM GOAL #2   Title Patient will improve right shoulder and elbow A/ROM to WNL in order to reach into overhead cabinets.   Time 12   Period Weeks   Status On-going     OT LONG TERM GOAL #3   Title Patient will improve right shoulder and elbow strength to 5/5 for improved ability to lift bags of groceries and baskets of laundry.   Time 12   Period Weeks   Status On-going     OT LONG TERM GOAL #4   Title Patient will decrease pain in her right shoulder and elbow to 2/10 or better during functional use.    Time 12   Period Weeks   Status Achieved     OT LONG TERM GOAL #5   Title Patient will have trace fascial restrictions in her right shoulder and elbow  region for greater functional use of right arm.   Time 12   Period Weeks   Status On-going  Plan - 05/25/16 1057    Clinical Impression Statement A: HEP was updated to include shoulder stretches for external rotation, flexion, and doorway pectoralis stretch. Patient continues to request today at her last session of therapy. Reviewed HEP that has been established for proper carry over. Pt see Dr. Aline Brochure in the next few weeks.    Plan P: Patient will be placed on hold until she sees Dr. Aline Brochure for follow up appointment. Continued therapy was recommended although patient is requesting discharge.       Patient will benefit from skilled therapeutic intervention in order to improve the following deficits and impairments:  Decreased range of motion, Decreased skin integrity, Decreased scar mobility, Decreased strength, Increased edema, Increased fascial restricitons, Increased muscle spasms, Pain  Visit Diagnosis: Acute pain of right shoulder  Stiffness of right shoulder, not elsewhere classified  Other symptoms and signs involving the musculoskeletal system    Problem List Patient Active Problem List   Diagnosis Date Noted  . Complete tear of right rotator cuff   . Subacromial impingement of right shoulder   . Muscle weakness (generalized) 05/17/2013  . Rotator cuff syndrome 05/17/2013  . Right shoulder pain 03/28/2013  . Varicose veins of lower extremities with other complications 03/47/4259  . Heartburn 07/21/2011  . Ankle pain, chronic 06/02/2011  . HTN (hypertension) 05/20/2011  . OA (osteoarthritis) 05/20/2011  . Varicose veins 05/20/2011  . Obesity (BMI 30-39.9) 05/20/2011   Ailene Ravel, OTR/L,CBIS  [phone]  05/25/2016, 11:01 AM  [facility] [address] [place], [place], [address] Phone: [phone]   Fax:  [phone]  Name: Betty Dawson MRN: [id number] Date of Birth: 1955/10/23     OCCUPATIONAL THERAPY DISCHARGE SUMMARY  Visits from Start of Care: 14  Current functional level related to goals / functional outcomes: 2/5 STGs met and 1 long term goal met.   Remaining deficits: Patient continues to make improvement although she presents with deficits in strength, ROM, and fascial restrictions.    Education / Equipment: Pt was encouraged to continue therapy although she is requesting D/C. Patient had a follow up appointment with MD who recommended she continue. Patient has not returned to clinic. Pt has a HEP established.  Plan: Patient agrees to discharge.  Patient goals were partially met. Patient is being discharged due to not returning since the last visit.  ?????         Ailene Ravel, OTR/L,CBIS  [phone]

## 2016-05-25 NOTE — Patient Instructions (Signed)
EXTERNAL ROTATION STRETCH  Place arm along edge of door as pictured.  Keep elbow and shoulder at 90 degree angles.  Gently lean the upper torso forward until a stretch is felt in the shoulder. Hold _10-30__ seconds; relax; repeat 2 times    Doorway Stretch  Place each hand opposite each other on the doorway. (You can change where you feel the stretch by moving arms higher or lower.) Step through with one foot and bend front knee until a stretch is felt and hold. Step through with the opposite foot on the next rep. Hold for __10-30___ seconds. Repeat _2___times.      Wall Flexion  Using a towel, slide your arm up the wall until a stretch is felt in your shoulder . Hold for 10-30 seconds. Repeat 2 times.

## 2016-06-01 ENCOUNTER — Ambulatory Visit (INDEPENDENT_AMBULATORY_CARE_PROVIDER_SITE_OTHER): Payer: BLUE CROSS/BLUE SHIELD | Admitting: Orthopedic Surgery

## 2016-06-01 ENCOUNTER — Encounter: Payer: Self-pay | Admitting: Orthopedic Surgery

## 2016-06-01 DIAGNOSIS — Z9889 Other specified postprocedural states: Secondary | ICD-10-CM

## 2016-06-01 NOTE — Progress Notes (Signed)
Patient ID: Betty Dawson, female   DOB: 1955-10-26, 61 y.o.   MRN: [id number]  Chief Complaint  Patient presents with  . Follow-up    right rotator cuff repair, DOS 01/30/16    HPI She is 61 years old she is 4 months out from a right rotator cuff repair she's doing well without complaints of pain ROS    There were no vitals taken for this visit.  Ortho Exam Her active rotation was 35 on the right 45 on the left her active abduction was 75 on the right and her flexion passively was 150  A/P  Medical decision-making  Encounter Diagnosis  Name Primary?  . Status post right rotator cuff repair Yes    Recommend continued physical therapy work on strengthening she is doing very well and I'll see her in 2 months  Arther Abbott, MD 06/01/2016 9:01 AM

## 2016-06-18 ENCOUNTER — Other Ambulatory Visit: Payer: Self-pay | Admitting: Family Medicine

## 2016-07-03 ENCOUNTER — Other Ambulatory Visit: Payer: Self-pay | Admitting: Family Medicine

## 2016-07-31 ENCOUNTER — Other Ambulatory Visit: Payer: Self-pay | Admitting: Family Medicine

## 2016-07-31 ENCOUNTER — Ambulatory Visit: Payer: BLUE CROSS/BLUE SHIELD | Admitting: Orthopedic Surgery

## 2016-07-31 DIAGNOSIS — R921 Mammographic calcification found on diagnostic imaging of breast: Secondary | ICD-10-CM

## 2016-08-07 ENCOUNTER — Ambulatory Visit
Admission: RE | Admit: 2016-08-07 | Discharge: 2016-08-07 | Disposition: A | Discharge status: Home / Self Care | Payer: BLUE CROSS/BLUE SHIELD | Source: Ambulatory Visit | Attending: Family Medicine | Admitting: Family Medicine

## 2016-08-07 DIAGNOSIS — R921 Mammographic calcification found on diagnostic imaging of breast: Secondary | ICD-10-CM

## 2016-08-18 ENCOUNTER — Ambulatory Visit: Payer: BLUE CROSS/BLUE SHIELD | Admitting: Orthopedic Surgery

## 2016-10-30 ENCOUNTER — Ambulatory Visit (INDEPENDENT_AMBULATORY_CARE_PROVIDER_SITE_OTHER): Payer: BLUE CROSS/BLUE SHIELD | Admitting: Family Medicine

## 2016-10-30 ENCOUNTER — Encounter: Payer: Self-pay | Admitting: Family Medicine

## 2016-10-30 VITALS — BP 118/68 | HR 74 | Temp 99.2°F | Resp 16 | Ht 66.0 in | Wt 187.0 lb

## 2016-10-30 DIAGNOSIS — Z23 Encounter for immunization: Secondary | ICD-10-CM

## 2016-10-30 DIAGNOSIS — M159 Polyosteoarthritis, unspecified: Secondary | ICD-10-CM

## 2016-10-30 DIAGNOSIS — E669 Obesity, unspecified: Secondary | ICD-10-CM

## 2016-10-30 DIAGNOSIS — M150 Primary generalized (osteo)arthritis: Secondary | ICD-10-CM

## 2016-10-30 DIAGNOSIS — L309 Dermatitis, unspecified: Secondary | ICD-10-CM

## 2016-10-30 DIAGNOSIS — I10 Essential (primary) hypertension: Secondary | ICD-10-CM | POA: Diagnosis not present

## 2016-10-30 DIAGNOSIS — D508 Other iron deficiency anemias: Secondary | ICD-10-CM | POA: Diagnosis not present

## 2016-10-30 DIAGNOSIS — D509 Iron deficiency anemia, unspecified: Secondary | ICD-10-CM | POA: Insufficient documentation

## 2016-10-30 DIAGNOSIS — M8949 Other hypertrophic osteoarthropathy, multiple sites: Secondary | ICD-10-CM

## 2016-10-30 MED ORDER — HYDROCHLOROTHIAZIDE 25 MG PO TABS: 25.0000 mg | ORAL_TABLET | Freq: Every day | ORAL | 5 refills | Status: DC

## 2016-10-30 MED ORDER — AMLODIPINE BESYLATE 5 MG PO TABS: 5.0000 mg | ORAL_TABLET | Freq: Every day | ORAL | 6 refills | Status: DC

## 2016-10-30 MED ORDER — TRIAMCINOLONE ACETONIDE 0.1 % EX CREA: 1.0000 "application " | TOPICAL_CREAM | Freq: Two times a day (BID) | CUTANEOUS | 0 refills | Status: DC

## 2016-10-30 NOTE — Assessment & Plan Note (Signed)
Check CBC, she has not been taking her iron tablets

## 2016-10-30 NOTE — Progress Notes (Signed)
   Subjective:    Patient ID: Betty Dawson, female    DOB: 31-Dec-1955, 61 y.o.   MRN: [id number]  Patient presents for Medication Management (Pt fasting); Medication Refill; Flu Vaccine; and Rash on arms  Here to follow-up chronic medical problems last visit was in September 2017 Medications reviewed she is on amlodipine hydrochlorothiazide for hypertension. No concerns with her medication  She has osteoarthritis of multiple joints causing chronic pain. She is following with Dr. Aline Brochure orthopedics as she recently had rotator cuff repair she is no longer any pain medication  Rash on right arm near , used cortisone and has helped with the itching.  She is now retired Review Of Systems:  GEN- denies fatigue, fever, weight loss,weakness, recent illness HEENT- denies eye drainage, change in vision, nasal discharge, CVS- denies chest pain, palpitations RESP- denies SOB, cough, wheeze ABD- denies N/V, change in stools, abd pain GU- denies dysuria, hematuria, dribbling, incontinence MSK-+joint pain, muscle aches, injury Neuro- denies headache, dizziness, syncope, seizure activity       Objective:    BP 118/68   Pulse 74   Temp 99.2 F (37.3 C) (Oral)   Resp 16   Ht 5\' 6"  (1.676 m)   Wt 187 lb (84.8 kg)   SpO2 97%   BMI 30.18 kg/m  GEN- NAD, alert and oriented x3 HEENT- PERRL, EOMI, non injected sclera, pink conjunctiva, MMM, oropharynx clear Neck- Supple, no thyromegaly CVS- RRR, no murmur RESP-CTAB ABD-NABS,soft,NT,ND Skin- Right arm few scabs from previous excoriations, mild eczematous patch near flexural area of arm EXT- No edema,extensive varicose veins  Pulses- Radial, DP- 2+        Assessment & Plan:      Problem List Items Addressed This Visit      Unprioritized   Obesity (BMI 30-39.9)   OA (osteoarthritis)    Continue tylenol as needed      HTN (hypertension) - Primary    Controlled no changes to meds       Relevant Medications   amLODipine  (NORVASC) 5 MG tablet   hydrochlorothiazide (HYDRODIURIL) 25 MG tablet   Anemia, iron deficiency    Check CBC, she has not been taking her iron tablets       Other Visit Diagnoses    Eczema, unspecified type       Triamcinolone cream prescribed       Note: This dictation was prepared with Dragon dictation along with smaller phrase technology. Any transcriptional errors that result from this process are unintentional.

## 2016-10-30 NOTE — Patient Instructions (Signed)
F/U  6 months PHYSICAL  Flu shot done We will call with labs

## 2016-10-30 NOTE — Addendum Note (Signed)
Addended by: Shary Decamp B on: 10/30/2016 10:34 AM   Modules accepted: Orders

## 2016-10-30 NOTE — Assessment & Plan Note (Signed)
Continue tylenol as needed.

## 2016-10-30 NOTE — Assessment & Plan Note (Signed)
Controlled no changes to meds 

## 2016-11-03 LAB — COMPREHENSIVE METABOLIC PANEL
AG Ratio: 1.2 (calc) (ref 1.0–2.5)
ALKALINE PHOSPHATASE (APISO): 96 U/L (ref 33–130)
ALT: 9 U/L (ref 6–29)
AST: 14 U/L (ref 10–35)
Albumin: 3.9 g/dL (ref 3.6–5.1)
BUN: 14 mg/dL (ref 7–25)
CALCIUM: 9.2 mg/dL (ref 8.6–10.4)
CHLORIDE: 97 mmol/L — AB (ref 98–110)
CO2: 32 mmol/L (ref 20–32)
CREATININE: 0.59 mg/dL (ref 0.50–0.99)
Globulin: 3.3 g/dL (calc) (ref 1.9–3.7)
Glucose, Bld: 108 mg/dL — ABNORMAL HIGH (ref 65–99)
Potassium: 3.8 mmol/L (ref 3.5–5.3)
Sodium: 138 mmol/L (ref 135–146)
Total Bilirubin: 0.4 mg/dL (ref 0.2–1.2)
Total Protein: 7.2 g/dL (ref 6.1–8.1)

## 2016-11-03 LAB — CBC WITH DIFFERENTIAL/PLATELET
BASOS ABS: 23 {cells}/uL (ref 0–200)
Basophils Relative: 0.3 %
EOS ABS: 62 {cells}/uL (ref 15–500)
Eosinophils Relative: 0.8 %
HCT: 35.3 % (ref 35.0–45.0)
Hemoglobin: 11.6 g/dL — ABNORMAL LOW (ref 11.7–15.5)
Lymphs Abs: 1594 cells/uL (ref 850–3900)
MCH: 27.5 pg (ref 27.0–33.0)
MCHC: 32.9 g/dL (ref 32.0–36.0)
MCV: 83.6 fL (ref 80.0–100.0)
MPV: 11.7 fL (ref 7.5–12.5)
Monocytes Relative: 5.2 %
NEUTROS PCT: 73 %
Neutro Abs: 5621 cells/uL (ref 1500–7800)
PLATELETS: 281 10*3/uL (ref 140–400)
RBC: 4.22 10*6/uL (ref 3.80–5.10)
RDW: 12 % (ref 11.0–15.0)
TOTAL LYMPHOCYTE: 20.7 %
WBC: 7.7 10*3/uL (ref 3.8–10.8)
WBCMIX: 400 {cells}/uL (ref 200–950)

## 2016-11-03 LAB — TEST AUTHORIZATION

## 2016-11-03 LAB — IRON,TIBC AND FERRITIN PANEL
%SAT: 10 % — AB (ref 11–50)
FERRITIN: 108 ng/mL (ref 20–288)
Iron: 28 ug/dL — ABNORMAL LOW (ref 45–160)
TIBC: 279 mcg/dL (calc) (ref 250–450)

## 2016-11-03 LAB — HEMOGLOBIN A1C
EAG (MMOL/L): 6 (calc)
HEMOGLOBIN A1C: 5.4 %{Hb} (ref <5.7)
MEAN PLASMA GLUCOSE: 108 (calc)

## 2016-11-03 LAB — LIPID PANEL
CHOL/HDL RATIO: 1.8 (calc) (ref <5.0)
CHOLESTEROL: 148 mg/dL (ref <200)
HDL: 81 mg/dL (ref >50)
LDL CHOLESTEROL (CALC): 56 mg/dL
Non-HDL Cholesterol (Calc): 67 mg/dL (calc) (ref <130)
Triglycerides: 38 mg/dL (ref <150)

## 2016-12-21 IMAGING — DX DG TOE GREAT 2+V*R*
3 series · 3 of 3 positions shown · non-contrast
Comparison: None.

CLINICAL DATA: Dropped a pack a chicken on her great toe.

EXAM:
RIGHT GREAT TOE

[toe ap]
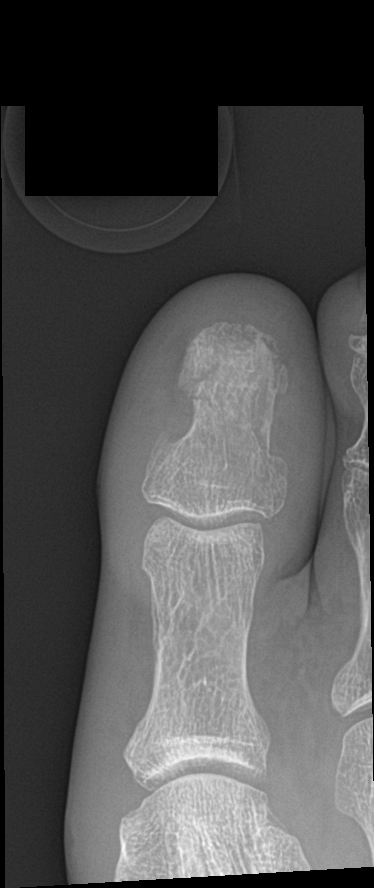

[toe obl]
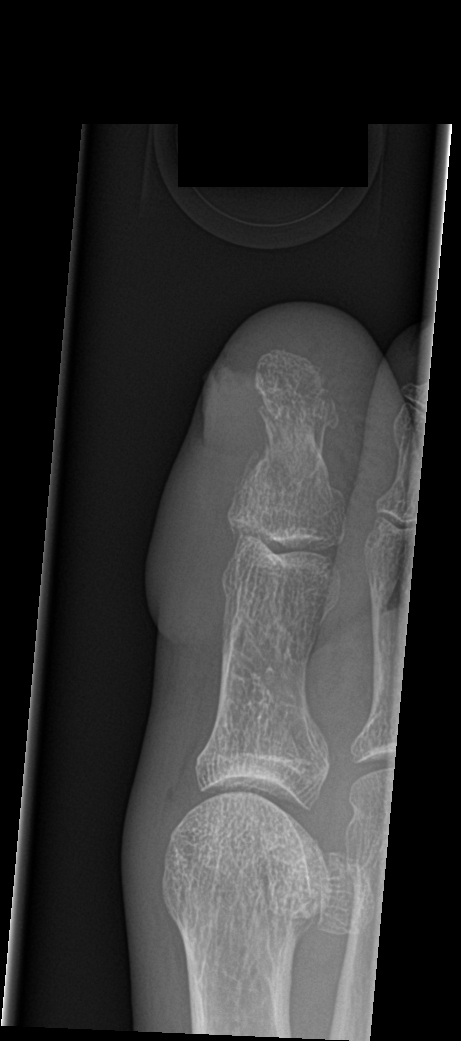

[toe lat]
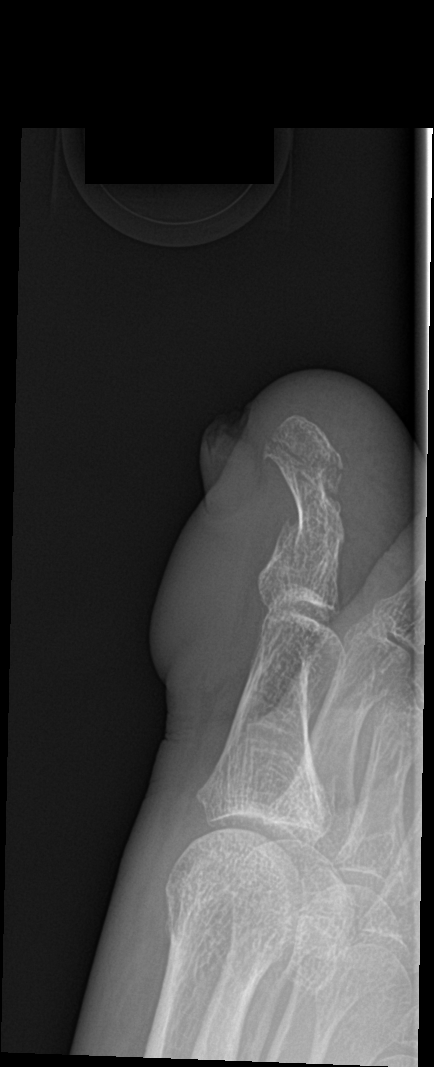

[3 of 3 positions shown; findings below may reference images not displayed]

FINDINGS: Nondisplaced comminuted fracture of the distal aspect of the first
distal phalanx involving the tuft. No other fracture or dislocation.
Soft tissue swelling around the first distal phalanx.
IMPRESSION: 1. Nondisplaced comminuted fracture of the distal aspect of the
first distal phalanx involving the tuft.

## 2016-12-25 ENCOUNTER — Other Ambulatory Visit: Payer: Self-pay | Admitting: Family Medicine

## 2016-12-25 DIAGNOSIS — R921 Mammographic calcification found on diagnostic imaging of breast: Secondary | ICD-10-CM

## 2017-01-22 ENCOUNTER — Ambulatory Visit
Admission: RE | Admit: 2017-01-22 | Discharge: 2017-01-22 | Disposition: A | Discharge status: Home / Self Care | Payer: BLUE CROSS/BLUE SHIELD | Source: Ambulatory Visit | Attending: Family Medicine | Admitting: Family Medicine

## 2017-01-22 DIAGNOSIS — R921 Mammographic calcification found on diagnostic imaging of breast: Secondary | ICD-10-CM

## 2017-05-03 ENCOUNTER — Encounter: Payer: Self-pay | Admitting: Family Medicine

## 2017-05-03 ENCOUNTER — Ambulatory Visit (INDEPENDENT_AMBULATORY_CARE_PROVIDER_SITE_OTHER): Payer: BLUE CROSS/BLUE SHIELD | Admitting: Family Medicine

## 2017-05-03 ENCOUNTER — Other Ambulatory Visit: Payer: Self-pay

## 2017-05-03 VITALS — BP 130/64 | HR 64 | Temp 97.9°F | Resp 14 | Ht 66.0 in | Wt 186.0 lb

## 2017-05-03 DIAGNOSIS — M21612 Bunion of left foot: Secondary | ICD-10-CM

## 2017-05-03 DIAGNOSIS — Z0000 Encounter for general adult medical examination without abnormal findings: Secondary | ICD-10-CM | POA: Diagnosis not present

## 2017-05-03 DIAGNOSIS — M2041 Other hammer toe(s) (acquired), right foot: Secondary | ICD-10-CM

## 2017-05-03 DIAGNOSIS — M21611 Bunion of right foot: Secondary | ICD-10-CM | POA: Diagnosis not present

## 2017-05-03 DIAGNOSIS — D508 Other iron deficiency anemias: Secondary | ICD-10-CM

## 2017-05-03 DIAGNOSIS — I10 Essential (primary) hypertension: Secondary | ICD-10-CM | POA: Diagnosis not present

## 2017-05-03 DIAGNOSIS — E669 Obesity, unspecified: Secondary | ICD-10-CM

## 2017-05-03 NOTE — Assessment & Plan Note (Signed)
Check iron levels

## 2017-05-03 NOTE — Assessment & Plan Note (Signed)
Well controlled no changes 

## 2017-05-03 NOTE — Patient Instructions (Addendum)
I recommend eye visit once a year I recommend dental visit every 6 months Goal is to  Exercise 30 minutes 5 days a week We will send a letter with lab results  Get calcium vitamin D  1200/Vitamin D  Referral to podiatry  F/U 6 months

## 2017-05-03 NOTE — Progress Notes (Signed)
   Subjective:    Patient ID: Betty Dawson, female    DOB: 1955-04-30, 62 y.o.   MRN: [id number]  Patient presents for CPE (is fasting)  Pt here for CPE Prevention-colonoscopy up-to-date, mammogram up-to-date Immunizations reviewed-- UTD Reviewed medications and history    Bilat foot pain, feels she is developing hammer toes on right foot, bilat bunion pain    Due for eye doctor appointment    Dentist- has teeth pulled on the top for dentures ,working on the bottom    Anemia- taking iron pills most days, until she gets constipated       Review Of Systems:  GEN- denies fatigue, fever, weight loss,weakness, recent illness HEENT- denies eye drainage, change in vision, nasal discharge, CVS- denies chest pain, palpitations RESP- denies SOB, cough, wheeze ABD- denies N/V, change in stools, abd pain GU- denies dysuria, hematuria, dribbling, incontinence MSK- denies joint pain, muscle aches, injury Neuro- denies headache, dizziness, syncope, seizure activity       Objective:    BP 130/64   Pulse 64   Temp 97.9 F (36.6 C) (Oral)   Resp 14   Ht 5\' 6"  (1.676 m)   Wt 186 lb (84.4 kg)   SpO2 99%   BMI 30.02 kg/m  GEN- NAD, alert and oriented x3 HEENT- PERRL, EOMI, non injected sclera, pink conjunctiva, MMM, oropharynx clear Neck- Supple, no thyromegaly CVS- RRR, no murmur RESP-CTAB ABD-NABS,soft,NT,ND EXT- No edema,varicose veins, bunions bilat L >R, mild hammer toes 2-3rd digits right foot, callus  Pulses- Radial, DP- 2+        Assessment & Plan:      Problem List Items Addressed This Visit      Unprioritized   Obesity (BMI 30-39.9)    Continues to work on diet, her goal is 170lbs which is reasonable      HTN (hypertension)    Well controlled no changes       Anemia, iron deficiency    Check iron levels       Relevant Orders   Iron, TIBC and Ferritin Panel    Other Visit Diagnoses    Routine general medical examination at a health care facility     -  Primary   CPE done, referral topodiatry, prevention UTD,recommend calcium/Vitamin D supplement    Relevant Orders   CBC with Differential/Platelet   Comprehensive metabolic panel   Bilateral bunions       Relevant Orders   Ambulatory referral to Podiatry   Hammer toe of right foot       Relevant Orders   Ambulatory referral to Podiatry      Note: This dictation was prepared with Dragon dictation along with smaller phrase technology. Any transcriptional errors that result from this process are unintentional.

## 2017-05-03 NOTE — Assessment & Plan Note (Signed)
Continues to work on diet, her goal is 170lbs which is reasonable

## 2017-05-04 LAB — CBC WITH DIFFERENTIAL/PLATELET
Basophils Absolute: 48 cells/uL (ref 0–200)
Basophils Relative: 1 %
EOS PCT: 5.8 %
Eosinophils Absolute: 278 cells/uL (ref 15–500)
HEMATOCRIT: 33.9 % — AB (ref 35.0–45.0)
HEMOGLOBIN: 11.1 g/dL — AB (ref 11.7–15.5)
LYMPHS ABS: 1546 {cells}/uL (ref 850–3900)
MCH: 27.3 pg (ref 27.0–33.0)
MCHC: 32.7 g/dL (ref 32.0–36.0)
MCV: 83.5 fL (ref 80.0–100.0)
MPV: 11.8 fL (ref 7.5–12.5)
Monocytes Relative: 7.9 %
NEUTROS ABS: 2549 {cells}/uL (ref 1500–7800)
Neutrophils Relative %: 53.1 %
Platelets: 283 10*3/uL (ref 140–400)
RBC: 4.06 10*6/uL (ref 3.80–5.10)
RDW: 12.2 % (ref 11.0–15.0)
Total Lymphocyte: 32.2 %
WBC mixed population: 379 cells/uL (ref 200–950)
WBC: 4.8 10*3/uL (ref 3.8–10.8)

## 2017-05-04 LAB — COMPREHENSIVE METABOLIC PANEL
AG RATIO: 1.4 (calc) (ref 1.0–2.5)
ALBUMIN MSPROF: 4.1 g/dL (ref 3.6–5.1)
ALT: 8 U/L (ref 6–29)
AST: 14 U/L (ref 10–35)
Alkaline phosphatase (APISO): 97 U/L (ref 33–130)
BILIRUBIN TOTAL: 0.4 mg/dL (ref 0.2–1.2)
BUN: 9 mg/dL (ref 7–25)
CALCIUM: 9.2 mg/dL (ref 8.6–10.4)
CO2: 31 mmol/L (ref 20–32)
Chloride: 103 mmol/L (ref 98–110)
Creat: 0.54 mg/dL (ref 0.50–0.99)
Globulin: 3 g/dL (calc) (ref 1.9–3.7)
Glucose, Bld: 97 mg/dL (ref 65–99)
POTASSIUM: 3.9 mmol/L (ref 3.5–5.3)
SODIUM: 141 mmol/L (ref 135–146)
TOTAL PROTEIN: 7.1 g/dL (ref 6.1–8.1)

## 2017-05-04 LAB — IRON,TIBC AND FERRITIN PANEL
%SAT: 28 % (calc) (ref 11–50)
FERRITIN: 130 ng/mL (ref 20–288)
Iron: 70 ug/dL (ref 45–160)
TIBC: 248 mcg/dL (calc) — ABNORMAL LOW (ref 250–450)

## 2017-05-07 ENCOUNTER — Other Ambulatory Visit: Payer: Self-pay | Admitting: *Deleted

## 2017-05-07 MED ORDER — FERROUS SULFATE DRIED ER 143 (45 FE) MG PO TBCR: 1.0000 | EXTENDED_RELEASE_TABLET | Freq: Every day | ORAL | 3 refills | Status: DC

## 2017-08-24 ENCOUNTER — Encounter: Payer: Self-pay | Admitting: Family Medicine

## 2017-10-18 ENCOUNTER — Other Ambulatory Visit: Payer: Self-pay | Admitting: Family Medicine

## 2017-11-02 ENCOUNTER — Ambulatory Visit: Payer: BLUE CROSS/BLUE SHIELD | Admitting: Family Medicine

## 2017-11-08 ENCOUNTER — Other Ambulatory Visit: Payer: Self-pay | Admitting: Family Medicine

## 2017-12-22 ENCOUNTER — Other Ambulatory Visit: Payer: Self-pay

## 2017-12-22 ENCOUNTER — Ambulatory Visit (INDEPENDENT_AMBULATORY_CARE_PROVIDER_SITE_OTHER): Payer: Medicare Other | Admitting: Family Medicine

## 2017-12-22 ENCOUNTER — Other Ambulatory Visit: Payer: Self-pay | Admitting: Family Medicine

## 2017-12-22 ENCOUNTER — Encounter: Payer: Self-pay | Admitting: Family Medicine

## 2017-12-22 VITALS — BP 122/64 | HR 58 | Temp 98.1°F | Resp 14 | Ht 66.0 in | Wt 173.0 lb

## 2017-12-22 DIAGNOSIS — D508 Other iron deficiency anemias: Secondary | ICD-10-CM | POA: Diagnosis not present

## 2017-12-22 DIAGNOSIS — M8949 Other hypertrophic osteoarthropathy, multiple sites: Secondary | ICD-10-CM

## 2017-12-22 DIAGNOSIS — I10 Essential (primary) hypertension: Secondary | ICD-10-CM | POA: Diagnosis not present

## 2017-12-22 DIAGNOSIS — Z23 Encounter for immunization: Secondary | ICD-10-CM | POA: Diagnosis not present

## 2017-12-22 DIAGNOSIS — M159 Polyosteoarthritis, unspecified: Secondary | ICD-10-CM

## 2017-12-22 DIAGNOSIS — M150 Primary generalized (osteo)arthritis: Secondary | ICD-10-CM

## 2017-12-22 MED ORDER — AMLODIPINE BESYLATE 5 MG PO TABS: 5.0000 mg | ORAL_TABLET | Freq: Every day | ORAL | 1 refills | Status: DC

## 2017-12-22 MED ORDER — AMLODIPINE BESYLATE 5 MG PO TABS: 5.0000 mg | ORAL_TABLET | Freq: Every day | ORAL | 2 refills | Status: DC

## 2017-12-22 NOTE — Patient Instructions (Signed)
F/U 6 MONTHS for physical  FLu shot given

## 2017-12-22 NOTE — Progress Notes (Signed)
   Subjective:    Patient ID: Betty Dawson, female    DOB: 1955-11-04, 62 y.o.   MRN: [id number]  Patient presents for Follow-up (is fasting)   Pt here to f/u chronic medical problems  She is taking meds as prescribed No new concerns   HTN- doing well on meds, no HA/SOB Obesity- intentionally trying to lose weight  Meds reviewed    Review Of Systems:  GEN- denies fatigue, fever, weight loss,weakness, recent illness HEENT- denies eye drainage, change in vision, nasal discharge, CVS- denies chest pain, palpitations RESP- denies SOB, cough, wheeze ABD- denies N/V, change in stools, abd pain GU- denies dysuria, hematuria, dribbling, incontinence MSK- + joint pain, muscle aches, injury Neuro- denies headache, dizziness, syncope, seizure activity       Objective:    BP 122/64   Pulse (!) 58   Temp 98.1 F (36.7 C) (Oral)   Resp 14   Ht 5\' 6"  (1.676 m)   Wt 173 lb (78.5 kg)   SpO2 100%   BMI 27.92 kg/m  GEN- NAD, alert and oriented x3 HEENT- PERRL, EOMI, non injected sclera, pink conjunctiva, MMM, oropharynx clear Neck- Supple, no thyromegaly CVS- RRR, no murmur RESP-CTAB EXT- No edema,varicose veins Pulses- Radial 2+, DP- diminished         Assessment & Plan:      Problem List Items Addressed This Visit      Unprioritized   Anemia, iron deficiency    Recheck Hb and iron stores      Relevant Orders   CBC with Differential/Platelet (Completed)   Iron (Completed)   HTN (hypertension)    Well controlled no changes      Relevant Medications   amLODipine (NORVASC) 5 MG tablet   Other Relevant Orders   CBC with Differential/Platelet (Completed)   Comprehensive metabolic panel (Completed)   Lipid panel (Completed)   OA (osteoarthritis) - Primary    Multiple joints on NSAIDS as needed Discussed staying active as possible Weight loss will also help pain       Other Visit Diagnoses    Need for immunization against influenza       Relevant Orders    Flu Vaccine QUAD 36+ mos IM (Completed)      Note: This dictation was prepared with Dragon dictation along with smaller phrase technology. Any transcriptional errors that result from this process are unintentional.

## 2017-12-22 NOTE — Addendum Note (Signed)
Addended by: Shary Decamp B on: 12/22/2017 11:17 AM   Modules accepted: Orders

## 2017-12-23 LAB — CBC WITH DIFFERENTIAL/PLATELET
BASOS ABS: 32 {cells}/uL (ref 0–200)
Basophils Relative: 0.6 %
EOS PCT: 1.5 %
Eosinophils Absolute: 81 cells/uL (ref 15–500)
HCT: 34.4 % — ABNORMAL LOW (ref 35.0–45.0)
HEMOGLOBIN: 11.3 g/dL — AB (ref 11.7–15.5)
Lymphs Abs: 1631 cells/uL (ref 850–3900)
MCH: 27.6 pg (ref 27.0–33.0)
MCHC: 32.8 g/dL (ref 32.0–36.0)
MCV: 84.1 fL (ref 80.0–100.0)
MONOS PCT: 8 %
MPV: 11.7 fL (ref 7.5–12.5)
NEUTROS ABS: 3224 {cells}/uL (ref 1500–7800)
Neutrophils Relative %: 59.7 %
Platelets: 274 10*3/uL (ref 140–400)
RBC: 4.09 10*6/uL (ref 3.80–5.10)
RDW: 11.9 % (ref 11.0–15.0)
Total Lymphocyte: 30.2 %
WBC mixed population: 432 cells/uL (ref 200–950)
WBC: 5.4 10*3/uL (ref 3.8–10.8)

## 2017-12-23 LAB — COMPREHENSIVE METABOLIC PANEL
AG RATIO: 1.2 (calc) (ref 1.0–2.5)
ALT: 7 U/L (ref 6–29)
AST: 14 U/L (ref 10–35)
Albumin: 3.8 g/dL (ref 3.6–5.1)
Alkaline phosphatase (APISO): 88 U/L (ref 33–130)
BUN: 14 mg/dL (ref 7–25)
CO2: 29 mmol/L (ref 20–32)
CREATININE: 0.59 mg/dL (ref 0.50–0.99)
Calcium: 9.1 mg/dL (ref 8.6–10.4)
Chloride: 103 mmol/L (ref 98–110)
GLOBULIN: 3.1 g/dL (ref 1.9–3.7)
Glucose, Bld: 79 mg/dL (ref 65–99)
POTASSIUM: 4 mmol/L (ref 3.5–5.3)
SODIUM: 142 mmol/L (ref 135–146)
Total Bilirubin: 0.3 mg/dL (ref 0.2–1.2)
Total Protein: 6.9 g/dL (ref 6.1–8.1)

## 2017-12-23 LAB — LIPID PANEL
CHOL/HDL RATIO: 2.2 (calc) (ref <5.0)
Cholesterol: 139 mg/dL (ref <200)
HDL: 62 mg/dL (ref >50)
LDL CHOLESTEROL (CALC): 65 mg/dL
Non-HDL Cholesterol (Calc): 77 mg/dL (calc) (ref <130)
TRIGLYCERIDES: 43 mg/dL (ref <150)

## 2017-12-23 LAB — IRON: IRON: 82 ug/dL (ref 45–160)

## 2017-12-23 NOTE — Assessment & Plan Note (Signed)
Well controlled no changes 

## 2017-12-23 NOTE — Assessment & Plan Note (Signed)
Multiple joints on NSAIDS as needed Discussed staying active as possible Weight loss will also help pain

## 2017-12-23 NOTE — Assessment & Plan Note (Signed)
Recheck Hb and iron stores

## 2017-12-29 ENCOUNTER — Encounter: Payer: Self-pay | Admitting: *Deleted

## 2018-02-02 ENCOUNTER — Other Ambulatory Visit: Payer: Self-pay

## 2018-02-02 ENCOUNTER — Ambulatory Visit (INDEPENDENT_AMBULATORY_CARE_PROVIDER_SITE_OTHER): Payer: Medicare Other | Admitting: Family Medicine

## 2018-02-02 ENCOUNTER — Encounter: Payer: Self-pay | Admitting: Family Medicine

## 2018-02-02 VITALS — BP 126/68 | HR 70 | Temp 97.9°F | Resp 14 | Ht 66.0 in | Wt 177.0 lb

## 2018-02-02 DIAGNOSIS — H938X2 Other specified disorders of left ear: Secondary | ICD-10-CM

## 2018-02-02 NOTE — Patient Instructions (Addendum)
Take allergy pill claritin or zyrtec for a few weeks see if that helps F/u as previous

## 2018-02-02 NOTE — Progress Notes (Signed)
   Subjective:    Patient ID: Betty Dawson, female    DOB: 1955/02/01, 63 y.o.   MRN: [id number]  Patient presents for Ear Pressure (L ear- feels like there is pressure or stopping up- no drainage or wax noted)   Ear pressure and buzzing sensation in left ear. It will pop and it clears up then it comes back, no drainage from the ear.   No fever. No sinus drainage  No change in hearing    Review Of Systems:  GEN- denies fatigue, fever, weight loss,weakness, recent illness HEENT- denies eye drainage, change in vision, nasal discharge, CVS- denies chest pain, palpitations RESP- denies SOB, cough, wheeze ABD- denies N/V, change in stools, abd pain GU- denies dysuria, hematuria, dribbling, incontinence MSK- denies joint pain, muscle aches, injury Neuro- denies headache, dizziness, syncope, seizure activity       Objective:    BP 126/68   Pulse 70   Temp 97.9 F (36.6 C) (Oral)   Resp 14   Ht 5\' 6"  (1.676 m)   Wt 177 lb (80.3 kg)   SpO2 100%   BMI 28.57 kg/m  GEN- NAD, alert and oriented x3 HEENT- PERRL, EOMI, non injected sclera, pink conjunctiva, MMM, oropharynx clear, TM clear bilat no effusion, canal clear, hearing in tact Neck- Supple, no thyromegaly CVS- RRR, no murmur RESP-CTAB    Hearing screen Passed     Assessment & Plan:      Problem List Items Addressed This Visit    None    Visit Diagnoses    Pressure sensation in left ear    -  Primary   Try anti-histamine for more of the pressure possible from allergies or sinus congestion, no decongestants due to blood pressure, if it does not improve ENT, hearing exam normal      Note: This dictation was prepared with Dragon dictation along with smaller phrase technology. Any transcriptional errors that result from this process are unintentional.

## 2018-02-03 DIAGNOSIS — H2511 Age-related nuclear cataract, right eye: Secondary | ICD-10-CM | POA: Diagnosis not present

## 2018-02-03 DIAGNOSIS — H524 Presbyopia: Secondary | ICD-10-CM | POA: Diagnosis not present

## 2018-02-03 DIAGNOSIS — H52222 Regular astigmatism, left eye: Secondary | ICD-10-CM | POA: Diagnosis not present

## 2018-02-03 DIAGNOSIS — H5213 Myopia, bilateral: Secondary | ICD-10-CM | POA: Diagnosis not present

## 2018-02-08 ENCOUNTER — Encounter: Payer: Self-pay | Admitting: Gastroenterology

## 2018-03-03 ENCOUNTER — Encounter: Payer: Self-pay | Admitting: Family Medicine

## 2018-06-24 ENCOUNTER — Encounter: Payer: Medicare Other | Admitting: Family Medicine

## 2018-09-05 ENCOUNTER — Other Ambulatory Visit: Payer: Self-pay

## 2018-09-06 ENCOUNTER — Ambulatory Visit (INDEPENDENT_AMBULATORY_CARE_PROVIDER_SITE_OTHER): Payer: Medicare Other | Admitting: Family Medicine

## 2018-09-06 ENCOUNTER — Encounter: Payer: Self-pay | Admitting: Family Medicine

## 2018-09-06 VITALS — BP 122/64 | HR 64 | Temp 98.2°F | Resp 12 | Ht 66.0 in | Wt 193.0 lb

## 2018-09-06 DIAGNOSIS — M25512 Pain in left shoulder: Secondary | ICD-10-CM

## 2018-09-06 DIAGNOSIS — S46212A Strain of muscle, fascia and tendon of other parts of biceps, left arm, initial encounter: Secondary | ICD-10-CM | POA: Diagnosis not present

## 2018-09-06 DIAGNOSIS — M7122 Synovial cyst of popliteal space [Baker], left knee: Secondary | ICD-10-CM | POA: Diagnosis not present

## 2018-09-06 NOTE — Patient Instructions (Addendum)
F/U for Physical 3-4 MONTHS - OKAY TO CALL BACK

## 2018-09-06 NOTE — Progress Notes (Signed)
   Subjective:    Patient ID: Betty Dawson, female    DOB: 1955/10/19, 63 y.o.   MRN: [id number]  Patient presents for L Arm Pain (x1 week- pain and edema to arm- no injury)  Patient here with left arm pain for the past week.  Feels like her left arm has a swelling or lump in it.  She was having sharp pains in the area now it is more like a dull ache.  She does not remember any particular injury.  She does have history of degenerative joint disease and osteoarthritis she actually had rotator cuff repairs on the right side.  She also has a chronic Baker's cyst this has been bothering her more behind her left knee.  Would like to have this evaluated by orthopedics.  She has been taking Tylenol for pain does not want anything stronger.  Note she denies any tingling numbness in the fingertips   Review Of Systems:  GEN- denies fatigue, fever, weight loss,weakness, recent illness HEENT- denies eye drainage, change in vision, nasal discharge, CVS- denies chest pain, palpitations RESP- denies SOB, cough, wheeze ABD- denies N/V, change in stools, abd pain GU- denies dysuria, hematuria, dribbling, incontinence MSK- +joint pain, muscle aches, injury Neuro- denies headache, dizziness, syncope, seizure activity       Objective:    BP 122/64   Pulse 64   Temp 98.2 F (36.8 C) (Oral)   Resp 12   Ht 5\' 6"  (1.676 m)   Wt 193 lb (87.5 kg)   SpO2 98%   BMI 31.15 kg/m  GEN- NAD, alert and oriented x3 HEENT- PERRL, EOMI, non injected sclera, pink conjunctiva, MMM, oropharynx clear Neck- Supple, no thyromegaly CVS- RRR, no murmur RESP-CTAB EXT- No edema, multiple varicose veins, mild fullness behind left knee MSK-fair ROm bilat knees,  Fair ROm bilat UE, rotator cuff grossly in tact, TTP with bulge/swelling on base of left biceps compared to left, able to extend arm Pulses- Radial, DP- 2+        Assessment & Plan:      Problem List Items Addressed This Visit    None    Visit  Diagnoses    Tear of left biceps muscle, initial encounter    -  Primary   Concern based on swelling of arm for possible biceps tear, send to orthopedics first, tyelnol for pain,   Relevant Orders   Ambulatory referral to Orthopedic Surgery   Acute pain of left shoulder       Relevant Orders   Ambulatory referral to Orthopedic Surgery   Baker's cyst of knee, left       Relevant Orders   Ambulatory referral to Orthopedic Surgery      Note: This dictation was prepared with Dragon dictation along with smaller phrase technology. Any transcriptional errors that result from this process are unintentional.

## 2018-09-30 ENCOUNTER — Ambulatory Visit: Payer: Medicare Other

## 2018-09-30 ENCOUNTER — Ambulatory Visit (INDEPENDENT_AMBULATORY_CARE_PROVIDER_SITE_OTHER): Payer: Medicare Other | Admitting: Orthopedic Surgery

## 2018-09-30 ENCOUNTER — Other Ambulatory Visit: Payer: Self-pay

## 2018-09-30 ENCOUNTER — Encounter: Payer: Self-pay | Admitting: Orthopedic Surgery

## 2018-09-30 VITALS — BP 147/83 | HR 58 | Temp 97.6°F | Ht 68.0 in | Wt 190.5 lb

## 2018-09-30 DIAGNOSIS — G8929 Other chronic pain: Secondary | ICD-10-CM | POA: Diagnosis not present

## 2018-09-30 DIAGNOSIS — M25562 Pain in left knee: Secondary | ICD-10-CM | POA: Diagnosis not present

## 2018-09-30 DIAGNOSIS — M7122 Synovial cyst of popliteal space [Baker], left knee: Secondary | ICD-10-CM | POA: Diagnosis not present

## 2018-09-30 NOTE — Addendum Note (Signed)
Addended byCandice Camp on: 09/30/2018 11:35 AM   Modules accepted: Orders

## 2018-09-30 NOTE — Progress Notes (Signed)
Betty Dawson  09/30/2018  HISTORY SECTION :  Chief Complaint  Patient presents with  . Leg Pain    L/calf hurts and it is hard to get up on at times/ the pain is sharp stabbing at times  . Leg Pain    R/it hurts but not like the left calf   63 year old female presents with a one-month history of a mass on the back of her left knee with severe pain in the back of the knee associated with difficulty getting out of a chair denies any anterior knee pain does complain of some posterior lateral thigh pain into the left buttock but denies any back pain  She is taken Tylenol and declines any anti-inflammatory medication     Review of Systems  All other systems reviewed and are negative.    Past Medical History:  Diagnosis Date  . Anemia   . Arthritis    OA multiple joints  . Heart murmur   . Heel spur   . Hypertension   . Varicose veins     Past Surgical History:  Procedure Laterality Date  . ABDOMINAL HYSTERECTOMY     endometriosis  . COLONOSCOPY  02/21/2013   [id number] polyp measuring 6 mm in size was found in the distal/transverse colon; polypectomy was performed using snare cautery/The colon was redundant/The colon mucosa was otherwise normal/Normal mucosa in the terminal ileum  . COLONOSCOPY N/A 02/21/2013   Procedure: COLONOSCOPY;  Surgeon: Danie Binder, MD;  Location: [facility];  Service: Endoscopy;  Laterality: N/A;  10:30 AM  . FOOT SURGERY Right    heel spur  . SHOULDER ARTHROSCOPY WITH ROTATOR CUFF REPAIR Right 01/30/2016   Procedure: SHOULDER ARTHROSCOPY WITH ACROMIOPLASTY;  Surgeon: Carole Civil, MD;  Location: [facility];  Service: Orthopedics;  Laterality: Right;  pt knows to arrive at 7:15  . SHOULDER OPEN ROTATOR CUFF REPAIR Right 01/30/2016   Procedure: ROTATOR CUFF REPAIR SHOULDER OPEN;  Surgeon: Carole Civil, MD;  Location: [facility];  Service: Orthopedics;  Laterality: Right;     Allergies  Allergen Reactions  . Lisinopril Swelling      Current Outpatient Medications:  .  acetaminophen (TYLENOL) 500 MG tablet, Take 500-1,000 mg by mouth every 6 (six) hours as needed (pain). , Disp: , Rfl:  .  amLODipine (NORVASC) 5 MG tablet, Take 1 tablet (5 mg total) by mouth daily., Disp: 30 tablet, Rfl: 1 .  Ferrous Sulfate Dried 143 (45 Fe) MG TBCR, Take 1 tablet by mouth daily., Disp: 30 tablet, Rfl: 3 .  hydrochlorothiazide (HYDRODIURIL) 25 MG tablet, TAKE 1 TABLET BY MOUTH EVERY DAY, Disp: 90 tablet, Rfl: 1 .  ibuprofen (ADVIL,MOTRIN) 800 MG tablet, Take 1 tablet (800 mg total) by mouth every 8 (eight) hours as needed. (Patient not taking: Reported on 09/30/2018), Disp: 90 tablet, Rfl: 5   PHYSICAL EXAM SECTION: 1) BP (!) 147/83   Pulse (!) 58   Temp 97.6 F (36.4 C)   Ht 5\' 8"  (1.727 m)   Wt 190 lb 8 oz (86.4 kg)   BMI 28.97 kg/m   Body mass index is 28.97 kg/m. General appearance: Well-developed well-nourished no gross deformities  2) Cardiovascular normal pulse and perfusion in the lower extremities normal color without edema  3) Neurologically deep tendon reflexes are equal and normal, no sensation loss or deficits no pathologic reflexes  4) Psychological: Awake alert and oriented x3 mood and affect normal  5) Skin no lacerations or ulcerations no nodularity  no palpable masses, no erythema or nodularity  6) Musculoskeletal: Left knee large mass posterior aspect left knee different from the right side no pain or tenderness in the front of the knee range of motion is normal  Right knee normal range of motion all ligaments are stable muscle tone is normal  MEDICAL DECISION SECTION:  Encounter Diagnosis  Name Primary?  . Chronic pain of left knee Yes    Imaging xrays full series left knee no fracture dislocation osteoarthritis or joint effusion  Plan:  (Rx., Inj., surg., Frx, MRI/CT, XR:2) US guided aspiration Baker's cyst   11:22 AM Arther Abbott, MD  09/30/2018

## 2018-10-07 NOTE — Addendum Note (Signed)
Addended byCandice Camp on: 10/07/2018 12:13 PM   Modules accepted: Orders

## 2018-10-12 ENCOUNTER — Other Ambulatory Visit: Payer: Self-pay | Admitting: Orthopedic Surgery

## 2018-10-12 ENCOUNTER — Other Ambulatory Visit: Payer: Self-pay

## 2018-10-12 ENCOUNTER — Ambulatory Visit
Admission: RE | Admit: 2018-10-12 | Discharge: 2018-10-12 | Disposition: A | Discharge status: Home / Self Care | Payer: Medicare Other | Source: Ambulatory Visit | Attending: Orthopedic Surgery | Admitting: Orthopedic Surgery

## 2018-10-12 DIAGNOSIS — G8929 Other chronic pain: Secondary | ICD-10-CM

## 2018-10-12 DIAGNOSIS — M25562 Pain in left knee: Secondary | ICD-10-CM | POA: Diagnosis not present

## 2018-10-12 DIAGNOSIS — M7122 Synovial cyst of popliteal space [Baker], left knee: Secondary | ICD-10-CM

## 2018-11-04 ENCOUNTER — Other Ambulatory Visit: Payer: Self-pay | Admitting: Family Medicine

## 2018-11-28 ENCOUNTER — Other Ambulatory Visit: Payer: Self-pay

## 2018-11-28 ENCOUNTER — Ambulatory Visit (INDEPENDENT_AMBULATORY_CARE_PROVIDER_SITE_OTHER): Payer: Medicare Other

## 2018-11-28 DIAGNOSIS — Z23 Encounter for immunization: Secondary | ICD-10-CM

## 2019-02-17 ENCOUNTER — Other Ambulatory Visit: Payer: Self-pay | Admitting: Family Medicine

## 2019-02-27 ENCOUNTER — Other Ambulatory Visit: Payer: Self-pay | Admitting: Family Medicine

## 2019-02-27 ENCOUNTER — Other Ambulatory Visit: Payer: Self-pay

## 2019-02-27 ENCOUNTER — Ambulatory Visit (INDEPENDENT_AMBULATORY_CARE_PROVIDER_SITE_OTHER): Payer: Medicare Other | Admitting: Family Medicine

## 2019-02-27 ENCOUNTER — Encounter: Payer: Self-pay | Admitting: Family Medicine

## 2019-02-27 VITALS — BP 142/82 | HR 68 | Temp 98.1°F | Resp 14 | Ht 68.0 in | Wt 195.0 lb

## 2019-02-27 DIAGNOSIS — I10 Essential (primary) hypertension: Secondary | ICD-10-CM | POA: Diagnosis not present

## 2019-02-27 DIAGNOSIS — D508 Other iron deficiency anemias: Secondary | ICD-10-CM | POA: Diagnosis not present

## 2019-02-27 DIAGNOSIS — R319 Hematuria, unspecified: Secondary | ICD-10-CM

## 2019-02-27 DIAGNOSIS — M79604 Pain in right leg: Secondary | ICD-10-CM

## 2019-02-27 DIAGNOSIS — R300 Dysuria: Secondary | ICD-10-CM | POA: Diagnosis not present

## 2019-02-27 DIAGNOSIS — N390 Urinary tract infection, site not specified: Secondary | ICD-10-CM

## 2019-02-27 DIAGNOSIS — M79605 Pain in left leg: Secondary | ICD-10-CM

## 2019-02-27 LAB — URINALYSIS, ROUTINE W REFLEX MICROSCOPIC
Bilirubin Urine: NEGATIVE
Hgb urine dipstick: NEGATIVE
Hyaline Cast: NONE SEEN /LPF
Leukocytes,Ua: NEGATIVE
Nitrite: POSITIVE — AB
Specific Gravity, Urine: 1.026 (ref 1.001–1.03)
pH: 5 (ref 5.0–8.0)

## 2019-02-27 LAB — MICROSCOPIC MESSAGE

## 2019-02-27 MED ORDER — CEPHALEXIN 500 MG PO CAPS: 500.0000 mg | ORAL_CAPSULE | Freq: Four times a day (QID) | ORAL | 0 refills | Status: DC

## 2019-02-27 MED ORDER — TRAMADOL HCL 50 MG PO TABS: 50.0000 mg | ORAL_TABLET | Freq: Three times a day (TID) | ORAL | 0 refills | Status: AC | PRN

## 2019-02-27 NOTE — Progress Notes (Signed)
   Subjective:    Patient ID: Betty Dawson, female    DOB: 1955-06-24, 64 y.o.   MRN: [id number]  Patient presents for Hip Pain (x2 weeks- pain and pressure in hips/ back- B side- pain is worensing)  Pt here with   hip pain into her legs for the past 2 weeks No urinary symptoms but started taking AZO /cranberry that didn't help much. No fever, no diarrhea,  She has taken tylenol for pain with minimal improvement  Back doesn't really hurt but has an occ ache. No change in bowel movements   Iron def anemia- she had stopped taking iron suppement and restarted  No recent falls or injuries     Review Of Systems:  GEN- denies fatigue, fever, weight loss,weakness, recent illness HEENT- denies eye drainage, change in vision, nasal discharge, CVS- denies chest pain, palpitations RESP- denies SOB, cough, wheeze ABD- denies N/V, change in stools, abd pain GU- denies dysuria, hematuria, dribbling, incontinence MSK- + joint pain,+ muscle aches, injury Neuro- denies headache, dizziness, syncope, seizure activity       Objective:    BP (!) 142/82   Pulse 68   Temp 98.1 F (36.7 C) (Temporal)   Resp 14   Ht 5\' 8"  (1.727 m)   Wt 195 lb (88.5 kg)   SpO2 97%   BMI 29.65 kg/m  GEN- NAD, alert and oriented x3,fatigued appearing HEENT- PERRL, EOMI, non injected sclera, pink conjunctiva, Neck- Supple, CVS- RRR, no murmur RESP-CTAB ABD-NABS,soft,NT,ND, no CVA tenderness Spine- NT, fair ROM, fair ROM bilat hips, mild ttp over ant thigh, neg SLR  Neuro- strength in tact, sensation grossly in tact LE EXT- No edema Pulses- Radial  2+        Assessment & Plan:      Problem List Items Addressed This Visit      Unprioritized   Anemia, iron deficiency   Relevant Orders   Iron, TIBC and Ferritin Panel   HTN (hypertension)    Bp mildly elevated Check renal function UA concerning for UTI but she doesn't have the classic symptoms for UTI? Start keflex Unclear myalgias in thighs,  not actually in her hips or her back She can take NSAID/ also refilled ultram has used in the past  Check labs for the iron def anemia as well No red flags one exam       Relevant Orders   CBC with Differential/Platelet   Comprehensive metabolic panel    Other Visit Diagnoses    Urinary tract infection with hematuria, site unspecified    -  Primary   Relevant Medications   cephALEXin (KEFLEX) 500 MG capsule   Other Relevant Orders   Urinalysis, Routine w reflex microscopic   Urine Culture   Bilateral leg pain          Note: This dictation was prepared with Dragon dictation along with smaller phrase technology. Any transcriptional errors that result from this process are unintentional.

## 2019-02-27 NOTE — Assessment & Plan Note (Signed)
Bp mildly elevated Check renal function UA concerning for UTI but she doesn't have the classic symptoms for UTI? Start keflex Unclear myalgias in thighs, not actually in her hips or her back She can take NSAID/ also refilled ultram has used in the past  Check labs for the iron def anemia as well No red flags one exam

## 2019-02-27 NOTE — Patient Instructions (Addendum)
We will call with results F/U 6 months for physical

## 2019-02-28 LAB — CBC WITH DIFFERENTIAL/PLATELET
Absolute Monocytes: 442 cells/uL (ref 200–950)
Basophils Absolute: 33 cells/uL (ref 0–200)
Basophils Relative: 0.5 %
Eosinophils Absolute: 52 cells/uL (ref 15–500)
Eosinophils Relative: 0.8 %
HCT: 35.5 % (ref 35.0–45.0)
Hemoglobin: 11.5 g/dL — ABNORMAL LOW (ref 11.7–15.5)
Lymphs Abs: 1625 cells/uL (ref 850–3900)
MCH: 27.8 pg (ref 27.0–33.0)
MCHC: 32.4 g/dL (ref 32.0–36.0)
MCV: 86 fL (ref 80.0–100.0)
MPV: 11.5 fL (ref 7.5–12.5)
Monocytes Relative: 6.8 %
Neutro Abs: 4349 cells/uL (ref 1500–7800)
Neutrophils Relative %: 66.9 %
Platelets: 294 10*3/uL (ref 140–400)
RBC: 4.13 10*6/uL (ref 3.80–5.10)
RDW: 12 % (ref 11.0–15.0)
Total Lymphocyte: 25 %
WBC: 6.5 10*3/uL (ref 3.8–10.8)

## 2019-02-28 LAB — IRON,TIBC AND FERRITIN PANEL
%SAT: 28 % (calc) (ref 16–45)
Ferritin: 158 ng/mL (ref 16–288)
Iron: 78 ug/dL (ref 45–160)
TIBC: 282 mcg/dL (calc) (ref 250–450)

## 2019-02-28 LAB — COMPREHENSIVE METABOLIC PANEL
AG Ratio: 1.2 (calc) (ref 1.0–2.5)
ALT: 8 U/L (ref 6–29)
AST: 14 U/L (ref 10–35)
Albumin: 4 g/dL (ref 3.6–5.1)
Alkaline phosphatase (APISO): 91 U/L (ref 37–153)
BUN: 15 mg/dL (ref 7–25)
CO2: 33 mmol/L — ABNORMAL HIGH (ref 20–32)
Calcium: 9.6 mg/dL (ref 8.6–10.4)
Chloride: 98 mmol/L (ref 98–110)
Creat: 0.77 mg/dL (ref 0.50–0.99)
Globulin: 3.4 g/dL (calc) (ref 1.9–3.7)
Glucose, Bld: 93 mg/dL (ref 65–99)
Potassium: 4.1 mmol/L (ref 3.5–5.3)
Sodium: 143 mmol/L (ref 135–146)
Total Bilirubin: 0.5 mg/dL (ref 0.2–1.2)
Total Protein: 7.4 g/dL (ref 6.1–8.1)

## 2019-03-01 LAB — URINE CULTURE
MICRO NUMBER:: 10101718
SPECIMEN QUALITY:: ADEQUATE

## 2019-03-07 ENCOUNTER — Encounter: Payer: Self-pay | Admitting: Family Medicine

## 2019-04-06 ENCOUNTER — Emergency Department (HOSPITAL_COMMUNITY)
Admission: EM | Admit: 2019-04-06 | Discharge: 2019-04-06 | Disposition: A | Discharge status: Home / Self Care | Payer: Medicare HMO | Attending: Emergency Medicine | Admitting: Emergency Medicine

## 2019-04-06 ENCOUNTER — Emergency Department (HOSPITAL_COMMUNITY): Payer: Medicare HMO

## 2019-04-06 ENCOUNTER — Other Ambulatory Visit: Payer: Self-pay

## 2019-04-06 ENCOUNTER — Encounter (HOSPITAL_COMMUNITY): Payer: Self-pay | Admitting: Emergency Medicine

## 2019-04-06 DIAGNOSIS — M5442 Lumbago with sciatica, left side: Secondary | ICD-10-CM | POA: Insufficient documentation

## 2019-04-06 DIAGNOSIS — Z79899 Other long term (current) drug therapy: Secondary | ICD-10-CM | POA: Insufficient documentation

## 2019-04-06 DIAGNOSIS — M545 Low back pain: Secondary | ICD-10-CM | POA: Diagnosis not present

## 2019-04-06 DIAGNOSIS — M5441 Lumbago with sciatica, right side: Secondary | ICD-10-CM | POA: Diagnosis not present

## 2019-04-06 DIAGNOSIS — I10 Essential (primary) hypertension: Secondary | ICD-10-CM | POA: Insufficient documentation

## 2019-04-06 LAB — URINALYSIS, ROUTINE W REFLEX MICROSCOPIC
Bilirubin Urine: NEGATIVE
Glucose, UA: NEGATIVE mg/dL
Hgb urine dipstick: NEGATIVE
Ketones, ur: NEGATIVE mg/dL
Nitrite: NEGATIVE
Protein, ur: NEGATIVE mg/dL
Specific Gravity, Urine: 1.027 (ref 1.005–1.030)
pH: 5 (ref 5.0–8.0)

## 2019-04-06 MED ORDER — PREDNISONE 10 MG PO TABS: ORAL_TABLET | ORAL | 0 refills | Status: DC

## 2019-04-06 MED ORDER — METHOCARBAMOL 500 MG PO TABS: 500.0000 mg | ORAL_TABLET | Freq: Two times a day (BID) | ORAL | 0 refills | Status: DC

## 2019-04-06 MED ORDER — PREDNISONE 50 MG PO TABS
60.0000 mg | ORAL_TABLET | Freq: Once | ORAL | Status: AC
  Administered 2019-04-06: 60 mg via ORAL
  Filled 2019-04-06: qty 1

## 2019-04-06 NOTE — ED Provider Notes (Signed)
[facility] Provider Note   CSN: [id number] Arrival date & time: 04/06/19  1103     History Chief Complaint  Patient presents with  . Back Pain    Betty Dawson is a 64 y.o. female with a history of OA, htn, anemia presenting with low back pain with aching pain into her posterior bilateral thighs which have been present for the past month but with worsening symptoms.  She was seen by her pcp last month and diagnosed with a uti, her sx being urgency which have resolved after completion of the antibiotic.  She denies weakness or numbness in her legs and denies urinary or fecal incontinence.  She has taken tylenol without relief of sx.  Denies abdominal pain or swelling, no fevers, chills, n/v.    The history is provided by the patient.       Past Medical History:  Diagnosis Date  . Anemia   . Arthritis    OA multiple joints  . Heart murmur   . Heel spur   . Hypertension   . Varicose veins     Patient Active Problem List   Diagnosis Date Noted  . Anemia, iron deficiency 10/30/2016  . Complete tear of right rotator cuff   . Subacromial impingement of right shoulder   . Heartburn 07/21/2011  . Ankle pain, chronic 06/02/2011  . HTN (hypertension) 05/20/2011  . OA (osteoarthritis) 05/20/2011  . Varicose veins 05/20/2011  . Obesity (BMI 30-39.9) 05/20/2011    Past Surgical History:  Procedure Laterality Date  . ABDOMINAL HYSTERECTOMY     endometriosis  . COLONOSCOPY  02/21/2013   [id number] polyp measuring 6 mm in size was found in the distal/transverse colon; polypectomy was performed using snare cautery/The colon was redundant/The colon mucosa was otherwise normal/Normal mucosa in the terminal ileum  . COLONOSCOPY N/A 02/21/2013   Procedure: COLONOSCOPY;  Surgeon: Danie Binder, MD;  Location: [facility];  Service: Endoscopy;  Laterality: N/A;  10:30 AM  . FOOT SURGERY Right    heel spur  . SHOULDER ARTHROSCOPY WITH ROTATOR CUFF REPAIR Right  01/30/2016   Procedure: SHOULDER ARTHROSCOPY WITH ACROMIOPLASTY;  Surgeon: Carole Civil, MD;  Location: [facility];  Service: Orthopedics;  Laterality: Right;  pt knows to arrive at 7:15  . SHOULDER OPEN ROTATOR CUFF REPAIR Right 01/30/2016   Procedure: ROTATOR CUFF REPAIR SHOULDER OPEN;  Surgeon: Carole Civil, MD;  Location: [facility];  Service: Orthopedics;  Laterality: Right;     OB History   No obstetric history on file.     Family History  Problem Relation Age of Onset  . Hypertension Mother   . Cancer Mother   . Diabetes Sister   . Hyperlipidemia Sister   . Hypertension Sister   . Diabetes Brother   . Hypertension Brother     Social History   Tobacco Use  . Smoking status: Never Smoker  . Smokeless tobacco: Never Used  Substance Use Topics  . Alcohol use: No  . Drug use: No    Home Medications Prior to Admission medications   Medication Sig Start Date End Date Taking? Authorizing Provider  acetaminophen (TYLENOL) 500 MG tablet Take 1,000 mg by mouth every 6 (six) hours as needed (pain).    Yes [provider]  amLODipine (NORVASC) 5 MG tablet Take 1 tablet (5 mg total) by mouth daily. 12/22/17  Yes [place], Modena Nunnery, MD  Ferrous Sulfate Dried 143 (45 Fe) MG TBCR Take 1 tablet  by mouth daily. 05/07/17  Yes [place], Modena Nunnery, MD  hydrochlorothiazide (HYDRODIURIL) 25 MG tablet TAKE 1 TABLET BY MOUTH EVERY DAY Patient taking differently: Take 25 mg by mouth daily.  02/27/19  Yes [place], Modena Nunnery, MD  ibuprofen (ADVIL,MOTRIN) 800 MG tablet Take 1 tablet (800 mg total) by mouth every 8 (eight) hours as needed. 03/23/16  Yes Carole Civil, MD  cephALEXin (KEFLEX) 500 MG capsule Take 1 capsule (500 mg total) by mouth 4 (four) times daily. Patient not taking: Reported on 04/06/2019 02/27/19   Alycia Rossetti, MD  methocarbamol (ROBAXIN) 500 MG tablet Take 1 tablet (500 mg total) by mouth 2 (two) times daily. 04/06/19   Farida Mcreynolds, Almyra Free, PA-C  predniSONE (DELTASONE) 10  MG tablet Take 6 tablets day one, 5 tablets day two, 4 tablets day three, 3 tablets day four, 2 tablets day five, then 1 tablet day six 04/06/19   Edgardo Petrenko, Almyra Free, PA-C    Allergies    Lisinopril  Review of Systems   Review of Systems  Constitutional: Negative for fever.  Respiratory: Negative for shortness of breath.   Cardiovascular: Negative for chest pain and leg swelling.  Gastrointestinal: Negative for abdominal distention, abdominal pain and constipation.  Genitourinary: Negative for difficulty urinating, dysuria, flank pain, frequency and urgency.  Musculoskeletal: Positive for back pain. Negative for gait problem and joint swelling.  Skin: Negative for rash.  Neurological: Negative for weakness and numbness.    Physical Exam Updated Vital Signs BP (!) 156/71 (BP Location: Left Arm)   Pulse 66   Temp 99.2 F (37.3 C) (Oral)   Resp 12   Ht 5\' 8"  (1.727 m)   Wt 87.1 kg   SpO2 100%   BMI 29.19 kg/m   Physical Exam Vitals and nursing note reviewed.  Constitutional:      Appearance: She is well-developed.  HENT:     Head: Normocephalic.  Eyes:     Conjunctiva/sclera: Conjunctivae normal.  Cardiovascular:     Rate and Rhythm: Normal rate.     Comments: Pedal pulses normal. Pulmonary:     Effort: Pulmonary effort is normal.  Abdominal:     General: Bowel sounds are normal. There is no distension.     Palpations: Abdomen is soft. There is no mass.  Musculoskeletal:        General: Normal range of motion.     Cervical back: Normal range of motion and neck supple.     Lumbar back: No swelling, edema, spasms, tenderness or bony tenderness.     Comments: No ttp.lumbar or paralumbar regions.    Skin:    General: Skin is warm and dry.  Neurological:     Mental Status: She is alert.     Sensory: No sensory deficit.     Motor: No tremor or atrophy.     Gait: Gait normal.     Deep Tendon Reflexes:     Reflex Scores:      Patellar reflexes are 2+ on the right side and 2+  on the left side.    Comments: No strength deficit noted in hip and knee flexor and extensor muscle groups.  Ankle flexion and extension intact. No foot drop. Negative SLR.      ED Results / Procedures / Treatments   Labs (all labs ordered are listed, but only abnormal results are displayed) Labs Reviewed  URINALYSIS, ROUTINE W REFLEX MICROSCOPIC - Abnormal; Notable for the following components:      Result Value  Leukocytes,Ua SMALL (*)    Bacteria, UA RARE (*)    All other components within normal limits    EKG None  Radiology DG Lumbar Spine Complete  Result Date: 04/06/2019 CLINICAL DATA:  Low back pain with radiculopathy. Left posterior leg pain. No known injury. EXAM: LUMBAR SPINE - COMPLETE 4+ VIEW COMPARISON:  Report from lumbar spine radiograph 08/14/2015, images not available. FINDINGS: Grade 1 anterolisthesis of L3 on L4 is likely degenerative and facet mediated. Minimal broad-based leftward curvature of the upper lumbar spine may be scoliosis or positional. Vertebral body heights are preserved. Disc space narrowing and endplate spurring at [id number] L4-L5 and L5-S1. Facet hypertrophy from L2-L3 through the lumbosacral junction. No evidence of fracture or focal bone lesion. Sacroiliac joints are congruent. Mild degenerative change. IMPRESSION: Degenerative change in the lumbar spine with grade 1 anterolisthesis of L3 on L4, also described on 2017 radiograph. Multilevel degenerative disc disease and facet hypertrophy. Electronically Signed   By: Keith Rake M.D.   On: 04/06/2019 13:03    Procedures Procedures (including critical care time)  Medications Ordered in ED Medications  predniSONE (DELTASONE) tablet 60 mg (60 mg Oral Given 04/06/19 1406)    ED Course  I have reviewed the triage vital signs and the nursing notes.  Pertinent labs & imaging results that were available during my care of the patient were reviewed by me and considered in my medical decision making  (see chart for details).    MDM Rules/Calculators/A&P                      Patient with acute on chronic low back pain with pattern of bilateral sciatica.  She has no neurologic deficits on her exam.  We discussed home treatment including heat therapy which she is currently using.  Added a prednisone taper along with Robaxin twice daily, plan follow-up with her PCP for recheck in 7 to 10 days if symptoms are not improving.  We also discussed return precautions including worse pain, any weakness or numbness in her extremities.  Discussed home care, activities, exercise.  No suggestion of cauda equina.  No abdominal pain or distention, no pulsatile mass to suggest AAA. Final Clinical Impression(s) / ED Diagnoses Final diagnoses:  Acute midline low back pain with bilateral sciatica    Rx / DC Orders ED Discharge Orders         Ordered    predniSONE (DELTASONE) 10 MG tablet     04/06/19 1339    methocarbamol (ROBAXIN) 500 MG tablet  2 times daily     04/06/19 1339           Evalee Jefferson, Hershal Coria 04/06/19 1646    Truddie Hidden, MD 04/07/19 [phone]

## 2019-04-06 NOTE — ED Triage Notes (Signed)
Low back pain that radiates down both legs x 1 month.  Recently tx for uti and has completed abx.

## 2019-04-06 NOTE — Discharge Instructions (Addendum)
Take your next dose of prednisone tomorrow morning.  You have been prescribed robaxin also which is a muscle relaxer any may help improve your back pain.  Avoid lifting,  Bending,  Twisting or any other activity that worsens your pain over the next week.  Apply a heating pad to your back for 20 minutes 3 times daily.  You should get rechecked if your symptoms are not better over the next 7-10,  Or you develop increased pain,  Weakness in your leg(s) or loss of bladder or bowel function - these are symptoms of a worsening condition.  Your xray shows arthritis in your lower back.

## 2019-05-05 ENCOUNTER — Ambulatory Visit (INDEPENDENT_AMBULATORY_CARE_PROVIDER_SITE_OTHER): Payer: Medicare HMO | Admitting: Family Medicine

## 2019-05-05 ENCOUNTER — Other Ambulatory Visit: Payer: Self-pay

## 2019-05-05 ENCOUNTER — Encounter: Payer: Self-pay | Admitting: Family Medicine

## 2019-05-05 VITALS — BP 134/80 | HR 80 | Temp 98.4°F | Resp 14 | Ht 68.0 in | Wt 195.0 lb

## 2019-05-05 DIAGNOSIS — M5410 Radiculopathy, site unspecified: Secondary | ICD-10-CM | POA: Diagnosis not present

## 2019-05-05 DIAGNOSIS — M5136 Other intervertebral disc degeneration, lumbar region: Secondary | ICD-10-CM

## 2019-05-05 MED ORDER — TRAMADOL HCL 50 MG PO TABS: 50.0000 mg | ORAL_TABLET | Freq: Two times a day (BID) | ORAL | 1 refills | Status: AC | PRN

## 2019-05-05 MED ORDER — HYDROCHLOROTHIAZIDE 25 MG PO TABS: 25.0000 mg | ORAL_TABLET | Freq: Every day | ORAL | 1 refills | Status: DC

## 2019-05-05 MED ORDER — GABAPENTIN 100 MG PO CAPS: ORAL_CAPSULE | ORAL | 3 refills | Status: DC

## 2019-05-05 MED ORDER — AMLODIPINE BESYLATE 5 MG PO TABS: 5.0000 mg | ORAL_TABLET | Freq: Every day | ORAL | 1 refills | Status: DC

## 2019-05-05 NOTE — Progress Notes (Signed)
   Subjective:    Patient ID: Betty Dawson, female    DOB: [birth year], 64 y.o.   MRN: [id number]  Patient presents for ER F/U (pinched nerve- lower back and L sided leg pain)   Worsening back pain with left radicular symptoms past few weeks. . Seen in the ER on 3/11 xray obtained   She was given steroids and muscle relaxer (Robaxin)  She has numbness in some of her toes on both feet.  No falls  No change in bowel or bladder   Lumbar spine xray in ER  IMPRESSION: Degenerative change in the lumbar spine with grade 1 anterolisthesis of L3 on L4, also described on 2017 radiograph. Multilevel degenerative disc disease and facet hypertrophy.  Review Of Systems:  GEN- denies fatigue, fever, weight loss,weakness, recent illness HEENT- denies eye drainage, change in vision, nasal discharge, CVS- denies chest pain, palpitations RESP- denies SOB, cough, wheeze ABD- denies N/V, change in stools, abd pain GU- denies dysuria, hematuria, dribbling, incontinence MSK- + joint pain, muscle aches, injury Neuro- denies headache, dizziness, syncope, seizure activity       Objective:    BP 134/80   Pulse 80   Temp 98.4 F (36.9 C) (Temporal)   Resp 14   Ht 5\' 8"  (1.727 m)   Wt 195 lb (88.5 kg)   SpO2 98%   BMI 29.65 kg/m  GEN- NAD, alert and oriented x3 Neck- Supple,  CVS- RRR, no murmur RESP-CTAB MSK mild TTP lumbar spine, no spasm noted, +SLR left side, fair ROM spine, decreased ROM Hips/knees  Neuro- normal tone, sensation grossly in tact lower ext  EXT- No edema varicose veins  Pulses- Radial2+        Assessment & Plan:      Problem List Items Addressed This Visit      Unprioritized   DDD (degenerative disc disease), lumbar    Noted on imaging, concern she has nerve impingment possible spinal stenosis, discussed options, PT, MRI and pending result referral to spine surgeon, she wants to think about it She recalls being told 30 years ago that she needed back surgery    Will start ultram for pain, robaxin and steroids didn't help Start gabapentin 100-300mg  at bedtime for nerve irritation      Relevant Medications   traMADol (ULTRAM) 50 MG tablet    Other Visit Diagnoses    Back pain with left-sided radiculopathy    -  Primary   Relevant Medications   gabapentin (NEURONTIN) 100 MG capsule   traMADol (ULTRAM) 50 MG tablet      Note: This dictation was prepared with Dragon dictation along with smaller phrase technology. Any transcriptional errors that result from this process are unintentional.

## 2019-05-05 NOTE — Assessment & Plan Note (Signed)
Noted on imaging, concern she has nerve impingment possible spinal stenosis, discussed options, PT, MRI and pending result referral to spine surgeon, she wants to think about it She recalls being told 30 years ago that she needed back surgery   Will start ultram for pain, robaxin and steroids didn't help Start gabapentin 100-300mg  at bedtime for nerve irritation

## 2019-05-05 NOTE — Patient Instructions (Signed)
Think about getting MRI or trying Physical therapy Use ultram for pain  Start gabapentin at bedtime

## 2019-06-01 DIAGNOSIS — H524 Presbyopia: Secondary | ICD-10-CM | POA: Diagnosis not present

## 2019-06-07 DIAGNOSIS — Z0100 Encounter for examination of eyes and vision without abnormal findings: Secondary | ICD-10-CM | POA: Diagnosis not present

## 2019-09-11 ENCOUNTER — Other Ambulatory Visit: Payer: Self-pay | Admitting: Family Medicine

## 2019-09-11 DIAGNOSIS — R921 Mammographic calcification found on diagnostic imaging of breast: Secondary | ICD-10-CM

## 2019-10-12 ENCOUNTER — Other Ambulatory Visit: Payer: Self-pay

## 2019-10-12 ENCOUNTER — Ambulatory Visit
Admission: RE | Admit: 2019-10-12 | Discharge: 2019-10-12 | Disposition: A | Discharge status: Home / Self Care | Payer: Medicare HMO | Source: Ambulatory Visit | Attending: Family Medicine | Admitting: Family Medicine

## 2019-10-12 DIAGNOSIS — R921 Mammographic calcification found on diagnostic imaging of breast: Secondary | ICD-10-CM

## 2019-10-18 ENCOUNTER — Other Ambulatory Visit: Payer: Self-pay

## 2019-10-18 ENCOUNTER — Ambulatory Visit (INDEPENDENT_AMBULATORY_CARE_PROVIDER_SITE_OTHER): Payer: Medicare HMO

## 2019-10-18 DIAGNOSIS — Z23 Encounter for immunization: Secondary | ICD-10-CM | POA: Diagnosis not present

## 2019-11-22 ENCOUNTER — Other Ambulatory Visit: Payer: Self-pay | Admitting: Family Medicine

## 2020-01-01 ENCOUNTER — Other Ambulatory Visit: Payer: Self-pay | Admitting: *Deleted

## 2020-01-01 MED ORDER — AMLODIPINE BESYLATE 5 MG PO TABS: 5.0000 mg | ORAL_TABLET | Freq: Every day | ORAL | 1 refills | Status: DC

## 2020-01-01 MED ORDER — HYDROCHLOROTHIAZIDE 25 MG PO TABS: 25.0000 mg | ORAL_TABLET | Freq: Every day | ORAL | 1 refills | Status: DC

## 2020-01-01 MED ORDER — GABAPENTIN 100 MG PO CAPS
ORAL_CAPSULE | ORAL | 3 refills | Status: DC
Start: 2020-01-01 — End: 2020-04-24

## 2020-04-24 ENCOUNTER — Encounter: Payer: Self-pay | Admitting: Internal Medicine

## 2020-04-24 ENCOUNTER — Other Ambulatory Visit: Payer: Self-pay

## 2020-04-24 ENCOUNTER — Ambulatory Visit (INDEPENDENT_AMBULATORY_CARE_PROVIDER_SITE_OTHER): Payer: Medicare Other | Admitting: Internal Medicine

## 2020-04-24 VITALS — BP 148/82 | HR 60 | Resp 18 | Ht 68.0 in | Wt 196.1 lb

## 2020-04-24 DIAGNOSIS — M8949 Other hypertrophic osteoarthropathy, multiple sites: Secondary | ICD-10-CM | POA: Diagnosis not present

## 2020-04-24 DIAGNOSIS — Z780 Asymptomatic menopausal state: Secondary | ICD-10-CM

## 2020-04-24 DIAGNOSIS — Z7689 Persons encountering health services in other specified circumstances: Secondary | ICD-10-CM | POA: Diagnosis not present

## 2020-04-24 DIAGNOSIS — M5136 Other intervertebral disc degeneration, lumbar region: Secondary | ICD-10-CM

## 2020-04-24 DIAGNOSIS — D508 Other iron deficiency anemias: Secondary | ICD-10-CM | POA: Diagnosis not present

## 2020-04-24 DIAGNOSIS — I10 Essential (primary) hypertension: Secondary | ICD-10-CM | POA: Diagnosis not present

## 2020-04-24 DIAGNOSIS — Z23 Encounter for immunization: Secondary | ICD-10-CM

## 2020-04-24 DIAGNOSIS — M159 Polyosteoarthritis, unspecified: Secondary | ICD-10-CM

## 2020-04-24 MED ORDER — GABAPENTIN 300 MG PO CAPS: 300.0000 mg | ORAL_CAPSULE | Freq: Every day | ORAL | 5 refills | Status: DC

## 2020-04-24 NOTE — Progress Notes (Signed)
New Patient Office Visit  Subjective:  Patient ID: Betty Dawson, female    DOB: 1955-10-09  Age: 65 y.o. MRN: [id number]  CC:  Chief Complaint  Patient presents with  . New Patient (Initial Visit)    New patient just establishing care     HPI Betty Dawson is a 65 year old female with PMH of HTN, OA, DDD of lumbar spine and iron deficiency anemia who presents for establishing care. She is a former patient of Dr Buelah Manis.  Her BP was elevated today, but she has not taken her BP medications today. She denies any headache, dizziness, chest pain, dyspnea or palpitations.  She has h/o chronic low back pain, which radiates to left LE. She takes Gabapentin and PRN Tylenol for it.  She is up-to-date with COVID and flu vaccine. She received PCV13 and first dose of shingrix vaccine in the office today.  Past Medical History:  Diagnosis Date  . Anemia   . Arthritis    OA multiple joints  . Heart murmur   . Heel spur   . Hypertension   . Varicose veins     Past Surgical History:  Procedure Laterality Date  . ABDOMINAL HYSTERECTOMY     endometriosis  . BREAST BIOPSY Right 2017   x2  . COLONOSCOPY  02/21/2013   QVZ:DGLOVF polyp measuring 6 mm in size was found in the distal/transverse colon; polypectomy was performed using snare cautery/The colon was redundant/The colon mucosa was otherwise normal/Normal mucosa in the terminal ileum  . COLONOSCOPY N/A 02/21/2013   Procedure: COLONOSCOPY;  Surgeon: Danie Binder, MD;  Location: [facility];  Service: Endoscopy;  Laterality: N/A;  10:30 AM  . FOOT SURGERY Right    heel spur  . SHOULDER ARTHROSCOPY WITH ROTATOR CUFF REPAIR Right 01/30/2016   Procedure: SHOULDER ARTHROSCOPY WITH ACROMIOPLASTY;  Surgeon: Carole Civil, MD;  Location: [facility];  Service: Orthopedics;  Laterality: Right;  pt knows to arrive at 7:15  . SHOULDER OPEN ROTATOR CUFF REPAIR Right 01/30/2016   Procedure: ROTATOR CUFF REPAIR SHOULDER OPEN;  Surgeon: Carole Civil, MD;  Location: [facility];  Service: Orthopedics;  Laterality: Right;    Family History  Problem Relation Age of Onset  . Hypertension Mother   . Cancer Mother   . Diabetes Sister   . Hyperlipidemia Sister   . Hypertension Sister   . Diabetes Brother   . Hypertension Brother     Social History   Socioeconomic History  . Marital status: Single    Spouse name: Not on file  . Number of children: Not on file  . Years of education: Not on file  . Highest education level: Not on file  Occupational History  . Not on file  Tobacco Use  . Smoking status: Never Smoker  . Smokeless tobacco: Never Used  Substance and Sexual Activity  . Alcohol use: No  . Drug use: No  . Sexual activity: Not Currently    Birth control/protection: Surgical  Other Topics Concern  . Not on file  Social History Narrative  . Not on file   Social Determinants of Health   Financial Resource Strain: Not on file  Food Insecurity: Not on file  Transportation Needs: Not on file  Physical Activity: Not on file  Stress: Not on file  Social Connections: Not on file  Intimate Partner Violence: Not on file    ROS Review of Systems  Constitutional: Negative for chills and fever.  HENT: Negative  for congestion, sinus pressure, sinus pain and sore throat.   Eyes: Negative for pain and discharge.  Respiratory: Negative for cough and shortness of breath.   Cardiovascular: Negative for chest pain and palpitations.  Gastrointestinal: Negative for abdominal pain, constipation, diarrhea, nausea and vomiting.  Endocrine: Negative for polydipsia and polyuria.  Genitourinary: Negative for dysuria and hematuria.  Musculoskeletal: Positive for arthralgias and back pain. Negative for neck pain and neck stiffness.  Skin: Negative for rash.  Neurological: Negative for dizziness and weakness.  Psychiatric/Behavioral: Negative for agitation and behavioral problems.    Objective:   Today's Vitals: BP (!)  148/82 (BP Location: Right Arm, Patient Position: Sitting, Cuff Size: Normal)   Pulse 60   Resp 18   Ht '5\' 8"'  (1.727 m)   Wt 196 lb 1.9 oz (89 kg)   SpO2 97%   BMI 29.82 kg/m   Physical Exam Vitals reviewed.  Constitutional:      General: She is not in acute distress.    Appearance: She is not diaphoretic.  HENT:     Head: Normocephalic and atraumatic.     Nose: Nose normal.     Mouth/Throat:     Mouth: Mucous membranes are moist.  Eyes:     General: No scleral icterus.    Extraocular Movements: Extraocular movements intact.  Cardiovascular:     Rate and Rhythm: Normal rate and regular rhythm.     Pulses: Normal pulses.     Heart sounds: Normal heart sounds. No murmur heard.   Pulmonary:     Breath sounds: Normal breath sounds. No wheezing or rales.  Abdominal:     Palpations: Abdomen is soft.     Tenderness: There is no abdominal tenderness.  Musculoskeletal:     Cervical back: Neck supple. No tenderness.     Right lower leg: No edema.     Left lower leg: No edema.  Skin:    General: Skin is warm.     Findings: No rash.  Neurological:     General: No focal deficit present.     Mental Status: She is alert and oriented to person, place, and time.  Psychiatric:        Mood and Affect: Mood normal.        Behavior: Behavior normal.     Assessment & Plan:   Problem List Items Addressed This Visit      Encounter to establish care - Primary   Care established Previous chart reviewed History and medications reviewed with the patient     Relevant Orders  CBC with Differential  CMP14+EGFR  HgB A1c  Lipid panel  TSH + free T4  Vitamin D (25 hydroxy)    Cardiovascular and Mediastinum   HTN (hypertension)    BP Readings from Last 1 Encounters:  04/24/20 (!) 148/82   Elevated today as patient has not had medications today Overall well-controlled with Amlodipine and HCTZ Counseled for compliance with the medications Advised DASH diet and moderate  exercise/walking, at least 150 mins/week       Relevant Orders   CBC with Differential   CMP14+EGFR   HgB A1c   Lipid panel     Musculoskeletal and Integument   OA (osteoarthritis)    Knee and ankles involvement Tylenol PRN      DDD (degenerative disc disease), lumbar    Chronic low back pain radiating to left LE On Gabapentin and PRN Tylenol      Relevant Medications   gabapentin (NEURONTIN) 300  MG capsule     Other   Anemia, iron deficiency    On iron supplements Check CBC       Other Visit Diagnoses    Post-menopausal       Relevant Orders   DG Bone Density   Vitamin D (25 hydroxy)      Outpatient Encounter Medications as of 04/24/2020  Medication Sig  . acetaminophen (TYLENOL) 500 MG tablet Take 1,000 mg by mouth every 6 (six) hours as needed (pain).   Marland Kitchen amLODipine (NORVASC) 5 MG tablet Take 1 tablet (5 mg total) by mouth daily.  . Ferrous Sulfate Dried 143 (45 Fe) MG TBCR Take 1 tablet by mouth daily.  . hydrochlorothiazide (HYDRODIURIL) 25 MG tablet Take 1 tablet (25 mg total) by mouth daily.  Marland Kitchen ibuprofen (ADVIL,MOTRIN) 800 MG tablet Take 1 tablet (800 mg total) by mouth every 8 (eight) hours as needed.  . [DISCONTINUED] gabapentin (NEURONTIN) 100 MG capsule Take 1-3 capsules at bedtime  . gabapentin (NEURONTIN) 300 MG capsule Take 1 capsule (300 mg total) by mouth at bedtime. Take 1-3 capsules at bedtime   No facility-administered encounter medications on file as of 04/24/2020.    Follow-up: Return in about 3 months (around 07/25/2020) for Annual physical.   Lindell Spar, MD

## 2020-04-24 NOTE — Patient Instructions (Signed)
Please continue taking medications as prescribed.  You were given PCV13 and Shingrix vaccine - first dose in the office today. Okay to apply ice or take Ibuprofen for local pain/soreness.  Please follow low salt diet and perform moderate exercise/walking as tolerated.

## 2020-04-24 NOTE — Assessment & Plan Note (Signed)
Care established Previous chart reviewed History and medications reviewed with the patient 

## 2020-04-24 NOTE — Assessment & Plan Note (Signed)
BP Readings from Last 1 Encounters:  04/24/20 (!) 148/82   Elevated today as patient has not had medications today Overall well-controlled with Amlodipine and HCTZ Counseled for compliance with the medications Advised DASH diet and moderate exercise/walking, at least 150 mins/week

## 2020-04-24 NOTE — Assessment & Plan Note (Signed)
On iron supplements Check CBC

## 2020-04-24 NOTE — Addendum Note (Signed)
Addended by: Zacarias Pontes R on: 04/24/2020 09:16 AM   Modules accepted: Orders

## 2020-04-24 NOTE — Assessment & Plan Note (Signed)
Chronic low back pain radiating to left LE On Gabapentin and PRN Tylenol

## 2020-04-24 NOTE — Assessment & Plan Note (Signed)
Knee and ankles involvement Tylenol PRN

## 2020-05-03 ENCOUNTER — Ambulatory Visit (HOSPITAL_COMMUNITY)
Admission: RE | Admit: 2020-05-03 | Discharge: 2020-05-03 | Disposition: A | Discharge status: Home / Self Care | Payer: Medicare Other | Source: Ambulatory Visit | Attending: Internal Medicine | Admitting: Internal Medicine

## 2020-05-03 DIAGNOSIS — Z780 Asymptomatic menopausal state: Secondary | ICD-10-CM | POA: Diagnosis not present

## 2020-05-03 DIAGNOSIS — M85852 Other specified disorders of bone density and structure, left thigh: Secondary | ICD-10-CM | POA: Diagnosis not present

## 2020-07-15 DIAGNOSIS — M1990 Unspecified osteoarthritis, unspecified site: Secondary | ICD-10-CM | POA: Diagnosis not present

## 2020-07-15 DIAGNOSIS — I10 Essential (primary) hypertension: Secondary | ICD-10-CM | POA: Diagnosis not present

## 2020-07-15 DIAGNOSIS — Z780 Asymptomatic menopausal state: Secondary | ICD-10-CM | POA: Diagnosis not present

## 2020-07-15 DIAGNOSIS — D509 Iron deficiency anemia, unspecified: Secondary | ICD-10-CM | POA: Diagnosis not present

## 2020-07-15 DIAGNOSIS — Z7689 Persons encountering health services in other specified circumstances: Secondary | ICD-10-CM | POA: Diagnosis not present

## 2020-07-16 LAB — CBC WITH DIFFERENTIAL/PLATELET
Basophils Absolute: 0 10*3/uL (ref 0.0–0.2)
Basos: 1 %
EOS (ABSOLUTE): 0.1 10*3/uL (ref 0.0–0.4)
Eos: 2 %
Hematocrit: 36.9 % (ref 34.0–46.6)
Hemoglobin: 11.9 g/dL (ref 11.1–15.9)
Immature Grans (Abs): 0 10*3/uL (ref 0.0–0.1)
Immature Granulocytes: 0 %
Lymphocytes Absolute: 2 10*3/uL (ref 0.7–3.1)
Lymphs: 31 %
MCH: 27.5 pg (ref 26.6–33.0)
MCHC: 32.2 g/dL (ref 31.5–35.7)
MCV: 85 fL (ref 79–97)
Monocytes Absolute: 0.5 10*3/uL (ref 0.1–0.9)
Monocytes: 8 %
Neutrophils Absolute: 3.6 10*3/uL (ref 1.4–7.0)
Neutrophils: 58 %
Platelets: 290 10*3/uL (ref 150–450)
RBC: 4.33 x10E6/uL (ref 3.77–5.28)
RDW: 12.1 % (ref 11.7–15.4)
WBC: 6.3 10*3/uL (ref 3.4–10.8)

## 2020-07-16 LAB — CMP14+EGFR
ALT: 10 IU/L (ref 0–32)
AST: 13 IU/L (ref 0–40)
Albumin/Globulin Ratio: 1.3 (ref 1.2–2.2)
Albumin: 4.3 g/dL (ref 3.8–4.8)
Alkaline Phosphatase: 115 IU/L (ref 44–121)
BUN/Creatinine Ratio: 18 (ref 12–28)
BUN: 13 mg/dL (ref 8–27)
Bilirubin Total: 0.2 mg/dL (ref 0.0–1.2)
CO2: 29 mmol/L (ref 20–29)
Calcium: 9.6 mg/dL (ref 8.7–10.3)
Chloride: 97 mmol/L (ref 96–106)
Creatinine, Ser: 0.74 mg/dL (ref 0.57–1.00)
Globulin, Total: 3.2 g/dL (ref 1.5–4.5)
Glucose: 106 mg/dL — ABNORMAL HIGH (ref 65–99)
Potassium: 4.3 mmol/L (ref 3.5–5.2)
Sodium: 137 mmol/L (ref 134–144)
Total Protein: 7.5 g/dL (ref 6.0–8.5)
eGFR: 89 mL/min/{1.73_m2} (ref >59)

## 2020-07-16 LAB — LIPID PANEL
Chol/HDL Ratio: 2.4 ratio (ref 0.0–4.4)
Cholesterol, Total: 155 mg/dL (ref 100–199)
HDL: 65 mg/dL (ref >39)
LDL Chol Calc (NIH): 80 mg/dL (ref 0–99)
Triglycerides: 46 mg/dL (ref 0–149)
VLDL Cholesterol Cal: 10 mg/dL (ref 5–40)

## 2020-07-16 LAB — VITAMIN D 25 HYDROXY (VIT D DEFICIENCY, FRACTURES): Vit D, 25-Hydroxy: 62.5 ng/mL (ref 30.0–100.0)

## 2020-07-16 LAB — HEMOGLOBIN A1C
Est. average glucose Bld gHb Est-mCnc: 111 mg/dL
Hgb A1c MFr Bld: 5.5 % (ref 4.8–5.6)

## 2020-07-16 LAB — TSH+FREE T4
Free T4: 1.32 ng/dL (ref 0.82–1.77)
TSH: 2.32 u[IU]/mL (ref 0.450–4.500)

## 2020-07-25 ENCOUNTER — Encounter: Payer: Medicare Other | Admitting: Internal Medicine

## 2020-08-13 ENCOUNTER — Encounter: Payer: Medicare Other | Admitting: Internal Medicine

## 2020-09-02 ENCOUNTER — Other Ambulatory Visit: Payer: Self-pay | Admitting: *Deleted

## 2020-09-02 MED ORDER — HYDROCHLOROTHIAZIDE 25 MG PO TABS: 25.0000 mg | ORAL_TABLET | Freq: Every day | ORAL | 1 refills | Status: DC

## 2020-09-18 ENCOUNTER — Encounter: Payer: Self-pay | Admitting: Internal Medicine

## 2020-09-18 ENCOUNTER — Ambulatory Visit (INDEPENDENT_AMBULATORY_CARE_PROVIDER_SITE_OTHER): Payer: Medicare Other | Admitting: Internal Medicine

## 2020-09-18 ENCOUNTER — Other Ambulatory Visit: Payer: Self-pay

## 2020-09-18 VITALS — BP 124/70 | HR 60 | Temp 97.8°F | Ht 68.0 in | Wt 193.0 lb

## 2020-09-18 DIAGNOSIS — Z0001 Encounter for general adult medical examination with abnormal findings: Secondary | ICD-10-CM | POA: Diagnosis not present

## 2020-09-18 DIAGNOSIS — M542 Cervicalgia: Secondary | ICD-10-CM | POA: Diagnosis not present

## 2020-09-18 DIAGNOSIS — M5136 Other intervertebral disc degeneration, lumbar region: Secondary | ICD-10-CM

## 2020-09-18 DIAGNOSIS — Z23 Encounter for immunization: Secondary | ICD-10-CM

## 2020-09-18 DIAGNOSIS — I10 Essential (primary) hypertension: Secondary | ICD-10-CM

## 2020-09-18 MED ORDER — GABAPENTIN 300 MG PO CAPS: 300.0000 mg | ORAL_CAPSULE | Freq: Every day | ORAL | 5 refills | Status: DC

## 2020-09-18 MED ORDER — AMLODIPINE BESYLATE 5 MG PO TABS: 5.0000 mg | ORAL_TABLET | Freq: Every day | ORAL | 1 refills | Status: DC

## 2020-09-18 MED ORDER — CYCLOBENZAPRINE HCL 5 MG PO TABS: 5.0000 mg | ORAL_TABLET | Freq: Every evening | ORAL | 1 refills | Status: DC | PRN

## 2020-09-18 NOTE — Assessment & Plan Note (Signed)
BP Readings from Last 1 Encounters:  09/18/20 124/70   Well-controlled with Amlodipine and HCTZ Counseled for compliance with the medications Advised DASH diet and moderate exercise/walking, at least 150 mins/week

## 2020-09-18 NOTE — Assessment & Plan Note (Signed)
Could be due to muscle strain at times Flexeril PRN

## 2020-09-18 NOTE — Assessment & Plan Note (Signed)
Annual exam as documented. Counseling done  re healthy lifestyle involving commitment to 150 minutes exercise per week, heart healthy diet, and attaining healthy weight.The importance of adequate sleep also discussed. Changes in health habits are decided on by the patient with goals and time frames  set for achieving them. Immunization and cancer screening needs are specifically addressed at this visit. 

## 2020-09-18 NOTE — Progress Notes (Signed)
Established Patient Office Visit  Subjective:  Patient ID: Betty Dawson, female    DOB: 1955/05/13  Age: 65 y.o. MRN: [id number]  CC:  Chief Complaint  Patient presents with   Annual Exam    CPE    HPI Betty Dawson is a 65 year old female with PMH of HTN, OA, DDD of lumbar spine and iron deficiency anemia who presents for annual physical.  BP is well-controlled. Takes medications regularly. Patient denies headache, dizziness, chest pain, dyspnea or palpitations.  She c/o chronic lower back and neck pain. Back pain radiates to left LE. She also reports tingling/burning pain over toes b/l. Denies any recent injury. She reports intermittent neck pain, radiating to right shoulder and has difficulty moving neck during those episodes.  She is up-to-date with COVID and flu vaccine. She received second dose of shingrix vaccine in the office today.   Past Medical History:  Diagnosis Date   Anemia    Arthritis    OA multiple joints   Heart murmur    Heel spur    Hypertension    Varicose veins     Past Surgical History:  Procedure Laterality Date   ABDOMINAL HYSTERECTOMY     endometriosis   BREAST BIOPSY Right 2017   x2   COLONOSCOPY  02/21/2013   SAY:TKZSWF polyp measuring 6 mm in size was found in the distal/transverse colon; polypectomy was performed using snare cautery/The colon was redundant/The colon mucosa was otherwise normal/Normal mucosa in the terminal ileum   COLONOSCOPY N/A 02/21/2013   Procedure: COLONOSCOPY;  Surgeon: Danie Binder, MD;  Location: [facility];  Service: Endoscopy;  Laterality: N/A;  10:30 AM   FOOT SURGERY Right    heel spur   SHOULDER ARTHROSCOPY WITH ROTATOR CUFF REPAIR Right 01/30/2016   Procedure: SHOULDER ARTHROSCOPY WITH ACROMIOPLASTY;  Surgeon: Carole Civil, MD;  Location: [facility];  Service: Orthopedics;  Laterality: Right;  pt knows to arrive at [place] Right 01/30/2016   Procedure: [place] OPEN;  Surgeon: Carole Civil, MD;  Location: [facility];  Service: Orthopedics;  Laterality: Right;    Family History  Problem Relation Age of Onset   Hypertension Mother    Cancer Mother    Diabetes Sister    Hyperlipidemia Sister    Hypertension Sister    Diabetes Brother    Hypertension Brother     Social History   Socioeconomic History   Marital status: Single    Spouse name: Not on file   Number of children: Not on file   Years of education: Not on file   Highest education level: Not on file  Occupational History   Not on file  Tobacco Use   Smoking status: Never   Smokeless tobacco: Never  Substance and Sexual Activity   Alcohol use: No   Drug use: No   Sexual activity: Not Currently    Birth control/protection: Surgical  Other Topics Concern   Not on file  Social History Narrative   Not on file   Social Determinants of Health   Financial Resource Strain: Not on file  Food Insecurity: Not on file  Transportation Needs: Not on file  Physical Activity: Not on file  Stress: Not on file  Social Connections: Not on file  Intimate Partner Violence: Not on file    Outpatient Medications Prior to Visit  Medication Sig Dispense Refill   acetaminophen (TYLENOL) 500 MG tablet Take  1,000 mg by mouth every 6 (six) hours as needed (pain).      Ferrous Sulfate Dried 143 (45 Fe) MG TBCR Take 1 tablet by mouth daily. 30 tablet 3   hydrochlorothiazide (HYDRODIURIL) 25 MG tablet Take 1 tablet (25 mg total) by mouth daily. 90 tablet 1   ibuprofen (ADVIL,MOTRIN) 800 MG tablet Take 1 tablet (800 mg total) by mouth every 8 (eight) hours as needed. 90 tablet 5   amLODipine (NORVASC) 5 MG tablet Take 1 tablet (5 mg total) by mouth daily. 90 tablet 1   gabapentin (NEURONTIN) 300 MG capsule Take 1 capsule (300 mg total) by mouth at bedtime. Take 1-3 capsules at bedtime 30 capsule 5   No facility-administered medications prior to visit.    Allergies  Allergen  Reactions   Lisinopril Swelling    ROS Review of Systems  Constitutional:  Negative for chills and fever.  HENT:  Negative for congestion, sinus pressure, sinus pain and sore throat.   Eyes:  Negative for pain and discharge.  Respiratory:  Negative for cough and shortness of breath.   Cardiovascular:  Negative for chest pain and palpitations.  Gastrointestinal:  Negative for abdominal pain, constipation, diarrhea, nausea and vomiting.  Endocrine: Negative for polydipsia and polyuria.  Genitourinary:  Negative for dysuria and hematuria.  Musculoskeletal:  Positive for arthralgias, back pain and neck pain. Negative for neck stiffness.  Skin:  Negative for rash.  Neurological:  Negative for dizziness and weakness.  Psychiatric/Behavioral:  Negative for agitation and behavioral problems.      Objective:    Physical Exam Vitals reviewed.  Constitutional:      General: She is not in acute distress.    Appearance: She is not diaphoretic.  HENT:     Head: Normocephalic and atraumatic.     Nose: Nose normal.     Mouth/Throat:     Mouth: Mucous membranes are moist.  Eyes:     General: No scleral icterus.    Extraocular Movements: Extraocular movements intact.  Neck:     Vascular: No carotid bruit.  Cardiovascular:     Rate and Rhythm: Normal rate and regular rhythm.     Pulses: Normal pulses.     Heart sounds: Normal heart sounds. No murmur heard. Pulmonary:     Breath sounds: Normal breath sounds. No wheezing or rales.  Abdominal:     Palpations: Abdomen is soft.     Tenderness: There is no abdominal tenderness.  Musculoskeletal:        General: Tenderness (Paraspinal around lumbar spine area) present.     Cervical back: Neck supple. No tenderness.     Right lower leg: No edema.     Left lower leg: No edema.  Skin:    General: Skin is warm.     Findings: No rash.  Neurological:     General: No focal deficit present.     Mental Status: She is alert and oriented to  person, place, and time.     Cranial Nerves: No cranial nerve deficit.     Sensory: No sensory deficit.     Motor: No weakness.  Psychiatric:        Mood and Affect: Mood normal.        Behavior: Behavior normal.    BP 124/70 (BP Location: Left Arm, Cuff Size: Normal)   Pulse 60   Temp 97.8 F (36.6 C) (Oral)   Ht 5' 8" (1.727 m)   Wt 193 lb (87.5 kg)     SpO2 98%   BMI 29.35 kg/m  Wt Readings from Last 3 Encounters:  09/18/20 193 lb (87.5 kg)  04/24/20 196 lb 1.9 oz (89 kg)  05/05/19 195 lb (88.5 kg)     Health Maintenance Due  Topic Date Due   INFLUENZA VACCINE  08/26/2020    There are no preventive care reminders to display for this patient.  Lab Results  Component Value Date   TSH 2.320 07/15/2020   Lab Results  Component Value Date   WBC 6.3 07/15/2020   HGB 11.9 07/15/2020   HCT 36.9 07/15/2020   MCV 85 07/15/2020   PLT 290 07/15/2020   Lab Results  Component Value Date   NA 137 07/15/2020   K 4.3 07/15/2020   CO2 29 07/15/2020   GLUCOSE 106 (H) 07/15/2020   BUN 13 07/15/2020   CREATININE 0.74 07/15/2020   BILITOT 0.2 07/15/2020   ALKPHOS 115 07/15/2020   AST 13 07/15/2020   ALT 10 07/15/2020   PROT 7.5 07/15/2020   ALBUMIN 4.3 07/15/2020   CALCIUM 9.6 07/15/2020   ANIONGAP 7 01/28/2016   EGFR 89 07/15/2020   Lab Results  Component Value Date   CHOL 155 07/15/2020   Lab Results  Component Value Date   HDL 65 07/15/2020   Lab Results  Component Value Date   LDLCALC 80 07/15/2020   Lab Results  Component Value Date   TRIG 46 07/15/2020   Lab Results  Component Value Date   CHOLHDL 2.4 07/15/2020   Lab Results  Component Value Date   HGBA1C 5.5 07/15/2020      Assessment & Plan:   Problem List Items Addressed This Visit       Encounter for general adult medical examination with abnormal findings - Primary   Annual exam as documented. Counseling done  re healthy lifestyle involving commitment to 150 minutes exercise per  week, heart healthy diet, and attaining healthy weight.The importance of adequate sleep also discussed. Changes in health habits are decided on by the patient with goals and time frames  set for achieving them. Immunization and cancer screening needs are specifically addressed at this visit.       Cardiovascular and Mediastinum   HTN (hypertension)    BP Readings from Last 1 Encounters:  09/18/20 124/70  Well-controlled with Amlodipine and HCTZ Counseled for compliance with the medications Advised DASH diet and moderate exercise/walking, at least 150 mins/week       Relevant Medications   amLODipine (NORVASC) 5 MG tablet     Musculoskeletal and Integument   DDD (degenerative disc disease), lumbar    Chronic low back pain radiating to left LE On Gabapentin and PRN Tylenol Added Flexeril PRN for muscle spasms      Relevant Medications   gabapentin (NEURONTIN) 300 MG capsule   cyclobenzaprine (FLEXERIL) 5 MG tablet     Other         Neck pain    Could be due to muscle strain at times Flexeril PRN      Other Visit Diagnoses     Need for viral immunization       Relevant Orders   Varicella-zoster vaccine IM (Shingrix) (Completed)       Meds ordered this encounter  Medications   gabapentin (NEURONTIN) 300 MG capsule    Sig: Take 1 capsule (300 mg total) by mouth at bedtime.    Dispense:  30 capsule    Refill:  5   amLODipine (NORVASC) 5 MG   tablet    Sig: Take 1 tablet (5 mg total) by mouth daily.    Dispense:  90 tablet    Refill:  1   cyclobenzaprine (FLEXERIL) 5 MG tablet    Sig: Take 1 tablet (5 mg total) by mouth at bedtime as needed for muscle spasms.    Dispense:  30 tablet    Refill:  1    Follow-up: Return in about 4 months (around 01/18/2021) for HTN.    Lindell Spar, MD

## 2020-09-18 NOTE — Patient Instructions (Signed)
Health Maintenance, Female Adopting a healthy lifestyle and getting preventive care are important in promoting health and wellness. Ask your health care provider about: The right schedule for you to have regular tests and exams. Things you can do on your own to prevent diseases and keep yourself healthy. What should I know about diet, weight, and exercise? Eat a healthy diet  Eat a diet that includes plenty of vegetables, fruits, low-fat dairy products, and lean protein. Do not eat a lot of foods that are high in solid fats, added sugars, or sodium.  Maintain a healthy weight Body mass index (BMI) is used to identify weight problems. It estimates body fat based on height and weight. Your health care provider can help determineyour BMI and help you achieve or maintain a healthy weight. Get regular exercise Get regular exercise. This is one of the most important things you can do for your health. Most adults should: Exercise for at least 150 minutes each week. The exercise should increase your heart rate and make you sweat (moderate-intensity exercise). Do strengthening exercises at least twice a week. This is in addition to the moderate-intensity exercise. Spend less time sitting. Even light physical activity can be beneficial. Watch cholesterol and blood lipids Have your blood tested for lipids and cholesterol at 66 years of age, then havethis test every 5 years. Have your cholesterol levels checked more often if: Your lipid or cholesterol levels are high. You are older than 66 years of age. You are at high risk for heart disease. What should I know about cancer screening? Depending on your health history and family history, you may need to have cancer screening at various ages. This may include screening for: Breast cancer. Cervical cancer. Colorectal cancer. Skin cancer. Lung cancer. What should I know about heart disease, diabetes, and high blood pressure? Blood pressure and heart  disease High blood pressure causes heart disease and increases the risk of stroke. This is more likely to develop in people who have high blood pressure readings, are of African descent, or are overweight. Have your blood pressure checked: Every 3-5 years if you are 18-39 years of age. Every year if you are 40 years old or older. Diabetes Have regular diabetes screenings. This checks your fasting blood sugar level. Have the screening done: Once every three years after age 40 if you are at a normal weight and have a low risk for diabetes. More often and at a younger age if you are overweight or have a high risk for diabetes. What should I know about preventing infection? Hepatitis B If you have a higher risk for hepatitis B, you should be screened for this virus. Talk with your health care provider to find out if you are at risk forhepatitis B infection. Hepatitis C Testing is recommended for: Everyone born from 1945 through 1965. Anyone with known risk factors for hepatitis C. Sexually transmitted infections (STIs) Get screened for STIs, including gonorrhea and chlamydia, if: You are sexually active and are younger than 66 years of age. You are older than 66 years of age and your health care provider tells you that you are at risk for this type of infection. Your sexual activity has changed since you were last screened, and you are at increased risk for chlamydia or gonorrhea. Ask your health care provider if you are at risk. Ask your health care provider about whether you are at high risk for HIV. Your health care provider may recommend a prescription medicine to help   prevent HIV infection. If you choose to take medicine to prevent HIV, you should first get tested for HIV. You should then be tested every 3 months for as long as you are taking the medicine. Pregnancy If you are about to stop having your period (premenopausal) and you may become pregnant, seek counseling before you get  pregnant. Take 400 to 800 micrograms (mcg) of folic acid every day if you become pregnant. Ask for birth control (contraception) if you want to prevent pregnancy. Osteoporosis and menopause Osteoporosis is a disease in which the bones lose minerals and strength with aging. This can result in bone fractures. If you are 65 years old or older, or if you are at risk for osteoporosis and fractures, ask your health care provider if you should: Be screened for bone loss. Take a calcium or vitamin D supplement to lower your risk of fractures. Be given hormone replacement therapy (HRT) to treat symptoms of menopause. Follow these instructions at home: Lifestyle Do not use any products that contain nicotine or tobacco, such as cigarettes, e-cigarettes, and chewing tobacco. If you need help quitting, ask your health care provider. Do not use street drugs. Do not share needles. Ask your health care provider for help if you need support or information about quitting drugs. Alcohol use Do not drink alcohol if: Your health care provider tells you not to drink. You are pregnant, may be pregnant, or are planning to become pregnant. If you drink alcohol: Limit how much you use to 0-1 drink a day. Limit intake if you are breastfeeding. Be aware of how much alcohol is in your drink. In the [place] one drink equals one 12 oz bottle of beer (355 mL), one 5 oz glass of wine (148 mL), or one 1 oz glass of hard liquor (44 mL). General instructions Schedule regular health, dental, and eye exams. Stay current with your vaccines. Tell your health care provider if: You often feel depressed. You have ever been abused or do not feel safe at home. Summary Adopting a healthy lifestyle and getting preventive care are important in promoting health and wellness. Follow your health care provider's instructions about healthy diet, exercising, and getting tested or screened for diseases. Follow your health care provider's  instructions on monitoring your cholesterol and blood pressure. This information is not intended to replace advice given to you by your health care provider. Make sure you discuss any questions you have with your healthcare provider. Document Revised: 01/05/2018 Document Reviewed: 01/05/2018 Elsevier Patient Education  2022 [employer]  

## 2020-09-18 NOTE — Assessment & Plan Note (Signed)
Chronic low back pain radiating to left LE On Gabapentin and PRN Tylenol Added Flexeril PRN for muscle spasms

## 2020-09-29 ENCOUNTER — Other Ambulatory Visit: Payer: Self-pay

## 2020-09-29 ENCOUNTER — Ambulatory Visit (INDEPENDENT_AMBULATORY_CARE_PROVIDER_SITE_OTHER): Payer: Medicare Other | Admitting: *Deleted

## 2020-09-29 DIAGNOSIS — Z0000 Encounter for general adult medical examination without abnormal findings: Secondary | ICD-10-CM | POA: Diagnosis not present

## 2020-09-29 NOTE — Progress Notes (Addendum)
Subjective:   Betty Dawson is a 65 y.o. female who presents for Medicare Annual (Subsequent) preventive examination.  I connected with  Versie Starks on 09/29/20 by an audio enabled telemedicine application and verified that I am speaking with the correct person using two identifiers.   I discussed the limitations, risks, security and privacy concerns of performing an evaluation and management service by telephone and the availability of in person appointments. I also discussed with the patient that there may be a patient responsible charge related to this service. The patient expressed understanding and verbally consented to this telephonic visit.   Review of Systems           Objective:    There were no vitals filed for this visit. There is no height or weight on file to calculate BMI.  Advanced Directives 04/06/2019 03/20/2016 01/30/2016 01/28/2016 01/02/2015 05/29/2014 02/21/2013  Does Patient Have a Medical Advance Directive? No No No No No No Patient does not have advance directive;Patient would not like information  Would patient like information on creating a medical advance directive? No - Patient declined No - Patient declined - No - Patient declined - - -    Current Medications (verified) Outpatient Encounter Medications as of 09/29/2020  Medication Sig   acetaminophen (TYLENOL) 500 MG tablet Take 1,000 mg by mouth every 6 (six) hours as needed (pain).    amLODipine (NORVASC) 5 MG tablet Take 1 tablet (5 mg total) by mouth daily.   cyclobenzaprine (FLEXERIL) 5 MG tablet Take 1 tablet (5 mg total) by mouth at bedtime as needed for muscle spasms.   Ferrous Sulfate Dried 143 (45 Fe) MG TBCR Take 1 tablet by mouth daily.   gabapentin (NEURONTIN) 300 MG capsule Take 1 capsule (300 mg total) by mouth at bedtime.   hydrochlorothiazide (HYDRODIURIL) 25 MG tablet Take 1 tablet (25 mg total) by mouth daily.   ibuprofen (ADVIL,MOTRIN) 800 MG tablet Take 1 tablet (800 mg total) by mouth  every 8 (eight) hours as needed.   No facility-administered encounter medications on file as of 09/29/2020.    Allergies (verified) Lisinopril   History: Past Medical History:  Diagnosis Date   Anemia    Arthritis    OA multiple joints   Heart murmur    Heel spur    Hypertension    Varicose veins    Past Surgical History:  Procedure Laterality Date   ABDOMINAL HYSTERECTOMY     endometriosis   BREAST BIOPSY Right 2017   x2   COLONOSCOPY  02/21/2013   [id number] polyp measuring 6 mm in size was found in the distal/transverse colon; polypectomy was performed using snare cautery/The colon was redundant/The colon mucosa was otherwise normal/Normal mucosa in the terminal ileum   COLONOSCOPY N/A 02/21/2013   Procedure: COLONOSCOPY;  Surgeon: Danie Binder, MD;  Location: [facility];  Service: Endoscopy;  Laterality: N/A;  10:30 AM   FOOT SURGERY Right    heel spur   SHOULDER ARTHROSCOPY WITH ROTATOR CUFF REPAIR Right 01/30/2016   Procedure: SHOULDER ARTHROSCOPY WITH ACROMIOPLASTY;  Surgeon: Carole Civil, MD;  Location: [facility];  Service: Orthopedics;  Laterality: Right;  pt knows to arrive at [place] Right 01/30/2016   Procedure: [place] OPEN;  Surgeon: Carole Civil, MD;  Location: [facility];  Service: Orthopedics;  Laterality: Right;   Family History  Problem Relation Age of Onset   Hypertension Mother  Cancer Mother    Diabetes Sister    Hyperlipidemia Sister    Hypertension Sister    Diabetes Brother    Hypertension Brother    Social History   Socioeconomic History   Marital status: Single    Spouse name: Not on file   Number of children: Not on file   Years of education: Not on file   Highest education level: Not on file  Occupational History   Not on file  Tobacco Use   Smoking status: Never   Smokeless tobacco: Never  Substance and Sexual Activity   Alcohol use: No   Drug use: No   Sexual  activity: Not Currently    Birth control/protection: Surgical  Other Topics Concern   Not on file  Social History Narrative   Not on file   Social Determinants of Health   Financial Resource Strain: Not on file  Food Insecurity: Not on file  Transportation Needs: Not on file  Physical Activity: Not on file  Stress: Not on file  Social Connections: Not on file    Tobacco Counseling Counseling given: Not Answered   Clinical Intake:                 Diabetic? No         Activities of Daily Living In your present state of health, do you have any difficulty performing the following activities: 04/24/2020  Hearing? N  Vision? N  Difficulty concentrating or making decisions? N  Walking or climbing stairs? N  Dressing or bathing? N  Doing errands, shopping? N  Some recent data might be hidden    Patient Care Team: Lindell Spar, MD as PCP - General (Internal Medicine)  Indicate any recent Medical Services you may have received from other than [facility] providers in the past year (date may be approximate).     Assessment:   This is a routine wellness examination for Betty Dawson.  Hearing/Vision screen No results found.  Dietary issues and exercise activities discussed:     Goals Addressed   None   Depression Screen PHQ 2/9 Scores 09/18/2020 04/24/2020 12/22/2017 05/03/2017 10/22/2015 08/08/2013  PHQ - 2 Score 0 0 0 0 0 0  PHQ- 9 Score - - - 0 0 -    Fall Risk Fall Risk  09/18/2020 04/24/2020 12/22/2017 05/03/2017 10/22/2015  Falls in the past year? 0 0 0 No No  Number falls in past yr: 0 0 - - -  Injury with Fall? 0 0 - - -  Risk for fall due to : No Fall Risks No Fall Risks - - -  Follow up Falls evaluation completed Falls evaluation completed Falls evaluation completed - -    FALL RISK PREVENTION PERTAINING TO THE HOME:  Any stairs in or around the home? No  If so, are there any without handrails? No  Home free of loose throw rugs in walkways, pet beds,  electrical cords, etc? Yes  Adequate lighting in your home to reduce risk of falls? Yes   ASSISTIVE DEVICES UTILIZED TO PREVENT FALLS:  Life alert? Yes  Use of a cane, walker or w/c? No  Grab bars in the bathroom? Yes  Shower chair or bench in shower? Yes  Elevated toilet seat or a handicapped toilet? Yes   TIMED UP AND GO:  Was the test performed? No .  Length of time to ambulate 10 feet: NA sec.   Cognitive Function:        Immunizations Immunization History  Administered Date(s) Administered   Fluad Quad(high Dose 65+) 10/18/2019   Influenza,inj,Quad PF,6+ Mos 11/11/2012, 10/22/2015, 10/30/2016, 12/22/2017, 11/28/2018   Pneumococcal Conjugate-13 04/24/2020   Tdap 03/28/2013   Zoster Recombinat (Shingrix) 04/24/2020, 09/18/2020   Zoster, Live 10/22/2015    TDAP status: Up to date  Flu Vaccine status: Due, Education has been provided regarding the importance of this vaccine. Advised may receive this vaccine at local pharmacy or Health Dept. Aware to provide a copy of the vaccination record if obtained from local pharmacy or Health Dept. Verbalized acceptance and understanding.  Pneumococcal vaccine status: Due, Education has been provided regarding the importance of this vaccine. Advised may receive this vaccine at local pharmacy or Health Dept. Aware to provide a copy of the vaccination record if obtained from local pharmacy or Health Dept. Verbalized acceptance and understanding.  Covid-19 vaccine status: Completed vaccines  Qualifies for Shingles Vaccine? Yes   Zostavax completed Yes   Shingrix Completed?: Yes  Screening Tests Health Maintenance  Topic Date Due   INFLUENZA VACCINE  08/26/2020   COVID-19 Vaccine (1) 10/04/2020 (Originally 06/07/1960)   PNA vac Low Risk Adult (2 of 2 - PPSV23) 04/24/2021   MAMMOGRAM  10/11/2021   COLONOSCOPY (Pts 45-79yr Insurance coverage will need to be confirmed)  02/22/2023   TETANUS/TDAP  03/29/2023   DEXA SCAN  Completed    Hepatitis C Screening  Completed   Zoster Vaccines- Shingrix  Completed   HPV VACCINES  Aged Out    Health Maintenance  Health Maintenance Due  Topic Date Due   INFLUENZA VACCINE  08/26/2020    Colorectal cancer screening: Type of screening: Colonoscopy. Completed 02-21-13. Repeat every 10 years  Mammogram status: Completed 10-12-19. Repeat every year  Bone Density status: Completed 05-03-20. Results reflect: Bone density results: NORMAL. Repeat every 5 years.  Lung Cancer Screening: (Low Dose CT Chest recommended if Age 66-80years, 30 pack-year currently smoking OR have quit w/in 15years.) does not qualify.   Lung Cancer Screening Referral: NA  Additional Screening:  Hepatitis C Screening: does qualify; Completed 10-12-15  Vision Screening: Recommended annual ophthalmology exams for early detection of glaucoma and other disorders of the eye. Is the patient up to date with their annual eye exam?  Yes  Who is the provider or what is the name of the office in which the patient attends annual eye exams[place] not [place] with a provider, would they like to be referred to a provider to establish care? No .   Dental Screening: Recommended annual dental exams for proper oral hygiene  Community Resource Referral / Chronic Care Management: CRR required this visit?  No   CCM required this visit?  No      Plan:     I have personally reviewed and noted the following in the patient's chart:   Medical and social history Use of alcohol, tobacco or illicit drugs  Current medications and supplements including opioid prescriptions.  Functional ability and status Nutritional status Physical activity Advanced directives List of other physicians Hospitalizations, surgeries, and ER visits in previous 12 months Vitals Screenings to include cognitive, depression, and falls Referrals and appointments  In addition, I have reviewed and discussed with patient  certain preventive protocols, quality metrics, and best practice recommendations. A written personalized care plan for preventive services as well as general preventive health recommendations were provided to patient.     SShelda Altes CMA   09/29/2020   Nurse Notes: This was  a telehealth visit. The patient was at home. The provider was at home and was Ihor Dow, MD.  ---------------------------------------------------------------------------------------------  I have reviewed and agree with the above AWV documentation.  Ihor Dow, MD [place]

## 2020-09-29 NOTE — Patient Instructions (Signed)
Betty Dawson , Thank you for taking time to come for your Medicare Wellness Visit. I appreciate your ongoing commitment to your health goals. Please review the following plan we discussed and let me know if I can assist you in the future.   Screening recommendations/referrals: Colonoscopy: Due now 02-22-23 Mammogram: Due now  Bone Density: Due 05-03-25 Recommended yearly ophthalmology/optometry visit for glaucoma screening and checkup Recommended yearly dental visit for hygiene and checkup  Vaccinations: Influenza vaccine: Due now  Pneumococcal vaccine: Completed 1st vaccine will discuss 2nd with Patel  Tdap vaccine:Due 03-29-23 Shingles vaccine: Completed    Advanced directives: Information Provided  Conditions/risks identified: Hypertension  Next appointment: 1 year    Preventive Care 64 Years and Older, Female Preventive care refers to lifestyle choices and visits with your health care provider that can promote health and wellness. What does preventive care include? A yearly physical exam. This is also called an annual well check. Dental exams once or twice a year. Routine eye exams. Ask your health care provider how often you should have your eyes checked. Personal lifestyle choices, including: Daily care of your teeth and gums. Regular physical activity. Eating a healthy diet. Avoiding tobacco and drug use. Limiting alcohol use. Practicing safe sex. Taking low-dose aspirin every day. Taking vitamin and mineral supplements as recommended by your health care provider. What happens during an annual well check? The services and screenings done by your health care provider during your annual well check will depend on your age, overall health, lifestyle risk factors, and family history of disease. Counseling  Your health care provider may ask you questions about your: Alcohol use. Tobacco use. Drug use. Emotional well-being. Home and relationship well-being. Sexual  activity. Eating habits. History of falls. Memory and ability to understand (cognition). Work and work Statistician. Reproductive health. Screening  You may have the following tests or measurements: Height, weight, and BMI. Blood pressure. Lipid and cholesterol levels. These may be checked every 5 years, or more frequently if you are over 26 years old. Skin check. Lung cancer screening. You may have this screening every year starting at age 62 if you have a 30-pack-year history of smoking and currently smoke or have quit within the past 15 years. Fecal occult blood test (FOBT) of the stool. You may have this test every year starting at age 48. Flexible sigmoidoscopy or colonoscopy. You may have a sigmoidoscopy every 5 years or a colonoscopy every 10 years starting at age 43. Hepatitis C blood test. Hepatitis B blood test. Sexually transmitted disease (STD) testing. Diabetes screening. This is done by checking your blood sugar (glucose) after you have not eaten for a while (fasting). You may have this done every 1-3 years. Bone density scan. This is done to screen for osteoporosis. You may have this done starting at age 26. Mammogram. This may be done every 1-2 years. Talk to your health care provider about how often you should have regular mammograms. Talk with your health care provider about your test results, treatment options, and if necessary, the need for more tests. Vaccines  Your health care provider may recommend certain vaccines, such as: Influenza vaccine. This is recommended every year. Tetanus, diphtheria, and acellular pertussis (Tdap, Td) vaccine. You may need a Td booster every 10 years. Zoster vaccine. You may need this after age 68. Pneumococcal 13-valent conjugate (PCV13) vaccine. One dose is recommended after age 57. Pneumococcal polysaccharide (PPSV23) vaccine. One dose is recommended after age 52. Talk to your health care  provider about which screenings and vaccines  you need and how often you need them. This information is not intended to replace advice given to you by your health care provider. Make sure you discuss any questions you have with your health care provider. Document Released: 02/08/2015 Document Revised: 10/02/2015 Document Reviewed: 11/13/2014 Elsevier Interactive Patient Education  2017 [place] Prevention in the Home Falls can cause injuries. They can happen to people of all ages. There are many things you can do to make your home safe and to help prevent falls. What can I do on the outside of my home? Regularly fix the edges of walkways and driveways and fix any cracks. Remove anything that might make you trip as you walk through a door, such as a raised step or threshold. Trim any bushes or trees on the path to your home. Use bright outdoor lighting. Clear any walking paths of anything that might make someone trip, such as rocks or tools. Regularly check to see if handrails are loose or broken. Make sure that both sides of any steps have handrails. Any raised decks and porches should have guardrails on the edges. Have any leaves, snow, or ice cleared regularly. Use sand or salt on walking paths during winter. Clean up any spills in your garage right away. This includes oil or grease spills. What can I do in the bathroom? Use night lights. Install grab bars by the toilet and in the tub and shower. Do not use towel bars as grab bars. Use non-skid mats or decals in the tub or shower. If you need to sit down in the shower, use a plastic, non-slip stool. Keep the floor dry. Clean up any water that spills on the floor as soon as it happens. Remove soap buildup in the tub or shower regularly. Attach bath mats securely with double-sided non-slip rug tape. Do not have throw rugs and other things on the floor that can make you trip. What can I do in the bedroom? Use night lights. Make sure that you have a light by your bed that  is easy to reach. Do not use any sheets or blankets that are too big for your bed. They should not hang down onto the floor. Have a firm chair that has side arms. You can use this for support while you get dressed. Do not have throw rugs and other things on the floor that can make you trip. What can I do in the kitchen? Clean up any spills right away. Avoid walking on wet floors. Keep items that you use a lot in easy-to-reach places. If you need to reach something above you, use a strong step stool that has a grab bar. Keep electrical cords out of the way. Do not use floor polish or wax that makes floors slippery. If you must use wax, use non-skid floor wax. Do not have throw rugs and other things on the floor that can make you trip. What can I do with my stairs? Do not leave any items on the stairs. Make sure that there are handrails on both sides of the stairs and use them. Fix handrails that are broken or loose. Make sure that handrails are as long as the stairways. Check any carpeting to make sure that it is firmly attached to the stairs. Fix any carpet that is loose or worn. Avoid having throw rugs at the top or bottom of the stairs. If you do have throw rugs, attach them to the  floor with carpet tape. Make sure that you have a light switch at the top of the stairs and the bottom of the stairs. If you do not have them, ask someone to add them for you. What else can I do to help prevent falls? Wear shoes that: Do not have high heels. Have rubber bottoms. Are comfortable and fit you well. Are closed at the toe. Do not wear sandals. If you use a stepladder: Make sure that it is fully opened. Do not climb a closed stepladder. Make sure that both sides of the stepladder are locked into place. Ask someone to hold it for you, if possible. Clearly mark and make sure that you can see: Any grab bars or handrails. First and last steps. Where the edge of each step is. Use tools that help you  move around (mobility aids) if they are needed. These include: Canes. Walkers. Scooters. Crutches. Turn on the lights when you go into a dark area. Replace any light bulbs as soon as they burn out. Set up your furniture so you have a clear path. Avoid moving your furniture around. If any of your floors are uneven, fix them. If there are any pets around you, be aware of where they are. Review your medicines with your doctor. Some medicines can make you feel dizzy. This can increase your chance of falling. Ask your doctor what other things that you can do to help prevent falls. This information is not intended to replace advice given to you by your health care provider. Make sure you discuss any questions you have with your health care provider. Document Released: 11/08/2008 Document Revised: 06/20/2015 Document Reviewed: 02/16/2014 Elsevier Interactive Patient Education  2017 [employer].

## 2020-10-01 ENCOUNTER — Ambulatory Visit (INDEPENDENT_AMBULATORY_CARE_PROVIDER_SITE_OTHER): Payer: Medicare Other

## 2020-10-01 ENCOUNTER — Other Ambulatory Visit: Payer: Self-pay

## 2020-10-01 DIAGNOSIS — Z23 Encounter for immunization: Secondary | ICD-10-CM

## 2020-11-26 ENCOUNTER — Other Ambulatory Visit: Payer: Self-pay | Admitting: Internal Medicine

## 2020-11-26 DIAGNOSIS — M5136 Other intervertebral disc degeneration, lumbar region: Secondary | ICD-10-CM

## 2021-01-22 ENCOUNTER — Ambulatory Visit (INDEPENDENT_AMBULATORY_CARE_PROVIDER_SITE_OTHER): Payer: Medicare Other | Admitting: Internal Medicine

## 2021-01-22 ENCOUNTER — Other Ambulatory Visit: Payer: Self-pay

## 2021-01-22 ENCOUNTER — Encounter: Payer: Self-pay | Admitting: Internal Medicine

## 2021-01-22 VITALS — BP 118/68 | HR 62 | Resp 18 | Ht 68.0 in | Wt 190.0 lb

## 2021-01-22 DIAGNOSIS — M5136 Other intervertebral disc degeneration, lumbar region: Secondary | ICD-10-CM | POA: Diagnosis not present

## 2021-01-22 DIAGNOSIS — I10 Essential (primary) hypertension: Secondary | ICD-10-CM | POA: Diagnosis not present

## 2021-01-22 DIAGNOSIS — M159 Polyosteoarthritis, unspecified: Secondary | ICD-10-CM | POA: Diagnosis not present

## 2021-01-22 DIAGNOSIS — D508 Other iron deficiency anemias: Secondary | ICD-10-CM | POA: Diagnosis not present

## 2021-01-22 MED ORDER — CYCLOBENZAPRINE HCL 10 MG PO TABS
10.0000 mg | ORAL_TABLET | Freq: Two times a day (BID) | ORAL | 1 refills | Status: DC | PRN
Start: 2021-01-22 — End: 2021-02-25

## 2021-01-22 NOTE — Progress Notes (Signed)
Established Patient Office Visit  Subjective:  Patient ID: Betty Dawson, female    DOB: [birth year]  Age: 65 y.o. MRN: [id number]  CC:  Chief Complaint  Patient presents with   Follow-up    4 month follow up has been having trouble out of arthritis all over     HPI Betty Dawson is a 65 y.o. female with past medical history of of HTN, OA, DDD of lumbar spine and iron deficiency anemia who presents for f/u of her chronic medical conditions.  HTN: BP is well-controlled. Takes medications regularly. Patient denies headache, dizziness, chest pain, dyspnea or palpitations.  Chronic low back pain: She complains of chronic, intermittent low back pain, radiating to b/l LE with numbness of left foot, worse with movement and walking and better with rest.  She denies any saddle anesthesia, urinary or stool incontinence currently.  She has done physical therapy in the past, which did not improve her pain.  She also complains of chronic bilateral knee and ankle pain, for which she takes Tylenol as needed.  Denies any recent fall or injury.  Past Medical History:  Diagnosis Date   Anemia    Arthritis    OA multiple joints   Heart murmur    Heel spur    Hypertension    Varicose veins     Past Surgical History:  Procedure Laterality Date   ABDOMINAL HYSTERECTOMY     endometriosis   BREAST BIOPSY Right 2017   x2   COLONOSCOPY  02/21/2013   AES:LPNPYY polyp measuring 6 mm in size was found in the distal/transverse colon; polypectomy was performed using snare cautery/The colon was redundant/The colon mucosa was otherwise normal/Normal mucosa in the terminal ileum   COLONOSCOPY N/A 02/21/2013   Procedure: COLONOSCOPY;  Surgeon: Danie Binder, MD;  Location: [facility];  Service: Endoscopy;  Laterality: N/A;  10:30 AM   FOOT SURGERY Right    heel spur   SHOULDER ARTHROSCOPY WITH ROTATOR CUFF REPAIR Right 01/30/2016   Procedure: SHOULDER ARTHROSCOPY WITH ACROMIOPLASTY;  Surgeon: Carole Civil, MD;  Location: [facility];  Service: Orthopedics;  Laterality: Right;  pt knows to arrive at [place] Right 01/30/2016   Procedure: [place] OPEN;  Surgeon: Carole Civil, MD;  Location: [facility];  Service: Orthopedics;  Laterality: Right;    Family History  Problem Relation Age of Onset   Hypertension Mother    Cancer Mother    Diabetes Sister    Hyperlipidemia Sister    Hypertension Sister    Diabetes Brother    Hypertension Brother     Social History   Socioeconomic History   Marital status: Single    Spouse name: Not on file   Number of children: Not on file   Years of education: Not on file   Highest education level: Not on file  Occupational History   Not on file  Tobacco Use   Smoking status: Never   Smokeless tobacco: Never  Substance and Sexual Activity   Alcohol use: No   Drug use: No   Sexual activity: Not Currently    Birth control/protection: Surgical  Other Topics Concern   Not on file  Social History Narrative   Not on file   Social Determinants of Health   Financial Resource Strain: Low Risk    Difficulty of Paying Living Expenses: Not hard at all  Food Insecurity: No Food Insecurity  Worried About Charity fundraiser in the Last Year: Never true   [place] in the Last Year: Never true  Transportation Needs: No Transportation Needs   Lack of Transportation (Medical): No   Lack of Transportation (Non-Medical): No  Physical Activity: Insufficiently Active   Days of Exercise per Week: 3 days   Minutes of Exercise per Session: 30 min  Stress: No Stress Concern Present   Feeling of Stress : Not at all  Social Connections: Moderately Isolated   Frequency of Communication with Friends and Family: More than three times a week   Frequency of Social Gatherings with Friends and Family: More than three times a week   Attends Religious Services: 1 to 4 times per year   Active Member of  [employer] or Organizations: No   Attends Archivist Meetings: Never   Marital Status: Widowed  Human resources officer Violence: Not At Risk   Fear of Current or Ex-Partner: No   Emotionally Abused: No   Physically Abused: No   Sexually Abused: No    Outpatient Medications Prior to Visit  Medication Sig Dispense Refill   acetaminophen (TYLENOL) 500 MG tablet Take 1,000 mg by mouth every 6 (six) hours as needed (pain).      amLODipine (NORVASC) 5 MG tablet Take 1 tablet (5 mg total) by mouth daily. 90 tablet 1   Ferrous Sulfate Dried 143 (45 Fe) MG TBCR Take 1 tablet by mouth daily. 30 tablet 3   gabapentin (NEURONTIN) 300 MG capsule Take 1 capsule (300 mg total) by mouth at bedtime. 30 capsule 5   hydrochlorothiazide (HYDRODIURIL) 25 MG tablet Take 1 tablet (25 mg total) by mouth daily. 90 tablet 1   ibuprofen (ADVIL,MOTRIN) 800 MG tablet Take 1 tablet (800 mg total) by mouth every 8 (eight) hours as needed. 90 tablet 5   cyclobenzaprine (FLEXERIL) 5 MG tablet TAKE 1 TABLET BY MOUTH AT BEDTIME AS NEEDED FOR MUSCLE SPASMS. 30 tablet 1   No facility-administered medications prior to visit.    Allergies  Allergen Reactions   Lisinopril Swelling    ROS Review of Systems  Constitutional:  Negative for chills and fever.  HENT:  Negative for congestion, sinus pressure, sinus pain and sore throat.   Eyes:  Negative for pain and discharge.  Respiratory:  Negative for cough and shortness of breath.   Cardiovascular:  Negative for chest pain and palpitations.  Gastrointestinal:  Negative for abdominal pain, constipation, diarrhea, nausea and vomiting.  Endocrine: Negative for polydipsia and polyuria.  Genitourinary:  Negative for dysuria and hematuria.  Musculoskeletal:  Positive for arthralgias, back pain and neck pain. Negative for neck stiffness.  Skin:  Negative for rash.  Neurological:  Negative for dizziness and weakness.  Psychiatric/Behavioral:  Negative for agitation and  behavioral problems.      Objective:    Physical Exam Vitals reviewed.  Constitutional:      General: She is not in acute distress.    Appearance: She is not diaphoretic.  HENT:     Head: Normocephalic and atraumatic.     Nose: Nose normal.     Mouth/Throat:     Mouth: Mucous membranes are moist.  Eyes:     General: No scleral icterus.    Extraocular Movements: Extraocular movements intact.  Neck:     Vascular: No carotid bruit.  Cardiovascular:     Rate and Rhythm: Normal rate and regular rhythm.     Pulses: Normal pulses.  Heart sounds: Normal heart sounds. No murmur heard. Pulmonary:     Breath sounds: Normal breath sounds. No wheezing or rales.  Abdominal:     Palpations: Abdomen is soft.     Tenderness: There is no abdominal tenderness.  Musculoskeletal:        General: Tenderness (Paraspinal around lumbar spine area) present.     Cervical back: Neck supple. No tenderness.     Right lower leg: No edema.     Left lower leg: No edema.  Skin:    General: Skin is warm.     Findings: No rash.  Neurological:     General: No focal deficit present.     Mental Status: She is alert and oriented to person, place, and time.     Cranial Nerves: No cranial nerve deficit.     Sensory: No sensory deficit.     Motor: No weakness.  Psychiatric:        Mood and Affect: Mood normal.        Behavior: Behavior normal.    BP 118/68 (BP Location: Left Arm, Patient Position: Sitting, Cuff Size: Normal)    Pulse 62    Resp 18    Ht '5\' 8"'  (1.727 m)    Wt 190 lb 0.6 oz (86.2 kg)    SpO2 100%    BMI 28.90 kg/m  Wt Readings from Last 3 Encounters:  01/22/21 190 lb 0.6 oz (86.2 kg)  09/18/20 193 lb (87.5 kg)  04/24/20 196 lb 1.9 oz (89 kg)    Lab Results  Component Value Date   TSH 2.320 07/15/2020   Lab Results  Component Value Date   WBC 6.3 07/15/2020   HGB 11.9 07/15/2020   HCT 36.9 07/15/2020   MCV 85 07/15/2020   PLT 290 07/15/2020   Lab Results  Component Value  Date   NA 137 07/15/2020   K 4.3 07/15/2020   CO2 29 07/15/2020   GLUCOSE 106 (H) 07/15/2020   BUN 13 07/15/2020   CREATININE 0.74 07/15/2020   BILITOT 0.2 07/15/2020   ALKPHOS 115 07/15/2020   AST 13 07/15/2020   ALT 10 07/15/2020   PROT 7.5 07/15/2020   ALBUMIN 4.3 07/15/2020   CALCIUM 9.6 07/15/2020   ANIONGAP 7 01/28/2016   EGFR 89 07/15/2020   Lab Results  Component Value Date   CHOL 155 07/15/2020   Lab Results  Component Value Date   HDL 65 07/15/2020   Lab Results  Component Value Date   LDLCALC 80 07/15/2020   Lab Results  Component Value Date   TRIG 46 07/15/2020   Lab Results  Component Value Date   CHOLHDL 2.4 07/15/2020   Lab Results  Component Value Date   HGBA1C 5.5 07/15/2020      Assessment & Plan:   Problem List Items Addressed This Visit       Cardiovascular and Mediastinum   HTN (hypertension) - Primary    BP Readings from Last 1 Encounters:  01/22/21 118/68  Well-controlled with Amlodipine and HCTZ Counseled for compliance with the medications Advised DASH diet and moderate exercise/walking, at least 150 mins/week      Relevant Orders   Basic Metabolic Panel (BMET)     Musculoskeletal and Integument   OA (osteoarthritis)    Knee and ankles involvement Tylenol PRN      Relevant Medications   cyclobenzaprine (FLEXERIL) 10 MG tablet   DDD (degenerative disc disease), lumbar    Chronic low back pain radiating to  left LE, has intermittent numbness of left feet concerning for lumbar spinal stenosis, has had PT in the past with no benefit - will get MRI of lumbar spine On Gabapentin and PRN Tylenol Flexeril 10 mg PRN for muscle spasms      Relevant Medications   cyclobenzaprine (FLEXERIL) 10 MG tablet   Other Relevant Orders   MR Lumbar Spine Wo Contrast     Other   Anemia, iron deficiency    On iron supplements Check CBC      Relevant Orders   CBC    Meds ordered this encounter  Medications   cyclobenzaprine  (FLEXERIL) 10 MG tablet    Sig: Take 1 tablet (10 mg total) by mouth 2 (two) times daily as needed for muscle spasms.    Dispense:  30 tablet    Refill:  1    Follow-up: Return in about 4 months (around 05/23/2021) for HTN.    Lindell Spar, MD

## 2021-01-22 NOTE — Assessment & Plan Note (Signed)
Knee and ankles involvement Tylenol PRN

## 2021-01-22 NOTE — Assessment & Plan Note (Signed)
BP Readings from Last 1 Encounters:  01/22/21 118/68   Well-controlled with Amlodipine and HCTZ Counseled for compliance with the medications Advised DASH diet and moderate exercise/walking, at least 150 mins/week

## 2021-01-22 NOTE — Patient Instructions (Signed)
Please continue taking medications as prescribed.  Please perform simple back exercises and use back brace for stabilization for back.  Avoid heavy lifting and frequent bending.

## 2021-01-22 NOTE — Assessment & Plan Note (Addendum)
Chronic low back pain radiating to left LE, has intermittent numbness of left feet concerning for lumbar spinal stenosis, has had PT in the past with no benefit - will get MRI of lumbar spine On Gabapentin and PRN Tylenol Flexeril 10 mg PRN for muscle spasms

## 2021-01-22 NOTE — Assessment & Plan Note (Signed)
On iron supplements Check CBC

## 2021-01-23 LAB — CBC
Hematocrit: 35.8 % (ref 34.0–46.6)
Hemoglobin: 11.9 g/dL (ref 11.1–15.9)
MCH: 27.5 pg (ref 26.6–33.0)
MCHC: 33.2 g/dL (ref 31.5–35.7)
MCV: 83 fL (ref 79–97)
Platelets: 321 10*3/uL (ref 150–450)
RBC: 4.32 x10E6/uL (ref 3.77–5.28)
RDW: 11.9 % (ref 11.7–15.4)
WBC: 5.7 10*3/uL (ref 3.4–10.8)

## 2021-01-23 LAB — BASIC METABOLIC PANEL
BUN/Creatinine Ratio: 22 (ref 12–28)
BUN: 13 mg/dL (ref 8–27)
CO2: 32 mmol/L — ABNORMAL HIGH (ref 20–29)
Calcium: 9.9 mg/dL (ref 8.7–10.3)
Chloride: 95 mmol/L — ABNORMAL LOW (ref 96–106)
Creatinine, Ser: 0.58 mg/dL (ref 0.57–1.00)
Glucose: 101 mg/dL — ABNORMAL HIGH (ref 70–99)
Potassium: 3.8 mmol/L (ref 3.5–5.2)
Sodium: 139 mmol/L (ref 134–144)
eGFR: 100 mL/min/{1.73_m2} (ref >59)

## 2021-02-20 ENCOUNTER — Ambulatory Visit (HOSPITAL_COMMUNITY): Payer: Medicare Other

## 2021-02-25 ENCOUNTER — Other Ambulatory Visit: Payer: Self-pay | Admitting: Internal Medicine

## 2021-02-25 ENCOUNTER — Telehealth: Payer: Self-pay

## 2021-02-25 DIAGNOSIS — M5136 Other intervertebral disc degeneration, lumbar region: Secondary | ICD-10-CM

## 2021-02-25 NOTE — Telephone Encounter (Signed)
Tillie Rung called for Betty Dawson Pre Service on a prior authorization patient scheduled for 02.02.2023. please have nurse return call at [phone] ext [phone].

## 2021-02-26 NOTE — Telephone Encounter (Signed)
Approval received auth number [id number] LVM for kendra with info

## 2021-02-27 ENCOUNTER — Ambulatory Visit (HOSPITAL_COMMUNITY): Payer: Medicare HMO

## 2021-03-04 ENCOUNTER — Other Ambulatory Visit: Payer: Self-pay | Admitting: Internal Medicine

## 2021-03-07 ENCOUNTER — Other Ambulatory Visit: Payer: Self-pay

## 2021-03-07 ENCOUNTER — Ambulatory Visit (HOSPITAL_COMMUNITY)
Admission: RE | Admit: 2021-03-07 | Discharge: 2021-03-07 | Disposition: A | Discharge status: Home / Self Care | Payer: Medicare HMO | Source: Ambulatory Visit | Attending: Internal Medicine | Admitting: Internal Medicine

## 2021-03-07 DIAGNOSIS — M5116 Intervertebral disc disorders with radiculopathy, lumbar region: Secondary | ICD-10-CM | POA: Diagnosis not present

## 2021-03-07 DIAGNOSIS — M5136 Other intervertebral disc degeneration, lumbar region: Secondary | ICD-10-CM | POA: Diagnosis not present

## 2021-03-11 ENCOUNTER — Other Ambulatory Visit: Payer: Self-pay | Admitting: *Deleted

## 2021-03-11 ENCOUNTER — Telehealth: Payer: Self-pay

## 2021-03-11 DIAGNOSIS — M5136 Other intervertebral disc degeneration, lumbar region: Secondary | ICD-10-CM

## 2021-03-11 NOTE — Telephone Encounter (Signed)
Patient left voicemail returning nurse call. Call back # [phone]

## 2021-03-11 NOTE — Telephone Encounter (Signed)
Pt advised with verbal understanding  °

## 2021-03-18 DIAGNOSIS — H524 Presbyopia: Secondary | ICD-10-CM | POA: Diagnosis not present

## 2021-03-19 DIAGNOSIS — M4316 Spondylolisthesis, lumbar region: Secondary | ICD-10-CM | POA: Diagnosis not present

## 2021-03-20 ENCOUNTER — Other Ambulatory Visit: Payer: Self-pay | Admitting: Internal Medicine

## 2021-03-20 DIAGNOSIS — I10 Essential (primary) hypertension: Secondary | ICD-10-CM

## 2021-04-06 ENCOUNTER — Other Ambulatory Visit: Payer: Self-pay | Admitting: Internal Medicine

## 2021-04-06 DIAGNOSIS — M5136 Other intervertebral disc degeneration, lumbar region: Secondary | ICD-10-CM

## 2021-04-14 DIAGNOSIS — M48062 Spinal stenosis, lumbar region with neurogenic claudication: Secondary | ICD-10-CM | POA: Diagnosis not present

## 2021-04-16 ENCOUNTER — Other Ambulatory Visit: Payer: Self-pay

## 2021-04-16 ENCOUNTER — Encounter (HOSPITAL_COMMUNITY): Payer: Self-pay | Admitting: Physical Therapy

## 2021-04-16 ENCOUNTER — Ambulatory Visit (HOSPITAL_COMMUNITY): Payer: Medicare HMO | Attending: Neurosurgery | Admitting: Physical Therapy

## 2021-04-16 DIAGNOSIS — M545 Low back pain, unspecified: Secondary | ICD-10-CM | POA: Insufficient documentation

## 2021-04-16 DIAGNOSIS — M48061 Spinal stenosis, lumbar region without neurogenic claudication: Secondary | ICD-10-CM | POA: Insufficient documentation

## 2021-04-16 DIAGNOSIS — R2689 Other abnormalities of gait and mobility: Secondary | ICD-10-CM | POA: Diagnosis not present

## 2021-04-16 NOTE — Therapy (Signed)
?OUTPATIENT PHYSICAL THERAPY THORACOLUMBAR EVALUATION ? ? ?Patient Name: Betty Dawson ?MRN: [id number] ?DOB:[birth year], 66 y.o., female ?Today's Date: 04/16/2021 ? ? PT End of Session - 04/16/21 1108   ? ? Visit Number 1   ? Number of Visits 12   ? Date for PT Re-Evaluation 05/28/21   ? Authorization Type [employer] Medicare (check auth)   ? Authorization - Visit Number 1   ? Authorization - Number of Visits 1   ? Progress Note Due on Visit 10   ? PT Start Time 1035   ? PT Stop Time 1115   ? PT Time Calculation (min) 40 min   ? Activity Tolerance Patient tolerated treatment well   ? Behavior During Therapy [facility] for tasks assessed/performed   ? ?  ?  ? ?  ? ? ?Past Medical History:  ?Diagnosis Date  ? Anemia   ? Arthritis   ? OA multiple joints  ? Heart murmur   ? Heel spur   ? Hypertension   ? Varicose veins   ? ?Past Surgical History:  ?Procedure Laterality Date  ? ABDOMINAL HYSTERECTOMY    ? endometriosis  ? BREAST BIOPSY Right 2017  ? x2  ? COLONOSCOPY  02/21/2013  ? OAC:[id number] polyp measuring 6 mm in size was found in the distal/transverse colon; polypectomy was performed using snare cautery/The colon was redundant/The colon mucosa was otherwise normal/Normal mucosa in the terminal ileum  ? COLONOSCOPY N/A 02/21/2013  ? Procedure: COLONOSCOPY;  Surgeon: Danie Binder, MD;  [facility];  Service: Endoscopy;  Laterality: N/A;  10:30 AM  ? FOOT SURGERY Right   ? heel spur  ? SHOULDER ARTHROSCOPY WITH ROTATOR CUFF REPAIR Right 01/30/2016  ? Procedure: SHOULDER ARTHROSCOPY WITH ACROMIOPLASTY;  Surgeon: Carole Civil, MD;  Location: [facility];  Service: Orthopedics;  Laterality: Right;  pt knows to arrive at 7:15  ? SHOULDER OPEN ROTATOR CUFF REPAIR Right 01/30/2016  ? Procedure: ROTATOR CUFF REPAIR SHOULDER OPEN;  Surgeon: Carole Civil, MD;  Location: [facility];  Service: Orthopedics;  Laterality: Right;  ? ?Patient Active Problem List  ? Diagnosis Date Noted  ? Encounter for general adult medical  examination with abnormal findings 09/18/2020  ? Neck pain 09/18/2020  ? DDD (degenerative disc disease), lumbar 05/05/2019  ? Anemia, iron deficiency 10/30/2016  ? Complete tear of right rotator cuff   ? Subacromial impingement of right shoulder   ? Ankle pain, chronic 06/02/2011  ? HTN (hypertension) 05/20/2011  ? OA (osteoarthritis) 05/20/2011  ? Varicose veins 05/20/2011  ? ? ?PCP: Lindell Spar, MD ? ?REFERRING PROVIDER: Vallarie Mare, MD ? ?REFERRING DIAG: M43.16 Spondylosithesis  ? ?THERAPY DIAG:  ?Low back pain, unspecified back pain laterality, unspecified chronicity, unspecified whether sciatica present ? ?ONSET DATE: Chronic ? ?SUBJECTIVE:                                                                                                                                                                                          ? ?  SUBJECTIVE STATEMENT: ?Patient presents to therapy with complaint of chronic LBP. She has history of degeneration and stenosis, as evidence by imaging. She had therapy several years ago. She was supposed to have back surgery but never did. She is trying to avoid this if possible. She takes prescription meds for pain.  ?PERTINENT HISTORY:  ?Stenosis, anterolisthesis  ? ?PAIN:  ?Are you having pain? Yes: NPRS scale: 8/10 ?Pain location: low back ?Pain description: dull aching ?Aggravating factors: prolonged sitting/ standing, walking  ?Relieving factors: Hot tub/ bath, meds  ? ? ?PRECAUTIONS: None ? ?WEIGHT BEARING RESTRICTIONS No ? ?FALLS:  ?Has patient fallen in last 6 months? No, Number of falls:  ? ?LIVING ENVIRONMENT: ?Lives with: lives alone ?Lives in: House/apartment ? ?OCCUPATION: Retired ? ?PLOF: Independent ? ?PATIENT GOALS "Feel better"  ? ? ?OBJECTIVE:  ? ?DIAGNOSTIC FINDINGS:  ?IMPRESSION: ?1. Severe L3-4 facet arthrosis with grade 1 anterolisthesis and mild ?spinal, lateral recess, and neural foraminal stenosis. ?2. Mild lateral recess and neural foraminal stenosis  at L4-5. ?3. Mild left neural foraminal stenosis at L5-S1. ? ?PATIENT SURVEYS:  ?FOTO 40% function ? ? ? ?COGNITION: ? Overall cognitive status: Within functional limits for tasks assessed   ?  ?POSTURE:  ?Increased lordosis (lumbar)  ? ?PALPATION: ?No notable tenderness in hips or lumbar paraspinals  ? ?LUMBAR ROM:  ? ?Active  A/PROM  ?04/16/2021  ?Flexion WNL  ?Extension 80% limited  ?Right lateral flexion 50% limited  ?Left lateral flexion 50% limited  ?Right rotation   ?Left rotation   ? (Blank rows = not tested) ? ? ? ?LE MMT: ? ?MMT Right ?04/16/2021 Left ?04/16/2021  ?Hip flexion 4+ 4+  ?Hip extension 3- 3-  ?Hip abduction 3 3-  ?Hip adduction    ?Hip internal rotation    ?Hip external rotation    ?Knee flexion    ?Knee extension 4 4  ?Ankle dorsiflexion 4 4  ?Ankle plantarflexion    ?Ankle inversion    ?Ankle eversion    ? (Blank rows = not tested) ? ? ? ? ?TODAY'S TREATMENT  ?04/16/21 ?Ab brace 5 x 5"  ?Bridge (partial range) x 5 ? ? ?PATIENT EDUCATION:  ?Education details: on evaluation findings, POC and HEP  ?Person educated: Patient ?Education method: Explanation and Demonstration ?Education comprehension: verbalized understanding and returned demonstration ? ? ?HOME EXERCISE PROGRAM: ?Access Code: [email or web address] ?URL: [email or web address] ?Date: 04/16/2021 ?Prepared by: Josue Hector ? ?Exercises ?Supine Bridge - 2-3 x daily - 7 x weekly - 1-2 sets - 10 reps - 5 second hold ?Supine Transversus Abdominis Bracing - Hands on Stomach - 2-3 x daily - 7 x weekly - 1-2 sets - 10 reps - 5 second hold ? ? ?ASSESSMENT: ? ?CLINICAL IMPRESSION: ?Patient is a 66 y.o. female who presents to physical therapy with complaint of LBP. Patient demonstrates muscle weakness, reduced ROM, and fascial restrictions which are likely contributing to symptoms of pain and are negatively impacting patient ability to perform ADLs and functional mobility tasks. Patient will benefit from skilled physical therapy services to  address these deficits to reduce pain and improve level of function with ADLs and functional mobility tasks. ? ? ?OBJECTIVE IMPAIRMENTS Abnormal gait, decreased activity tolerance, decreased mobility, difficulty walking, decreased ROM, decreased strength, increased fascial restrictions, increased muscle spasms, impaired flexibility, improper body mechanics, and pain.  ? ?ACTIVITY LIMITATIONS cleaning, community activity, driving, meal prep, laundry, yard work, shopping, and yard work.  ? ?PERSONAL FACTORS Past/current experiences are also affecting patient's functional outcome.  ? ? ?  REHAB POTENTIAL: Good ? ?CLINICAL DECISION MAKING: Stable/uncomplicated ? ?EVALUATION COMPLEXITY: Low ? ? ?GOALS: ? ?SHORT TERM GOALS: Target date: 04/30/2021 ? ?Patient will be independent with initial HEP and self-management strategies to improve functional outcomes ?Baseline:  ?Goal status: INITIAL  ? ? ?LONG TERM GOALS: Target date: 05/28/2021 ? ?Patient will be independent with advanced HEP and self-management strategies to improve functional outcomes ?Baseline:  ?Goal status: INITIAL ? ?2.  Patient will improve FOTO score to predicted value to indicate improvement in functional outcomes ?Baseline:  ?Goal status: INITIAL ? ?3.  Patient will report reduction of back pain to <3/10 for improved quality of life and ability to perform ADLs  ?Baseline:  ?Goal status: INITIAL ? ?4. Patient will have equal to or > 4+/5 MMT throughout BLE to improve ability to perform functional mobility, stair ambulation and ADLs.  ?Baseline:  ?Goal status: INITIAL  ? ?PLAN: ?PT FREQUENCY: 2x/week ? ?PT DURATION: 6 weeks ? ?PLANNED INTERVENTIONS: Therapeutic exercises, Therapeutic activity, Neuromuscular re-education, Balance training, Gait training, Patient/Family education, Joint manipulation, Joint mobilization, Stair training, Aquatic Therapy, Dry Needling, Electrical stimulation, Spinal manipulation, Spinal mobilization, Cryotherapy, Moist heat, scar  mobilization, Taping, Traction, Ultrasound, Biofeedback, Ionotophoresis '4mg'$ /ml Dexamethasone, and Manual therapy. ? ? ?PLAN FOR NEXT SESSION: Review HEP. Progress hip and core strength as tolerated. Progress lumbar mobi

## 2021-04-23 ENCOUNTER — Ambulatory Visit (HOSPITAL_COMMUNITY): Payer: Medicare HMO | Admitting: Physical Therapy

## 2021-04-23 DIAGNOSIS — M545 Low back pain, unspecified: Secondary | ICD-10-CM | POA: Diagnosis not present

## 2021-04-23 DIAGNOSIS — M48061 Spinal stenosis, lumbar region without neurogenic claudication: Secondary | ICD-10-CM | POA: Diagnosis not present

## 2021-04-23 DIAGNOSIS — R2689 Other abnormalities of gait and mobility: Secondary | ICD-10-CM | POA: Diagnosis not present

## 2021-04-23 NOTE — Therapy (Addendum)
?OUTPATIENT PHYSICAL THERAPY TREATMENT NOTE ? ? ?Patient Name: Betty Dawson ?MRN: [id number] ?DOB:Jun 22, 1955, 66 y.o., female ?Today's Date: 04/23/2021 ? ?PCP: Lindell Spar, MD ?REFERRING PROVIDER: Lindell Spar, MD ? ? PT End of Session - 04/23/21 1322   ? ? Visit Number 2   ? Number of Visits 12   ? Date for PT Re-Evaluation 05/28/21   ? Authorization Type [employer] ; cohere approved 12 visits 3/22-5/13   ? Authorization - Visit Number 2   ? Authorization - Number of Visits 12   ? Progress Note Due on Visit 10   ? PT Start Time 1314   ? PT Stop Time 1355   ? PT Time Calculation (min) 41 min   ? Activity Tolerance Patient tolerated treatment well   ? Behavior During Therapy [facility] for tasks assessed/performed   ? ?  ?  ? ?  ? ? ?Past Medical History:  ?Diagnosis Date  ? Anemia   ? Arthritis   ? OA multiple joints  ? Heart murmur   ? Heel spur   ? Hypertension   ? Varicose veins   ? ?Past Surgical History:  ?Procedure Laterality Date  ? ABDOMINAL HYSTERECTOMY    ? endometriosis  ? BREAST BIOPSY Right 2017  ? x2  ? COLONOSCOPY  02/21/2013  ? CLE:[id number] polyp measuring 6 mm in size was found in the distal/transverse colon; polypectomy was performed using snare cautery/The colon was redundant/The colon mucosa was otherwise normal/Normal mucosa in the terminal ileum  ? COLONOSCOPY N/A 02/21/2013  ? Procedure: COLONOSCOPY;  Surgeon: Danie Binder, MD;  [facility];  Service: Endoscopy;  Laterality: N/A;  10:30 AM  ? FOOT SURGERY Right   ? heel spur  ? SHOULDER ARTHROSCOPY WITH ROTATOR CUFF REPAIR Right 01/30/2016  ? Procedure: SHOULDER ARTHROSCOPY WITH ACROMIOPLASTY;  Surgeon: Carole Civil, MD;  Location: [facility];  Service: Orthopedics;  Laterality: Right;  pt knows to arrive at 7:15  ? SHOULDER OPEN ROTATOR CUFF REPAIR Right 01/30/2016  ? Procedure: ROTATOR CUFF REPAIR SHOULDER OPEN;  Surgeon: Carole Civil, MD;  Location: [facility];  Service: Orthopedics;  Laterality: Right;  ? ?Patient Active  Problem List  ? Diagnosis Date Noted  ? Encounter for general adult medical examination with abnormal findings 09/18/2020  ? Neck pain 09/18/2020  ? DDD (degenerative disc disease), lumbar 05/05/2019  ? Anemia, iron deficiency 10/30/2016  ? Complete tear of right rotator cuff   ? Subacromial impingement of right shoulder   ? Ankle pain, chronic 06/02/2011  ? HTN (hypertension) 05/20/2011  ? OA (osteoarthritis) 05/20/2011  ? Varicose veins 05/20/2011  ? ? ?REFERRING DIAG: Spondylosothesis ? ?THERAPY DIAG:  ?Low back pain, unspecified back pain laterality, unspecified chronicity, unspecified whether sciatica present ? ?PERTINENT HISTORY: stenosis, anterolisthesis ? ?PRECAUTIONS: none ? ?SUBJECTIVE: Pt states she had some pain this morning but currently without any pain.  States she did take some pain meds this morning and it helped (gabapentin).  Reports compliance with HEP. ? ?PAIN:  ?Are you having pain? No ? ?OBJECTIVE:  ?  ?DIAGNOSTIC FINDINGS:  ?IMPRESSION: ?1. Severe L3-4 facet arthrosis with grade 1 anterolisthesis and mild ?spinal, lateral recess, and neural foraminal stenosis. ?2. Mild lateral recess and neural foraminal stenosis at L4-5. ?3. Mild left neural foraminal stenosis at L5-S1. ?  ?  ?  ?  ?TODAY'S TREATMENT  ?04/23/21 ?Abdominal bracing 10X5" ?Bridge 10X (partial range 2X5) ?SLR 5X each LE ?Trunk rotations 5X10" each  way. ?Sidelying clams 10X5" each LE ?sidelying hip abduction 10X each LE ?Long sitting hamstring stretch 3X20" each LE ?Hip excursions 5X each direction ? ?04/16/21 ?Ab brace 5 x 5"  ?Bridge (partial range) x 5 ?  ?  ?PATIENT EDUCATION:  ?Education details: on evaluation findings, POC and HEP  ?Person educated: Patient ?Education method: Explanation and Demonstration ?Education comprehension: verbalized understanding and returned demonstration ?  ?  ?HOME EXERCISE PROGRAM: ?Access Code: [email or web address] ?URL: [email or web address] ?Date: 04/16/2021 ?Prepared by: Josue Hector ?   ?Exercises ?Supine Bridge - 2-3 x daily - 7 x weekly - 1-2 sets - 10 reps - 5 second hold ?Supine Transversus Abdominis Bracing - Hands on Stomach - 2-3 x daily - 7 x weekly - 1-2 sets - 10 reps - 5 second hold ?  ?3/29:  Access Code: [email or web address] ?Prepared by: Roseanne Reno  long sitting hamstring stretch, SLR, sidelying hip abduction and sidelying clams ?  ?ASSESSMENT: ?  ?CLINICAL IMPRESSION: ?Reviewed goals, HEP and POC moving forward.  PT with breathing cues with abdominal bracing.  Patient will benefit from ongoing skilled physical therapy services to reduce pain and improve level of function with ADLs and functional mobility tasks. ?  ?OBJECTIVE IMPAIRMENTS Abnormal gait, decreased activity tolerance, decreased mobility, difficulty walking, decreased ROM, decreased strength, increased fascial restrictions, increased muscle spasms, impaired flexibility, improper body mechanics, and pain.  ?  ?ACTIVITY LIMITATIONS cleaning, community activity, driving, meal prep, laundry, yard work, shopping, and yard work.  ?  ?PERSONAL FACTORS Past/current experiences are also affecting patient's functional outcome.  ?  ?  ?REHAB POTENTIAL: Good ?  ?CLINICAL DECISION MAKING: Stable/uncomplicated ?  ?EVALUATION COMPLEXITY: Low ?  ?  ?GOALS: ?  ?SHORT TERM GOALS: Target date: 04/30/2021 ?  ?Patient will be independent with initial HEP and self-management strategies to improve functional outcomes ?Baseline:  ?Goal status: ongoing  ?  ?  ?LONG TERM GOALS: Target date: 05/28/2021 ?  ?Patient will be independent with advanced HEP and self-management strategies to improve functional outcomes ?Baseline:  ?Goal status: ongoing ?  ?2.  Patient will improve FOTO score to predicted value to indicate improvement in functional outcomes ?Baseline:  ?Goal status: ongoing ?  ?3.  Patient will report reduction of back pain to <3/10 for improved quality of life and ability to perform ADLs  ?Baseline:  ?Goal  status: ongoing ?  ?4. Patient will have equal to or > 4+/5 MMT throughout BLE to improve ability to perform functional mobility, stair ambulation and ADLs.  ?Baseline:  ?Goal status: ongoing ?  ?PLAN: ?PT FREQUENCY: 2x/week ?  ?PT DURATION: 6 weeks ?  ?PLANNED INTERVENTIONS: Therapeutic exercises, Therapeutic activity, Neuromuscular re-education, Balance training, Gait training, Patient/Family education, Joint manipulation, Joint mobilization, Stair training, Aquatic Therapy, Dry Needling, Electrical stimulation, Spinal manipulation, Spinal mobilization, Cryotherapy, Moist heat, scar mobilization, Taping, Traction, Ultrasound, Biofeedback, Ionotophoresis '4mg'$ /ml Dexamethasone, and Manual therapy. ?  ?  ?PLAN FOR NEXT SESSION: Progress hip and core strength as tolerated. Progress lumbar mobility as able. Core stabilization  ?  ? ? ? ?Jamecia Lerman Sula Soda, PTA/CLT, WTA ?[phone] ? ?Roseanne Reno B, PTA ?04/23/2021, 3:02 PM ? ?   ?

## 2021-04-23 NOTE — Therapy (Deleted)
?OUTPATIENT PHYSICAL THERAPY THORACOLUMBAR EVALUATION ? ? ?Patient Name: Betty Dawson ?MRN: [id number] ?DOB:Mar 06, 1955, 66 y.o., female ?Today's Date: 04/23/2021 ? ? PT End of Session - 04/23/21 1322   ? ? Visit Number 2   ? Number of Visits 12   ? Date for PT Re-Evaluation 05/28/21   ? Authorization Type [employer] ; cohere approved 12 visits 3/22-5/13   ? Authorization - Visit Number 2   ? Authorization - Number of Visits 12   ? Progress Note Due on Visit 10   ? PT Start Time 1314   ? PT Stop Time 1355   ? PT Time Calculation (min) 41 min   ? Activity Tolerance Patient tolerated treatment well   ? Behavior During Therapy [facility] for tasks assessed/performed   ? ?  ?  ? ?  ? ? ? ?Past Medical History:  ?Diagnosis Date  ? Anemia   ? Arthritis   ? OA multiple joints  ? Heart murmur   ? Heel spur   ? Hypertension   ? Varicose veins   ? ?Past Surgical History:  ?Procedure Laterality Date  ? ABDOMINAL HYSTERECTOMY    ? endometriosis  ? BREAST BIOPSY Right 2017  ? x2  ? COLONOSCOPY  02/21/2013  ? TIR:WERXVQ polyp measuring 6 mm in size was found in the distal/transverse colon; polypectomy was performed using snare cautery/The colon was redundant/The colon mucosa was otherwise normal/Normal mucosa in the terminal ileum  ? COLONOSCOPY N/A 02/21/2013  ? Procedure: COLONOSCOPY;  Surgeon: Danie Binder, MD;  [facility];  Service: Endoscopy;  Laterality: N/A;  10:30 AM  ? FOOT SURGERY Right   ? heel spur  ? SHOULDER ARTHROSCOPY WITH ROTATOR CUFF REPAIR Right 01/30/2016  ? Procedure: SHOULDER ARTHROSCOPY WITH ACROMIOPLASTY;  Surgeon: Carole Civil, MD;  Location: [facility];  Service: Orthopedics;  Laterality: Right;  pt knows to arrive at 7:15  ? SHOULDER OPEN ROTATOR CUFF REPAIR Right 01/30/2016  ? Procedure: ROTATOR CUFF REPAIR SHOULDER OPEN;  Surgeon: Carole Civil, MD;  Location: [facility];  Service: Orthopedics;  Laterality: Right;  ? ?Patient Active Problem List  ? Diagnosis Date Noted  ? Encounter for  general adult medical examination with abnormal findings 09/18/2020  ? Neck pain 09/18/2020  ? DDD (degenerative disc disease), lumbar 05/05/2019  ? Anemia, iron deficiency 10/30/2016  ? Complete tear of right rotator cuff   ? Subacromial impingement of right shoulder   ? Ankle pain, chronic 06/02/2011  ? HTN (hypertension) 05/20/2011  ? OA (osteoarthritis) 05/20/2011  ? Varicose veins 05/20/2011  ? ? ?PCP: Lindell Spar, MD ? ?REFERRING PROVIDER: Lindell Spar, MD ? ?REFERRING DIAG: M43.16 Spondylosithesis  ? ?THERAPY DIAG:  ?Low back pain, unspecified back pain laterality, unspecified chronicity, unspecified whether sciatica present ? ?ONSET DATE: Chronic ? ?SUBJECTIVE:                                                                                                                                                                                          ? ?  SUBJECTIVE STATEMENT: ?Pt states she had some pain this morning but currently without any pain.  States she did take some pain meds this morning and it helped (gabapentin).  Reports compliance with HEP. ?  ?PERTINENT HISTORY:  ?Stenosis, anterolisthesis  ? ?PAIN:  ?Are you having pain? Yes: currently no pain NPRS scale: 0/10 ?Pain location: low back ?Pain description: dull aching ?Aggravating factors: prolonged sitting/ standing, walking  ?Relieving factors: Hot tub/ bath, meds  ? ? ?PRECAUTIONS: None ? ?WEIGHT BEARING RESTRICTIONS No ? ? ?OBJECTIVE:  ? ?DIAGNOSTIC FINDINGS:  ?IMPRESSION: ?1. Severe L3-4 facet arthrosis with grade 1 anterolisthesis and mild ?spinal, lateral recess, and neural foraminal stenosis. ?2. Mild lateral recess and neural foraminal stenosis at L4-5. ?3. Mild left neural foraminal stenosis at L5-S1. ? ? ? ?TODAY'S TREATMENT  ?04/23/21 ?Abdominal bracing 10X5" ?Bridge 10X (partial range 2X5) ?SLR 5X each LE ?Trunk rotations 5X10" each way. ?Sidelying clams 10X5" each LE ?Sidelying hip abduction 10X each LE ?Long sitting hamstring stretch  3X20" each LE ?Hip excursions ? ?04/16/21 ?Ab brace 5 x 5"  ?Bridge (partial range) x 5 ? ? ?PATIENT EDUCATION:  ?Education details: review of goals, HEP and POC  ?Person educated: Patient ?Education method: Explanation and Demonstration ?Education comprehension: verbalized understanding and returned demonstration ? ? ?HOME EXERCISE PROGRAM: ?Access Code: [email or web address] ?URL: [email or web address] ?Date: 04/16/2021 ?Prepared by: Josue Hector ?Exercises ?Supine Bridge - 2-3 x daily - 7 x weekly - 1-2 sets - 10 reps - 5 second  ?Supine Transversus Abdominis Bracing - Hands on Stomach - 2-3 x daily - 7 x weekly - 1-2 sets - 10 reps - 5 second hold ? ?3/29:  Access Code: [email or web address] ?Prepared by: Roseanne Reno  long sitting hamstring stretch, SLR, sidelying hip abduction and sidelying clams ? ? ?ASSESSMENT: ? ?CLINICAL IMPRESSION: ?Reviewed goals, HEP and POC moving forward.  PT with breathing cues with abdominal bracing.  Patient will benefit from ongoing skilled physical therapy services to reduce pain and improve level of function with ADLs and functional mobility tasks. ? ? ?OBJECTIVE IMPAIRMENTS Abnormal gait, decreased activity tolerance, decreased mobility, difficulty walking, decreased ROM, decreased strength, increased fascial restrictions, increased muscle spasms, impaired flexibility, improper body mechanics, and pain.  ? ? ?GOALS: ? ?SHORT TERM GOALS: Target date: 05/07/2021 ? ?Patient will be independent with initial HEP and self-management strategies to improve functional outcomes ?Baseline:  ?Goal status: ongoing  ? ? ?LONG TERM GOALS: Target date: 06/04/2021 ? ?Patient will be independent with advanced HEP and self-management strategies to improve functional outcomes ?Baseline:  ?Goal status: ongoing ? ?2.  Patient will improve FOTO score to predicted value to indicate improvement in functional outcomes ?Baseline:  ?Goal status: ongoing ? ?3.  Patient will  report reduction of back pain to <3/10 for improved quality of life and ability to perform ADLs  ?Baseline:  ?Goal status: ongoing ? ?4. Patient will have equal to or > 4+/5 MMT throughout BLE to improve ability to perform functional mobility, stair ambulation and ADLs.  ?Baseline:  ?Goal status: ongoing ? ?PLAN: ?PT FREQUENCY: 2x/week ? ?PT DURATION: 6 weeks ? ?PLANNED INTERVENTIONS: Therapeutic exercises, Therapeutic activity, Neuromuscular re-education, Balance training, Gait training, Patient/Family education, Joint manipulation, Joint mobilization, Stair training, Aquatic Therapy, Dry Needling, Electrical stimulation, Spinal manipulation, Spinal mobilization, Cryotherapy, Moist heat, scar mobilization, Taping, Traction, Ultrasound, Biofeedback, Ionotophoresis '4mg'$ /ml Dexamethasone, and Manual therapy. ? ? ?PLAN FOR NEXT SESSION: Progress hip and core strength as tolerated. Progress lumbar mobility as able. Core stabilization  ? ?1:23  PM, 04/23/21 ?Josue Hector PT DPT  ?Physical Therapist with [place]  ?[facility]  ?[phone] ? ?

## 2021-04-25 ENCOUNTER — Ambulatory Visit (HOSPITAL_COMMUNITY): Payer: Medicare HMO

## 2021-04-25 ENCOUNTER — Encounter (HOSPITAL_COMMUNITY): Payer: Self-pay

## 2021-04-25 DIAGNOSIS — M545 Low back pain, unspecified: Secondary | ICD-10-CM | POA: Diagnosis not present

## 2021-04-25 DIAGNOSIS — M48061 Spinal stenosis, lumbar region without neurogenic claudication: Secondary | ICD-10-CM | POA: Diagnosis not present

## 2021-04-25 DIAGNOSIS — R2689 Other abnormalities of gait and mobility: Secondary | ICD-10-CM | POA: Diagnosis not present

## 2021-04-25 NOTE — Therapy (Signed)
?OUTPATIENT PHYSICAL THERAPY TREATMENT NOTE ? ? ?Patient Name: Betty Dawson ?MRN: [id number] ?DOB:1955/09/27, 66 y.o., female ?Today's Date: 04/25/2021 ? ?PCP: Lindell Spar, MD ?REFERRING PROVIDER:Thomas, Dorcas Carrow, MD ? PT End of Session - 04/25/21 1004   ? ? Visit Number 3   ? Number of Visits 12   ? Date for PT Re-Evaluation 05/28/21   ? Authorization Type [employer] ; cohere approved 12 visits 3/22-5/13   ? Authorization - Visit Number 3   ? Authorization - Number of Visits 12   ? Progress Note Due on Visit 10   ? PT Start Time [phone]   ? PT Stop Time 0955   ? PT Time Calculation (min) 42 min   ? Activity Tolerance Patient tolerated treatment well   ? Behavior During Therapy [facility] for tasks assessed/performed   ? ?  ?  ? ?  ? ? ? ?Past Medical History:  ?Diagnosis Date  ? Anemia   ? Arthritis   ? OA multiple joints  ? Heart murmur   ? Heel spur   ? Hypertension   ? Varicose veins   ? ?Past Surgical History:  ?Procedure Laterality Date  ? ABDOMINAL HYSTERECTOMY    ? endometriosis  ? BREAST BIOPSY Right 2017  ? x2  ? COLONOSCOPY  02/21/2013  ? XKG:YJEHUD polyp measuring 6 mm in size was found in the distal/transverse colon; polypectomy was performed using snare cautery/The colon was redundant/The colon mucosa was otherwise normal/Normal mucosa in the terminal ileum  ? COLONOSCOPY N/A 02/21/2013  ? Procedure: COLONOSCOPY;  Surgeon: Danie Binder, MD;  [facility];  Service: Endoscopy;  Laterality: N/A;  10:30 AM  ? FOOT SURGERY Right   ? heel spur  ? SHOULDER ARTHROSCOPY WITH ROTATOR CUFF REPAIR Right 01/30/2016  ? Procedure: SHOULDER ARTHROSCOPY WITH ACROMIOPLASTY;  Surgeon: Carole Civil, MD;  Location: [facility];  Service: Orthopedics;  Laterality: Right;  pt knows to arrive at 7:15  ? SHOULDER OPEN ROTATOR CUFF REPAIR Right 01/30/2016  ? Procedure: ROTATOR CUFF REPAIR SHOULDER OPEN;  Surgeon: Carole Civil, MD;  Location: [facility];  Service: Orthopedics;  Laterality: Right;  ? ?Patient Active  Problem List  ? Diagnosis Date Noted  ? Encounter for general adult medical examination with abnormal findings 09/18/2020  ? Neck pain 09/18/2020  ? DDD (degenerative disc disease), lumbar 05/05/2019  ? Anemia, iron deficiency 10/30/2016  ? Complete tear of right rotator cuff   ? Subacromial impingement of right shoulder   ? Ankle pain, chronic 06/02/2011  ? HTN (hypertension) 05/20/2011  ? OA (osteoarthritis) 05/20/2011  ? Varicose veins 05/20/2011  ? ? ?REFERRING DIAG: Spondylosothesis ? ?THERAPY DIAG:  ?Low back pain, unspecified back pain laterality, unspecified chronicity, unspecified whether sciatica present ? ?PERTINENT HISTORY: stenosis, anterolisthesis ? ?PRECAUTIONS: none ? ?SUBJECTIVE: Pt stated she had increased pain this morning, pain medication taken.  Pain scale 7/10 sore achey pain.  Stated exercises are going well at home.   ? ?PAIN:  ?PAIN:  ?Are you having pain? Yes: NPRS scale: 7/10 ?Pain location: LBP ?Pain description: pressure, ache, sore ?Aggravating factors: prolonged sitting/ standing, walking  ?Relieving factors: Hot tub/ bath, meds ? ?OBJECTIVE:  ?  ?DIAGNOSTIC FINDINGS:  ?IMPRESSION: ?1. Severe L3-4 facet arthrosis with grade 1 anterolisthesis and mild ?spinal, lateral recess, and neural foraminal stenosis. ?2. Mild lateral recess and neural foraminal stenosis at L4-5. ?3. Mild left neural foraminal stenosis at L5-S1. ?  ?  ?  ?  ?  TODAY'S TREATMENT  ?04/25/21 ?Hip excursions 5X each direction ?LTR 5x 10" ?SKTC 2x 30" ?Diaphragmatic breathing with hands on chest/stomach x 3' ?Abdominal bracing x 1' paired with exhale ?Bent knee raise 10x 3" with ab set ?Bridge ?Sidelying: hip abduction 10x5" ? Clam 10x 5" ?Quadruped: cat/cow 5x 10" ? ? ?04/23/21 ?Abdominal bracing 10X5" ?Bridge 10X (partial range 2X5) ?SLR 5X each LE ?Trunk rotations 5X10" each way. ?Sidelying clams 10X5" each LE ?sidelying hip abduction 10X each LE ?Long sitting hamstring stretch 3X20" each LE ?Hip excursions 5X each  direction ? ?04/16/21 ?Ab brace 5 x 5"  ?Bridge (partial range) x 5 ?  ?  ?PATIENT EDUCATION:  ?Education details: on evaluation findings, POC and HEP  ?Person educated: Patient ?Education method: Explanation and Demonstration ?Education comprehension: verbalized understanding and returned demonstration ?  ?  ?HOME EXERCISE PROGRAM: ?Access Code: [email or web address] ?URL: [email or web address] ?Date: 04/16/2021 ?Prepared by: Josue Hector ?  ?Exercises ?Supine Bridge - 2-3 x daily - 7 x weekly - 1-2 sets - 10 reps - 5 second hold ?Supine Transversus Abdominis Bracing - Hands on Stomach - 2-3 x daily - 7 x weekly - 1-2 sets - 10 reps - 5 second hold ?  ?3/29:  Access Code: [email or web address] ?Prepared by: Roseanne Reno  long sitting hamstring stretch, SLR, sidelying hip abduction and sidelying clams ?  ?ASSESSMENT: ?  ?CLINICAL IMPRESSION: ?Pt with tendency to hold breath with ab sets, instructed diaphragmatic breathing to reduce accessory musculature activation and then paired abdominal sets with exhalation which was difficulty initially, improved with reps and cueing.  Added stretches to address c/o stiffness.  Core strengthening exercises complete with cueing for stability with movements.  No reports of increased pain at EOS.   ?  ?OBJECTIVE IMPAIRMENTS Abnormal gait, decreased activity tolerance, decreased mobility, difficulty walking, decreased ROM, decreased strength, increased fascial restrictions, increased muscle spasms, impaired flexibility, improper body mechanics, and pain.  ?  ?ACTIVITY LIMITATIONS cleaning, community activity, driving, meal prep, laundry, yard work, shopping, and yard work.  ?  ?PERSONAL FACTORS Past/current experiences are also affecting patient's functional outcome.  ?  ?  ?REHAB POTENTIAL: Good ?  ?CLINICAL DECISION MAKING: Stable/uncomplicated ?  ?EVALUATION COMPLEXITY: Low ?  ?  ?GOALS: ?  ?SHORT TERM GOALS: Target date: 04/30/2021 ?  ?Patient will be  independent with initial HEP and self-management strategies to improve functional outcomes ?Baseline:  ?Goal status: ongoing  ?  ?  ?LONG TERM GOALS: Target date: 05/28/2021 ?  ?Patient will be independent with advanced HEP and self-management strategies to improve functional outcomes ?Baseline:  ?Goal status: ongoing ?  ?2.  Patient will improve FOTO score to predicted value to indicate improvement in functional outcomes ?Baseline:  ?Goal status: ongoing ?  ?3.  Patient will report reduction of back pain to <3/10 for improved quality of life and ability to perform ADLs  ?Baseline:  ?Goal status: ongoing ?  ?4. Patient will have equal to or > 4+/5 MMT throughout BLE to improve ability to perform functional mobility, stair ambulation and ADLs.  ?Baseline:  ?Goal status: ongoing ?  ?PLAN: ?PT FREQUENCY: 2x/week ?  ?PT DURATION: 6 weeks ?  ?PLANNED INTERVENTIONS: Therapeutic exercises, Therapeutic activity, Neuromuscular re-education, Balance training, Gait training, Patient/Family education, Joint manipulation, Joint mobilization, Stair training, Aquatic Therapy, Dry Needling, Electrical stimulation, Spinal manipulation, Spinal mobilization, Cryotherapy, Moist heat, scar mobilization, Taping, Traction, Ultrasound, Biofeedback, Ionotophoresis '4mg'$ /ml Dexamethasone, and Manual therapy. ?  ?  ?PLAN FOR NEXT SESSION: Progress hip and core strength  as tolerated. Progress lumbar mobility as able. Core stabilization  ?  ? ? ?Ihor Austin, LPTA/CLT; CBIS ?[phone] ? ?Aldona Lento, PTA ?04/25/2021, 10:06 AM ? ?   ?

## 2021-04-28 ENCOUNTER — Ambulatory Visit (HOSPITAL_COMMUNITY): Payer: Medicare HMO | Attending: Neurosurgery | Admitting: Physical Therapy

## 2021-04-28 DIAGNOSIS — M4316 Spondylolisthesis, lumbar region: Secondary | ICD-10-CM | POA: Diagnosis not present

## 2021-04-28 DIAGNOSIS — M48061 Spinal stenosis, lumbar region without neurogenic claudication: Secondary | ICD-10-CM | POA: Diagnosis not present

## 2021-04-28 DIAGNOSIS — M545 Low back pain, unspecified: Secondary | ICD-10-CM

## 2021-04-28 NOTE — Therapy (Signed)
?OUTPATIENT PHYSICAL THERAPY TREATMENT NOTE ? ? ?Patient Name: Betty Dawson ?MRN: [id number] ?DOB:August 10, 1955, 66 y.o., female ?Today's Date: 04/28/2021 ? ?PCP: Lindell Spar, MD ?REFERRING PROVIDER:Thomas, Dorcas Carrow, MD ? PT End of Session - 04/28/21 1053   ? ? Visit Number 4   ? Number of Visits 12   ? Date for PT Re-Evaluation 05/28/21   ? Authorization Type [employer] ; cohere approved 12 visits 3/22-5/13   ? Authorization - Visit Number 4   ? Authorization - Number of Visits 12   ? Progress Note Due on Visit 10   ? PT Start Time 1050   ? PT Stop Time 1128   ? PT Time Calculation (min) 38 min   ? Activity Tolerance Patient tolerated treatment well   ? Behavior During Therapy [facility] for tasks assessed/performed   ? ?  ?  ? ?  ? ? ? ?Past Medical History:  ?Diagnosis Date  ? Anemia   ? Arthritis   ? OA multiple joints  ? Heart murmur   ? Heel spur   ? Hypertension   ? Varicose veins   ? ?Past Surgical History:  ?Procedure Laterality Date  ? ABDOMINAL HYSTERECTOMY    ? endometriosis  ? BREAST BIOPSY Right 2017  ? x2  ? COLONOSCOPY  02/21/2013  ? OJJ:KKXFGH polyp measuring 6 mm in size was found in the distal/transverse colon; polypectomy was performed using snare cautery/The colon was redundant/The colon mucosa was otherwise normal/Normal mucosa in the terminal ileum  ? COLONOSCOPY N/A 02/21/2013  ? Procedure: COLONOSCOPY;  Surgeon: Danie Binder, MD;  [facility];  Service: Endoscopy;  Laterality: N/A;  10:30 AM  ? FOOT SURGERY Right   ? heel spur  ? SHOULDER ARTHROSCOPY WITH ROTATOR CUFF REPAIR Right 01/30/2016  ? Procedure: SHOULDER ARTHROSCOPY WITH ACROMIOPLASTY;  Surgeon: Carole Civil, MD;  Location: [facility];  Service: Orthopedics;  Laterality: Right;  pt knows to arrive at 7:15  ? SHOULDER OPEN ROTATOR CUFF REPAIR Right 01/30/2016  ? Procedure: ROTATOR CUFF REPAIR SHOULDER OPEN;  Surgeon: Carole Civil, MD;  Location: [facility];  Service: Orthopedics;  Laterality: Right;  ? ?Patient Active  Problem List  ? Diagnosis Date Noted  ? Encounter for general adult medical examination with abnormal findings 09/18/2020  ? Neck pain 09/18/2020  ? DDD (degenerative disc disease), lumbar 05/05/2019  ? Anemia, iron deficiency 10/30/2016  ? Complete tear of right rotator cuff   ? Subacromial impingement of right shoulder   ? Ankle pain, chronic 06/02/2011  ? HTN (hypertension) 05/20/2011  ? OA (osteoarthritis) 05/20/2011  ? Varicose veins 05/20/2011  ? ? ?REFERRING DIAG: Spondylosothesis ? ?THERAPY DIAG:  ?Low back pain, unspecified back pain laterality, unspecified chronicity, unspecified whether sciatica present ? ?PERTINENT HISTORY: stenosis, anterolisthesis ? ?PRECAUTIONS: none ? ?SUBJECTIVE: Pt stated she had increased pain this morning, pain medication taken and now she is painfree. ? ?PAIN:  ?Are you having pain? Yes: NPRS scale: 0/10 ?Pain location: LBP ?Pain description: pressure, ache, sore ?Aggravating factors: prolonged sitting/ standing, walking  ?Relieving factors: Hot tub/ bath, meds ? ?OBJECTIVE:  ?  ?DIAGNOSTIC FINDINGS:  ?IMPRESSION: ?1. Severe L3-4 facet arthrosis with grade 1 anterolisthesis and mild ?spinal, lateral recess, and neural foraminal stenosis. ?2. Mild lateral recess and neural foraminal stenosis at L4-5. ?3. Mild left neural foraminal stenosis at L5-S1. ?  ?  ?  ?  ?TODAY'S TREATMENT  ? ?04/28/21 ?Hip excursions 5X each direction ?Hamstring stretch with  towel 3X30" each LE  ?SKTC 2x 30" ?Bent knee raise 10x 3" with ab set ?Bridge 2X10 ?SLR 2X10 each ?Sidelying: hip abduction bilaterally 2X10  ?Quadruped: cat/cow 5x 10" ? ?04/25/21 ?Hip excursions 5X each direction ?LTR 5x 10" ?SKTC 2x 30" ?Diaphragmatic breathing with hands on chest/stomach x 3' ?Abdominal bracing x 1' paired with exhale ?Bent knee raise 10x 3" with ab set ?Bridge ?Sidelying: hip abduction 10x5" ? Clam 10x 5" ?Quadruped: cat/cow 5x 10" ? ? ?04/23/21 ?Abdominal bracing 10X5" ?Bridge 10X (partial range 2X5) ?SLR 5X each  LE ?Trunk rotations 5X10" each way. ?Sidelying clams 10X5" each LE ?sidelying hip abduction 10X each LE ?Long sitting hamstring stretch 3X20" each LE ?Hip excursions 5X each direction ? ?04/16/21 ?Ab brace 5 x 5"  ?Bridge (partial range) x 5 ?  ?  ?PATIENT EDUCATION:  ?Education details: on evaluation findings, POC and HEP  ?Person educated: Patient ?Education method: Explanation and Demonstration ?Education comprehension: verbalized understanding and returned demonstration ?  ?  ?HOME EXERCISE PROGRAM: ?Access Code: [email or web address] ?URL: [email or web address] ?Date: 04/16/2021 ?Prepared by: Josue Hector ?  ?Exercises ?Supine Bridge - 2-3 x daily - 7 x weekly - 1-2 sets - 10 reps - 5 second hold ?Supine Transversus Abdominis Bracing - Hands on Stomach - 2-3 x daily - 7 x weekly - 1-2 sets - 10 reps - 5 second hold ?  ?3/29:  Access Code: [email or web address] ?Prepared by: Roseanne Reno  long sitting hamstring stretch, SLR, sidelying hip abduction and sidelying clams ?  ?ASSESSMENT: ?  ?CLINICAL IMPRESSION: ?Continued with core stab, stretching and LE strengthening.  Added supine hamstring stretch this session with good results with opposite LE fully extended.  Pt with good form with all exercises as well as hold times, however still continues to hold breath at time requiring cues for diaphragmatic breathing to reduce accessory musculature activation.  Able to also increase sets/reps this session with ease.  Straight leg raise   Core strengthening exercises complete with cueing for stability with movements.  No reports of increased pain at EOS.  Pt will continue to benefit from skilled physical therapy. ?  ?OBJECTIVE IMPAIRMENTS Abnormal gait, decreased activity tolerance, decreased mobility, difficulty walking, decreased ROM, decreased strength, increased fascial restrictions, increased muscle spasms, impaired flexibility, improper body mechanics, and pain.  ?  ?  ?GOALS: ?   ?SHORT TERM GOALS: Target date: 04/30/2021 ?  ?Patient will be independent with initial HEP and self-management strategies to improve functional outcomes ?Baseline:  ?Goal status: ongoing  ?  ?  ?LONG TERM GOALS: Target date: 05/28/2021 ?  ?Patient will be independent with advanced HEP and self-management strategies to improve functional outcomes ?Baseline:  ?Goal status: ongoing ?  ?2.  Patient will improve FOTO score to predicted value to indicate improvement in functional outcomes ?Baseline:  ?Goal status: ongoing ?  ?3.  Patient will report reduction of back pain to <3/10 for improved quality of life and ability to perform ADLs  ?Baseline:  ?Goal status: ongoing ?  ?4. Patient will have equal to or > 4+/5 MMT throughout BLE to improve ability to perform functional mobility, stair ambulation and ADLs.  ?Baseline:  ?Goal status: ongoing ?  ?PLAN: ?PT FREQUENCY: 2x/week ?  ?PT DURATION: 6 weeks ?  ?PLANNED INTERVENTIONS: Therapeutic exercises, Therapeutic activity, Neuromuscular re-education, Balance training, Gait training, Patient/Family education, Joint manipulation, Joint mobilization, Stair training, Aquatic Therapy, Dry Needling, Electrical stimulation, Spinal manipulation, Spinal mobilization, Cryotherapy, Moist heat, scar mobilization, Taping, Traction, Ultrasound, Biofeedback, Ionotophoresis '4mg'$ /ml  Dexamethasone, and Manual therapy. ?  ?  ?PLAN FOR NEXT SESSION: continue to progress hip and core strength as tolerated. Progress lumbar mobility as able. Core stabilization  ?  ? ? ?Glora Hulgan S[place]oda, PTA/CLT, WTA ?[phone] ? ?Roseanne Reno B, PTA ?04/28/2021, 10:53 AM ? ?   ?

## 2021-04-30 ENCOUNTER — Encounter (HOSPITAL_COMMUNITY): Payer: Self-pay | Admitting: Physical Therapy

## 2021-04-30 ENCOUNTER — Ambulatory Visit (HOSPITAL_COMMUNITY): Payer: Medicare HMO | Admitting: Physical Therapy

## 2021-04-30 DIAGNOSIS — M48061 Spinal stenosis, lumbar region without neurogenic claudication: Secondary | ICD-10-CM | POA: Diagnosis not present

## 2021-04-30 DIAGNOSIS — M4316 Spondylolisthesis, lumbar region: Secondary | ICD-10-CM | POA: Diagnosis not present

## 2021-04-30 DIAGNOSIS — M545 Low back pain, unspecified: Secondary | ICD-10-CM

## 2021-04-30 NOTE — Therapy (Signed)
?OUTPATIENT PHYSICAL THERAPY TREATMENT NOTE ? ? ?Patient Name: Betty Dawson ?MRN: [id number] ?DOB:[birth year], 66 y.o., female ?Today's Date: 04/30/2021 ? ?PCP: Lindell Spar, MD ?REFERRING PROVIDER: Lindell Spar, MD ? ?END OF SESSION:  ? PT End of Session - 04/30/21 0903   ? ? Visit Number 5   ? Number of Visits 12   ? Date for PT Re-Evaluation 05/28/21   ? Authorization Type [employer] ; cohere approved 12 visits 3/22-5/13   ? Authorization - Visit Number 5   ? Authorization - Number of Visits 12   ? Progress Note Due on Visit 10   ? PT Start Time 0901   ? PT Stop Time 0940   ? PT Time Calculation (min) 39 min   ? Activity Tolerance Patient tolerated treatment well   ? Behavior During Therapy [facility] for tasks assessed/performed   ? ?  ?  ? ?  ? ? ?Past Medical History:  ?Diagnosis Date  ? Anemia   ? Arthritis   ? OA multiple joints  ? Heart murmur   ? Heel spur   ? Hypertension   ? Varicose veins   ? ?Past Surgical History:  ?Procedure Laterality Date  ? ABDOMINAL HYSTERECTOMY    ? endometriosis  ? BREAST BIOPSY Right 2017  ? x2  ? COLONOSCOPY  02/21/2013  ? JOI:NOMVEH polyp measuring 6 mm in size was found in the distal/transverse colon; polypectomy was performed using snare cautery/The colon was redundant/The colon mucosa was otherwise normal/Normal mucosa in the terminal ileum  ? COLONOSCOPY N/A 02/21/2013  ? Procedure: COLONOSCOPY;  Surgeon: Danie Binder, MD;  [facility];  Service: Endoscopy;  Laterality: N/A;  10:30 AM  ? FOOT SURGERY Right   ? heel spur  ? SHOULDER ARTHROSCOPY WITH ROTATOR CUFF REPAIR Right 01/30/2016  ? Procedure: SHOULDER ARTHROSCOPY WITH ACROMIOPLASTY;  Surgeon: Carole Civil, MD;  Location: [facility];  Service: Orthopedics;  Laterality: Right;  pt knows to arrive at 7:15  ? SHOULDER OPEN ROTATOR CUFF REPAIR Right 01/30/2016  ? Procedure: ROTATOR CUFF REPAIR SHOULDER OPEN;  Surgeon: Carole Civil, MD;  Location: [facility];  Service: Orthopedics;  Laterality: Right;   ? ?Patient Active Problem List  ? Diagnosis Date Noted  ? Encounter for general adult medical examination with abnormal findings 09/18/2020  ? Neck pain 09/18/2020  ? DDD (degenerative disc disease), lumbar 05/05/2019  ? Anemia, iron deficiency 10/30/2016  ? Complete tear of right rotator cuff   ? Subacromial impingement of right shoulder   ? Ankle pain, chronic 06/02/2011  ? HTN (hypertension) 05/20/2011  ? OA (osteoarthritis) 05/20/2011  ? Varicose veins 05/20/2011  ? ? ?REFERRING DIAG: Spondylosothesis ? ?THERAPY DIAG:  ?Low back pain, unspecified back pain laterality, unspecified chronicity, unspecified whether sciatica present ? ?PERTINENT HISTORY: stenosis, anterolisthesis ? ?PRECAUTIONS: none ? ?SUBJECTIVE: Patient reports that the stretches she has been doing at home have been helping with her back pain in the moment, but she is still having back pain during work. ? ?PAIN:  ?Are you having pain?  Yes: NPRS scale: 5/10 ?Pain location: Rt side low back ?Pain description: pressure, ache, sore ?Aggravating factors: prolonged sitting/ standing, walking  ?Relieving factors: Hot tub/ bath, meds ? ? ?OBJECTIVE:  ?  ?DIAGNOSTIC FINDINGS:  ?IMPRESSION: ?1. Severe L3-4 facet arthrosis with grade 1 anterolisthesis and mild ?spinal, lateral recess, and neural foraminal stenosis. ?2. Mild lateral recess and neural foraminal stenosis at L4-5. ?3. Mild left neural foraminal  stenosis at L5-S1. ?   ?  ?TODAY'S TREATMENT  ?04/30/21: ? Aerobic: ?-NuStep x34mn(50-70spm) ?Supine: ?-Bridge w/ belt for hip abd 4x8 ?Standing: ?-Banded(red) supermans w/ slow march 5x30s ?-Unilateral farmer's carry 16lbs + BTB 3x770feach ? ? ?04/28/21 ?Hip excursions 5X each direction ?Hamstring stretch with towel 3X30" each LE  ?SKTC 2x 30" ?Bent knee raise 10x 3" with ab set ?Bridge 2X10 ?SLR 2X10 each ?Sidelying: hip abduction bilaterally 2X10         ?Quadruped: cat/cow 5x 10" ?  ?04/25/21 ?Hip excursions 5X each direction ?LTR 5x 10" ?SKTC 2x  30" ?Diaphragmatic breathing with hands on chest/stomach x 3' ?Abdominal bracing x 1' paired with exhale ?Bent knee raise 10x 3" with ab set ?Bridge ?Sidelying: hip abduction 10x5" ?            Clam 10x 5" ?Quadruped: cat/cow 5x 10" ?  ?  ?04/23/21 ?Abdominal bracing 10X5" ?Bridge 10X (partial range 2X5) ?SLR 5X each LE ?Trunk rotations 5X10" each way. ?Sidelying clams 10X5" each LE ?sidelying hip abduction 10X each LE ?Long sitting hamstring stretch 3X20" each LE ?Hip excursions 5X each direction ?  ?04/16/21 ?Ab brace 5 x 5"  ?Bridge (partial range) x 5 ?  ?  ?PATIENT EDUCATION:  ?Education details: on evaluation findings, POC and HEP  ?Person educated: Patient ?Education method: Explanation and Demonstration ?Education comprehension: verbalized understanding and returned demonstration ?  ?  ?HOME EXERCISE PROGRAM: ?Access Code: [email or web address]: [email or web address] ?Date: 04/16/2021 ?Prepared by: CaJosue Hector  ?Exercises ?Supine Bridge - 2-3 x daily - 7 x weekly - 1-2 sets - 10 reps - 5 second hold ?Supine Transversus Abdominis Bracing - Hands on Stomach - 2-3 x daily - 7 x weekly - 1-2 sets - 10 reps - 5 second hold ?  ?3/29:  Access Code: [email or web address] ?Prepared by: AmRoseanne Renolong sitting hamstring stretch, SLR, sidelying hip abduction and sidelying clams ?  ?ASSESSMENT: ?  ?CLINICAL IMPRESSION: ?Core stability in weight bearing was well tolerated, but she did have a "pressure" in the back during the Lt sided unilateral farmer's carry. This was reduced to some extent once appropriate breathing was cued. Increased lateral trunk lean to the Rt side during the Lt handed farmer's carry was also noted. ?  ?OBJECTIVE IMPAIRMENTS Abnormal gait, decreased activity tolerance, decreased mobility, difficulty walking, decreased ROM, decreased strength, increased fascial restrictions, increased muscle spasms, impaired flexibility, improper body mechanics, and pain.  ?   ?  ?GOALS: ?  ?SHORT TERM GOALS: Target date: 04/30/2021 ?  ?Patient will be independent with initial HEP and self-management strategies to improve functional outcomes ?Baseline:  ?Goal status: ongoing  ?  ?  ?LONG TERM GOALS: Target date: 05/28/2021 ?  ?Patient will be independent with advanced HEP and self-management strategies to improve functional outcomes ?Baseline:  ?Goal status: ongoing ?  ?2.  Patient will improve FOTO score to predicted value to indicate improvement in functional outcomes ?Baseline:  ?Goal status: ongoing ?  ?3.  Patient will report reduction of back pain to <3/10 for improved quality of life and ability to perform ADLs  ?Baseline:  ?Goal status: ongoing ?  ?4. Patient will have equal to or > 4+/5 MMT throughout BLE to improve ability to perform functional mobility, stair ambulation and ADLs.  ?Baseline:  ?Goal status: ongoing ?  ?PLAN: ?PT FREQUENCY: 2x/week ?  ?PT DURATION: 6 weeks ?  ?PLANNED INTERVENTIONS: Therapeutic exercises, Therapeutic activity, Neuromuscular re-education, Balance training, Gait training, Patient/Family education, Joint  manipulation, Joint mobilization, Stair training, Aquatic Therapy, Dry Needling, Electrical stimulation, Spinal manipulation, Spinal mobilization, Cryotherapy, Moist heat, scar mobilization, Taping, Traction, Ultrasound, Biofeedback, Ionotophoresis '4mg'$ /ml Dexamethasone, and Manual therapy. ?  ?  ?PLAN FOR NEXT SESSION: continue to progress hip and core strength as tolerated. Progress lumbar mobility as able. Core stabilization  ? ? ?Adalberto Cole, PT ?04/30/2021, 9:45 AM ? ?  ? ?

## 2021-05-05 ENCOUNTER — Encounter (HOSPITAL_COMMUNITY): Payer: Self-pay

## 2021-05-05 ENCOUNTER — Ambulatory Visit (HOSPITAL_COMMUNITY): Payer: Medicare HMO

## 2021-05-05 DIAGNOSIS — M4316 Spondylolisthesis, lumbar region: Secondary | ICD-10-CM | POA: Diagnosis not present

## 2021-05-05 DIAGNOSIS — M545 Low back pain, unspecified: Secondary | ICD-10-CM

## 2021-05-05 DIAGNOSIS — M48061 Spinal stenosis, lumbar region without neurogenic claudication: Secondary | ICD-10-CM | POA: Diagnosis not present

## 2021-05-05 NOTE — Therapy (Signed)
?OUTPATIENT PHYSICAL THERAPY TREATMENT NOTE ? ? ?Patient Name: Betty Dawson ?MRN: [id number] ?DOB:[birth year], 66 y.o., female ?Today's Date: 05/05/2021 ? ?PCP: Lindell Spar, MD ?REFERRING PROVIDER: Lindell Spar, MD ? ?END OF SESSION:  ? PT End of Session - 05/05/21 0945   ? ? Visit Number 6   ? Number of Visits 12   ? Date for PT Re-Evaluation 05/28/21   ? Authorization Type [employer] ; cohere approved 12 visits 3/22-5/13   ? Authorization - Visit Number 6   ? Authorization - Number of Visits 12   ? Progress Note Due on Visit 10   ? PT Start Time [id number]   ? PT Stop Time 4008   ? PT Time Calculation (min) 41 min   ? Activity Tolerance Patient tolerated treatment well   ? Behavior During Therapy [facility] for tasks assessed/performed   ? ?  ?  ? ?  ? ? ?Past Medical History:  ?Diagnosis Date  ? Anemia   ? Arthritis   ? OA multiple joints  ? Heart murmur   ? Heel spur   ? Hypertension   ? Varicose veins   ? ?Past Surgical History:  ?Procedure Laterality Date  ? ABDOMINAL HYSTERECTOMY    ? endometriosis  ? BREAST BIOPSY Right 2017  ? x2  ? COLONOSCOPY  02/21/2013  ? [id number]:[id number] polyp measuring 6 mm in size was found in the distal/transverse colon; polypectomy was performed using snare cautery/The colon was redundant/The colon mucosa was otherwise normal/Normal mucosa in the terminal ileum  ? COLONOSCOPY N/A 02/21/2013  ? Procedure: COLONOSCOPY;  Surgeon: Danie Binder, MD;  [facility];  Service: Endoscopy;  Laterality: N/A;  10:30 AM  ? FOOT SURGERY Right   ? heel spur  ? SHOULDER ARTHROSCOPY WITH ROTATOR CUFF REPAIR Right 01/30/2016  ? Procedure: SHOULDER ARTHROSCOPY WITH ACROMIOPLASTY;  Surgeon: Carole Civil, MD;  Location: [facility];  Service: Orthopedics;  Laterality: Right;  pt knows to arrive at 7:15  ? SHOULDER OPEN ROTATOR CUFF REPAIR Right 01/30/2016  ? Procedure: ROTATOR CUFF REPAIR SHOULDER OPEN;  Surgeon: Carole Civil, MD;  Location: [facility];  Service: Orthopedics;  Laterality: Right;   ? ?Patient Active Problem List  ? Diagnosis Date Noted  ? Encounter for general adult medical examination with abnormal findings 09/18/2020  ? Neck pain 09/18/2020  ? DDD (degenerative disc disease), lumbar 05/05/2019  ? Anemia, iron deficiency 10/30/2016  ? Complete tear of right rotator cuff   ? Subacromial impingement of right shoulder   ? Ankle pain, chronic 06/02/2011  ? HTN (hypertension) 05/20/2011  ? OA (osteoarthritis) 05/20/2011  ? Varicose veins 05/20/2011  ? ? ?REFERRING DIAG: Spondylosothesis ? ?THERAPY DIAG:  ?Low back pain, unspecified back pain laterality, unspecified chronicity, unspecified whether sciatica present ? ?PERTINENT HISTORY: stenosis, anterolisthesis ? ?PRECAUTIONS: none ? ?SUBJECTIVE: Patient continues with some left sided lower back/belt line pain, didn't exercise yesterday but they continue to help.  ? ?PAIN:  ?Are you having pain?  Yes: NPRS scale: 6/10 ?Pain location: Rt side low back ?Pain description: pressure, ache, sore ?Aggravating factors: prolonged sitting/ standing, walking  ?Relieving factors: Hot tub/ bath, meds ? ? ?OBJECTIVE:  ?  ?DIAGNOSTIC FINDINGS:  ?IMPRESSION: ?1. Severe L3-4 facet arthrosis with grade 1 anterolisthesis and mild ?spinal, lateral recess, and neural foraminal stenosis. ?2. Mild lateral recess and neural foraminal stenosis at L4-5. ?3. Mild left neural foraminal stenosis at L5-S1. ?   ?  ?TODAY'S TREATMENT  ?  05/05/21: ? Supine: - core marching 2 x 20 with cue for abdominal activation ?  - LTR with cue for hold down and not to rush 2 x 20  ?  - DKTC reverse crunch 2 x 10  hold for 3 seconds  ?  - SLR B 2 x 10  cue for quad activation with core activation ?  - Sidelying hip abduction 2 x 10 with cue for core activation, no abdominal rolling ? Seated:  - big blue ball roll out lumbar flexion stretch 2 x 10  with cue for 5 second hold ? Sit to stand  2 x 5 reps  ? Standing: - Unilateral farmer's carry 10lb dumbbell in hand 4 x 75 feet alternating  hands ?  - Stair training exercises = 5 time R leg lift to tap on step with cueing for balance and core activation;  5 time same L ?  - Step up 5 times R with full lift and then return, same on L ?  - Up stairs 1 x with cue for continued muscle activation, step to pattern but cued to alternate ?  - Down stairs patient chose step to leading with R leg ? NuStep x 5 mins end of session, seat and arms 10, level 1   ? ?04/30/21: ? Aerobic: ?-NuStep x3mn(50-70spm) ?Supine: ?-Bridge w/ belt for hip abd 4x8 ?Standing: ?-Banded(red) supermans w/ slow march 5x30s ?-Unilateral farmer's carry 16lbs + BTB 3x759feach ? ? ?04/28/21 ?Hip excursions 5X each direction ?Hamstring stretch with towel 3X30" each LE  ?SKTC 2x 30" ?Bent knee raise 10x 3" with ab set ?Bridge 2X10 ?SLR 2X10 each ?Sidelying: hip abduction bilaterally 2X10         ?Quadruped: cat/cow 5x 10" ?  ?04/25/21 ?Hip excursions 5X each direction ?LTR 5x 10" ?SKTC 2x 30" ?Diaphragmatic breathing with hands on chest/stomach x 3' ?Abdominal bracing x 1' paired with exhale ?Bent knee raise 10x 3" with ab set ?Bridge ?Sidelying: hip abduction 10x5" ?            Clam 10x 5" ?Quadruped: cat/cow 5x 10" ?  ?  ?04/23/21 ?Abdominal bracing 10X5" ?Bridge 10X (partial range 2X5) ?SLR 5X each LE ?Trunk rotations 5X10" each way. ?Sidelying clams 10X5" each LE ?sidelying hip abduction 10X each LE ?Long sitting hamstring stretch 3X20" each LE ?Hip excursions 5X each direction ?  ?04/16/21 ?Ab brace 5 x 5"  ?Bridge (partial range) x 5 ?  ?  ?PATIENT EDUCATION:  ?Education details: on evaluation findings, POC and HEP  ?Person educated: Patient ?Education method: Explanation and Demonstration ?Education comprehension: verbalized understanding and returned demonstration ?  ?  ?HOME EXERCISE PROGRAM: ?Access Code: [email or web address]: [email or web address] ?Date: 04/16/2021 ?Prepared by: CaJosue Hector  ?Exercises ?Supine Bridge - 2-3 x daily - 7 x weekly - 1-2 sets - 10 reps - 5  second hold ?Supine Transversus Abdominis Bracing - Hands on Stomach - 2-3 x daily - 7 x weekly - 1-2 sets - 10 reps - 5 second hold ?  ?3/29:  Access Code: [email or web address] ?Prepared by: AmRoseanne Renolong sitting hamstring stretch, SLR, sidelying hip abduction and sidelying clams ?  ?ASSESSMENT: ?  ?CLINICAL IMPRESSION: ?Today's session continued to build core activation through review of supine activity and building into functional training. During sit to stand, patient originally with some lumbar tension that reduced with core cueing, this trend continued again in farmers carry. Thus, continued need for core strengthening and movement patterning cueing for  proper functional carryover. Does well with flexion based activity to relieve tension throughout session. Patient also asked about stairs as states this is hard in community, practice today and needs continued skilled training as she demonstrated heavy use of UE pull and difficulty with LE support.  Patient is a good candidate for continued skilled physical therapy to progress toward goals and improve function. ?  ?OBJECTIVE IMPAIRMENTS Abnormal gait, decreased activity tolerance, decreased mobility, difficulty walking, decreased ROM, decreased strength, increased fascial restrictions, increased muscle spasms, impaired flexibility, improper body mechanics, and pain.  ?  ?  ?GOALS: ?  ?SHORT TERM GOALS: Target date: 04/30/2021 ?  ?Patient will be independent with initial HEP and self-management strategies to improve functional outcomes ?Baseline:  ?Goal status: ongoing  ?  ?  ?LONG TERM GOALS: Target date: 05/28/2021 ?  ?Patient will be independent with advanced HEP and self-management strategies to improve functional outcomes ?Baseline:  ?Goal status: ongoing ?  ?2.  Patient will improve FOTO score to predicted value to indicate improvement in functional outcomes ?Baseline:  ?Goal status: ongoing ?  ?3.  Patient will report  reduction of back pain to <3/10 for improved quality of life and ability to perform ADLs  ?Baseline:  ?Goal status: ongoing ?  ?4. Patient will have equal to or > 4+/5 MMT throughout BLE to improve ability to perform func

## 2021-05-07 ENCOUNTER — Encounter (HOSPITAL_COMMUNITY): Payer: Self-pay | Admitting: Physical Therapy

## 2021-05-07 ENCOUNTER — Ambulatory Visit (HOSPITAL_COMMUNITY): Payer: Medicare HMO | Admitting: Physical Therapy

## 2021-05-07 DIAGNOSIS — M545 Low back pain, unspecified: Secondary | ICD-10-CM

## 2021-05-07 DIAGNOSIS — M48061 Spinal stenosis, lumbar region without neurogenic claudication: Secondary | ICD-10-CM | POA: Diagnosis not present

## 2021-05-07 DIAGNOSIS — M4316 Spondylolisthesis, lumbar region: Secondary | ICD-10-CM | POA: Diagnosis not present

## 2021-05-07 NOTE — Therapy (Signed)
?OUTPATIENT PHYSICAL THERAPY TREATMENT NOTE ? ? ?Patient Name: Betty Dawson ?MRN: [id number] ?DOB:1955-09-22, 66 y.o., female ?Today's Date: 05/07/2021 ? ?PCP: Lindell Spar, MD ?REFERRING PROVIDER: Lindell Spar, MD ? ?END OF SESSION:  ? PT End of Session - 05/07/21 2353   ? ? Visit Number 7   ? Number of Visits 12   ? Date for PT Re-Evaluation 05/28/21   ? Authorization Type [employer] ; cohere approved 12 visits 3/22-5/13   ? Authorization - Visit Number 7   ? Authorization - Number of Visits 12   ? Progress Note Due on Visit 10   ? PT Start Time [phone]   ? PT Stop Time 0856   ? PT Time Calculation (min) 40 min   ? Activity Tolerance Patient tolerated treatment well   ? Behavior During Therapy [facility] for tasks assessed/performed   ? ?  ?  ? ?  ? ? ?Past Medical History:  ?Diagnosis Date  ? Anemia   ? Arthritis   ? OA multiple joints  ? Heart murmur   ? Heel spur   ? Hypertension   ? Varicose veins   ? ?Past Surgical History:  ?Procedure Laterality Date  ? ABDOMINAL HYSTERECTOMY    ? endometriosis  ? BREAST BIOPSY Right 2017  ? x2  ? COLONOSCOPY  02/21/2013  ? [id number] polyp measuring 6 mm in size was found in the distal/transverse colon; polypectomy was performed using snare cautery/The colon was redundant/The colon mucosa was otherwise normal/Normal mucosa in the terminal ileum  ? COLONOSCOPY N/A 02/21/2013  ? Procedure: COLONOSCOPY;  Surgeon: Danie Binder, MD;  [facility];  Service: Endoscopy;  Laterality: N/A;  10:30 AM  ? FOOT SURGERY Right   ? heel spur  ? SHOULDER ARTHROSCOPY WITH ROTATOR CUFF REPAIR Right 01/30/2016  ? Procedure: SHOULDER ARTHROSCOPY WITH ACROMIOPLASTY;  Surgeon: Carole Civil, MD;  Location: [facility];  Service: Orthopedics;  Laterality: Right;  pt knows to arrive at 7:15  ? SHOULDER OPEN ROTATOR CUFF REPAIR Right 01/30/2016  ? Procedure: ROTATOR CUFF REPAIR SHOULDER OPEN;  Surgeon: Carole Civil, MD;  Location: [facility];  Service: Orthopedics;  Laterality: Right;   ? ?Patient Active Problem List  ? Diagnosis Date Noted  ? Encounter for general adult medical examination with abnormal findings 09/18/2020  ? Neck pain 09/18/2020  ? DDD (degenerative disc disease), lumbar 05/05/2019  ? Anemia, iron deficiency 10/30/2016  ? Complete tear of right rotator cuff   ? Subacromial impingement of right shoulder   ? Ankle pain, chronic 06/02/2011  ? HTN (hypertension) 05/20/2011  ? OA (osteoarthritis) 05/20/2011  ? Varicose veins 05/20/2011  ? ? ?REFERRING DIAG: Spondylosothesis ? ?THERAPY DIAG:  ?Low back pain, unspecified back pain laterality, unspecified chronicity, unspecified whether sciatica present ? ?PERTINENT HISTORY: stenosis, anterolisthesis ? ?PRECAUTIONS: none ? ?SUBJECTIVE: Patient says she is doing alright today. Not too much pain right now. Was stiff this morning but once up and moving got better. Feels exercises are helping.  ? ?PAIN:  ?PAIN:  ?Are you having pain? No ? ? ? ?OBJECTIVE:  ?  ?DIAGNOSTIC FINDINGS:  ?IMPRESSION: ?1. Severe L3-4 facet arthrosis with grade 1 anterolisthesis and mild ?spinal, lateral recess, and neural foraminal stenosis. ?2. Mild lateral recess and neural foraminal stenosis at L4-5. ?3. Mild left neural foraminal stenosis at L5-S1. ?   ?  ?TODAY'S TREATMENT  ?05/07/21 ?Nustep 5 min dynamic warmup lv1 seat 8 ?Heel raise x20 ?Step up 4  inch x30 each HHA x 2 ?Step down 4 inch x 30 each HHA x 1 ?Standing hip abduction x20 each  ?Band rows GTB 2 x 15 ?Band extension GTB 2 x 15 ?Palloff press BTB x30 each ?SKTC 5 x 5" ?DKTC 5 x5" ? ?05/05/21: ? Supine: - core marching 2 x 20 with cue for abdominal activation ?  - LTR with cue for hold down and not to rush 2 x 20  ?  - DKTC reverse crunch 2 x 10  hold for 3 seconds  ?  - SLR B 2 x 10  cue for quad activation with core activation ?  - Sidelying hip abduction 2 x 10 with cue for core activation, no abdominal rolling ? Seated:  - big blue ball roll out lumbar flexion stretch 2 x 10  with cue for 5 second  hold ? Sit to stand  2 x 5 reps  ? Standing: - Unilateral farmer's carry 10lb dumbbell in hand 4 x 75 feet alternating hands ?  - Stair training exercises = 5 time R leg lift to tap on step with cueing for balance and core activation;  5 time same L ?  - Step up 5 times R with full lift and then return, same on L ?  - Up stairs 1 x with cue for continued muscle activation, step to pattern but cued to alternate ?  - Down stairs patient chose step to leading with R leg ? NuStep x 5 mins end of session, seat and arms 10, level 1   ? ?04/30/21: ? Aerobic: ?-NuStep x80mn(50-70spm) ?Supine: ?-Bridge w/ belt for hip abd 4x8 ?Standing: ?-Banded(red) supermans w/ slow march 5x30s ?-Unilateral farmer's carry 16lbs + BTB 3x748feach ? ? ?04/28/21 ?Hip excursions 5X each direction ?Hamstring stretch with towel 3X30" each LE  ?SKTC 2x 30" ?Bent knee raise 10x 3" with ab set ?Bridge 2X10 ?SLR 2X10 each ?Sidelying: hip abduction bilaterally 2X10         ?Quadruped: cat/cow 5x 10" ?  ?  ?PATIENT EDUCATION:  ?Education details: on evaluation findings, POC and HEP  ?Person educated: Patient ?Education method: Explanation and Demonstration ?Education comprehension: verbalized understanding and returned demonstration ?  ?  ?HOME EXERCISE PROGRAM: ?Access Code: [email or web address] ?URL: [email or web address] ?Date: 05/07/2021 ?Prepared by: CaJosue Hector ?Exercises ?- Standing Hip Abduction with Counter Support  - 2 x daily - 7 x weekly - 2 sets - 10 reps ?- Heel Raises with Counter Support  - 2 x daily - 7 x weekly - 2 sets - 10 repsAccess Code: [email or web address]: [email or web address] ?Date: 04/16/2021 ?Prepared by: CaJosue Hector  ?Exercises ?Supine Bridge - 2-3 x daily - 7 x weekly - 1-2 sets - 10 reps - 5 second hold ?Supine Transversus Abdominis Bracing - Hands on Stomach - 2-3 x daily - 7 x weekly - 1-2 sets - 10 reps - 5 second hold ?  ?3/29:  Access Code:  [email or web address] ?Prepared by: AmRoseanne Renolong sitting hamstring stretch, SLR, sidelying hip abduction and sidelying clams ?  ?ASSESSMENT: ?  ?CLINICAL IMPRESSION: ?Progressed functional LE and core strengthening with added step ups and palloff pressing. Patient did well overall but does note fatigue in lumbar area with increased exercise. Patient cued on proper form and mechanics with added activity. Cued on upright posturing with standing rows and extension using therabands. Ended session with lumbar flexion to address postural issues. Patient will continue to benefit from skilled therapy services to reduce  remaining deficits and improve functional ability.  ? ?  ?OBJECTIVE IMPAIRMENTS Abnormal gait, decreased activity tolerance, decreased mobility, difficulty walking, decreased ROM, decreased strength, increased fascial restrictions, increased muscle spasms, impaired flexibility, improper body mechanics, and pain.  ?  ?  ?GOALS: ?  ?SHORT TERM GOALS: Target date: 04/30/2021 ?  ?Patient will be independent with initial HEP and self-management strategies to improve functional outcomes ?Baseline:  ?Goal status: ongoing  ?  ?  ?LONG TERM GOALS: Target date: 05/28/2021 ?  ?Patient will be independent with advanced HEP and self-management strategies to improve functional outcomes ?Baseline:  ?Goal status: ongoing ?  ?2.  Patient will improve FOTO score to predicted value to indicate improvement in functional outcomes ?Baseline:  ?Goal status: ongoing ?  ?3.  Patient will report reduction of back pain to <3/10 for improved quality of life and ability to perform ADLs  ?Baseline:  ?Goal status: ongoing ?  ?4. Patient will have equal to or > 4+/5 MMT throughout BLE to improve ability to perform functional mobility, stair ambulation and ADLs.  ?Baseline:  ?Goal status: ongoing ?  ?PLAN: ?PT FREQUENCY: 2x/week ?  ?PT DURATION: 6 weeks ?  ?PLANNED INTERVENTIONS: Therapeutic exercises,  Therapeutic activity, Neuromuscular re-education, Balance training, Gait training, Patient/Family education, Joint manipulation, Joint mobilization, Stair training, Aquatic Therapy, Dry Needling, Electrical stimulation, Spinal manipulation, Spinal mobilization, Cryothe

## 2021-05-12 ENCOUNTER — Encounter (HOSPITAL_COMMUNITY): Payer: Self-pay | Admitting: Physical Therapy

## 2021-05-12 ENCOUNTER — Ambulatory Visit (HOSPITAL_COMMUNITY): Payer: Medicare HMO | Admitting: Physical Therapy

## 2021-05-12 DIAGNOSIS — M4316 Spondylolisthesis, lumbar region: Secondary | ICD-10-CM | POA: Diagnosis not present

## 2021-05-12 DIAGNOSIS — M48061 Spinal stenosis, lumbar region without neurogenic claudication: Secondary | ICD-10-CM | POA: Diagnosis not present

## 2021-05-12 DIAGNOSIS — M545 Low back pain, unspecified: Secondary | ICD-10-CM

## 2021-05-12 NOTE — Therapy (Signed)
?OUTPATIENT PHYSICAL THERAPY TREATMENT NOTE ? ? ?Patient Name: Betty Dawson ?MRN: [id number] ?DOB:21-May-1955, 66 y.o., female ?Today's Date: 05/12/2021 ? ?PCP: Lindell Spar, MD ?REFERRING PROVIDER: Lindell Spar, MD ? ?END OF SESSION:  ? PT End of Session - 05/12/21 0911   ? ? Visit Number 8   ? Number of Visits 12   ? Date for PT Re-Evaluation 05/28/21   ? Authorization Type [employer] ; cohere approved 12 visits 3/22-5/13   ? Authorization - Visit Number 8   ? Authorization - Number of Visits 12   ? Progress Note Due on Visit 10   ? PT Start Time [id number]   ? PT Stop Time 0944   ? PT Time Calculation (min) 38 min   ? Activity Tolerance Patient tolerated treatment well   ? Behavior During Therapy [facility] for tasks assessed/performed   ? ?  ?  ? ?  ? ? ?Past Medical History:  ?Diagnosis Date  ? Anemia   ? Arthritis   ? OA multiple joints  ? Heart murmur   ? Heel spur   ? Hypertension   ? Varicose veins   ? ?Past Surgical History:  ?Procedure Laterality Date  ? ABDOMINAL HYSTERECTOMY    ? endometriosis  ? BREAST BIOPSY Right 2017  ? x2  ? COLONOSCOPY  02/21/2013  ? EHM:CNOBSJ polyp measuring 6 mm in size was found in the distal/transverse colon; polypectomy was performed using snare cautery/The colon was redundant/The colon mucosa was otherwise normal/Normal mucosa in the terminal ileum  ? COLONOSCOPY N/A 02/21/2013  ? Procedure: COLONOSCOPY;  Surgeon: Danie Binder, MD;  [facility];  Service: Endoscopy;  Laterality: N/A;  10:30 AM  ? FOOT SURGERY Right   ? heel spur  ? SHOULDER ARTHROSCOPY WITH ROTATOR CUFF REPAIR Right 01/30/2016  ? Procedure: SHOULDER ARTHROSCOPY WITH ACROMIOPLASTY;  Surgeon: Carole Civil, MD;  Location: [facility];  Service: Orthopedics;  Laterality: Right;  pt knows to arrive at 7:15  ? SHOULDER OPEN ROTATOR CUFF REPAIR Right 01/30/2016  ? Procedure: ROTATOR CUFF REPAIR SHOULDER OPEN;  Surgeon: Carole Civil, MD;  Location: [facility];  Service: Orthopedics;  Laterality: Right;   ? ?Patient Active Problem List  ? Diagnosis Date Noted  ? Encounter for general adult medical examination with abnormal findings 09/18/2020  ? Neck pain 09/18/2020  ? DDD (degenerative disc disease), lumbar 05/05/2019  ? Anemia, iron deficiency 10/30/2016  ? Complete tear of right rotator cuff   ? Subacromial impingement of right shoulder   ? Ankle pain, chronic 06/02/2011  ? HTN (hypertension) 05/20/2011  ? OA (osteoarthritis) 05/20/2011  ? Varicose veins 05/20/2011  ? ? ?REFERRING DIAG: Spondylosothesis ? ?THERAPY DIAG:  ?Low back pain, unspecified back pain laterality, unspecified chronicity, unspecified whether sciatica present ? ?PERTINENT HISTORY: stenosis, anterolisthesis ? ?PRECAUTIONS: none ? ?SUBJECTIVE: Patient states heel raises made her ankle swell last time. Her back hurts still, feels it on different sides depending on activity. Feels about the same since starting therapy.  ? ? PAIN:  ?Are you having pain? Yes: NPRS scale: 7/10 ?Pain location: low back ?Pain description: aching  ?Aggravating factors: WB ?Relieving factors: non WB  ? ? ? ? ?OBJECTIVE:  ?  ?DIAGNOSTIC FINDINGS:  ?IMPRESSION: ?1. Severe L3-4 facet arthrosis with grade 1 anterolisthesis and mild ?spinal, lateral recess, and neural foraminal stenosis. ?2. Mild lateral recess and neural foraminal stenosis at L4-5. ?3. Mild left neural foraminal stenosis at L5-S1. ?   ?  ?  TODAY'S TREATMENT  ?05/12/21 ?Nu step 5 min seat 8 lv2 ?SKTC 10 x 5"  ?DKTC on ball x20  ?LTR on ball x20 ?Bridge 2 x 10 ?Standing hip abduction YTB 2 x 10 each  ? ?05/07/21 ?Nustep 5 min dynamic warmup lv1 seat 8 ?Heel raise x20 ?Step up 4 inch x30 each HHA x 2 ?Step down 4 inch x 30 each HHA x 1 ?Standing hip abduction x20 each  ?Band rows GTB 2 x 15 ?Band extension GTB 2 x 15 ?Palloff press BTB x30 each ?SKTC 5 x 5" ?DKTC 5 x5" ? ?05/05/21: ? Supine: - core marching 2 x 20 with cue for abdominal activation ?  - LTR with cue for hold down and not to rush 2 x 20  ?  - DKTC  reverse crunch 2 x 10  hold for 3 seconds  ?  - SLR B 2 x 10  cue for quad activation with core activation ?  - Sidelying hip abduction 2 x 10 with cue for core activation, no abdominal rolling ? Seated:  - big blue ball roll out lumbar flexion stretch 2 x 10  with cue for 5 second hold ? Sit to stand  2 x 5 reps  ? Standing: - Unilateral farmer's carry 10lb dumbbell in hand 4 x 75 feet alternating hands ?  - Stair training exercises = 5 time R leg lift to tap on step with cueing for balance and core activation;  5 time same L ?  - Step up 5 times R with full lift and then return, same on L ?  - Up stairs 1 x with cue for continued muscle activation, step to pattern but cued to alternate ?  - Down stairs patient chose step to leading with R leg ? NuStep x 5 mins end of session, seat and arms 10, level 1   ? ? ?  ?PATIENT EDUCATION:  ?Education details: on exercise form and function  ?Person educated: Patient ?Education method: Explanation and Demonstration ?Education comprehension: verbalized understanding and returned demonstration ?  ?  ?HOME EXERCISE PROGRAM: ?4/17/23Access Code: [email or web address] ?URL: [email or web address] ?Date: 05/12/2021 ?Prepared by: Josue Hector ? ?Exercises ?- Hip Abduction with Resistance Loop  - 2 x daily - 7 x weekly - 2 sets - 10 repsAccess Code: [email or web address] ?URL: [email or web address] ?Date: 05/07/2021 ?Prepared by: Josue Hector ? ?Exercises ?- Standing Hip Abduction with Counter Support  - 2 x daily - 7 x weekly - 2 sets - 10 reps ?- Heel Raises with Counter Support  - 2 x daily - 7 x weekly - 2 sets - 10 repsAccess Code: [email or web address] ?URL: [email or web address] ?Date: 04/16/2021 ?Prepared by: Josue Hector ?  ?Exercises ?Supine Bridge - 2-3 x daily - 7 x weekly - 1-2 sets - 10 reps - 5 second hold ?Supine Transversus Abdominis Bracing - Hands on Stomach - 2-3 x daily - 7 x weekly - 1-2 sets - 10 reps - 5 second hold ?  ?3/29:  Access Code:  [email or web address] ?Prepared by: Roseanne Reno  long sitting hamstring stretch, SLR, sidelying hip abduction and sidelying clams ?  ?ASSESSMENT: ?  ?CLINICAL IMPRESSION: ?Patient continues to be pain limited with activity. Pain appears to be more centered around LT hip, though patient describing it as back pain. She does note history of hip problems and known OA. Graded activities per patient tolerance. Added band resistance to hip abduction for LE and core strengthening. Patient will continue to benefit from skilled therapy services to  reduce remaining deficits and improve functional ability.  ? ? ?  ?OBJECTIVE IMPAIRMENTS Abnormal gait, decreased activity tolerance, decreased mobility, difficulty walking, decreased ROM, decreased strength, increased fascial restrictions, increased muscle spasms, impaired flexibility, improper body mechanics, and pain.  ?  ?  ?GOALS: ?  ?SHORT TERM GOALS: Target date: 04/30/2021 ?  ?Patient will be independent with initial HEP and self-management strategies to improve functional outcomes ?Baseline:  ?Goal status: ongoing  ?  ?  ?LONG TERM GOALS: Target date: 05/28/2021 ?  ?Patient will be independent with advanced HEP and self-management strategies to improve functional outcomes ?Baseline:  ?Goal status: ongoing ?  ?2.  Patient will improve FOTO score to predicted value to indicate improvement in functional outcomes ?Baseline:  ?Goal status: ongoing ?  ?3.  Patient will report reduction of back pain to <3/10 for improved quality of life and ability to perform ADLs  ?Baseline:  ?Goal status: ongoing ?  ?4. Patient will have equal to or > 4+/5 MMT throughout BLE to improve ability to perform functional mobility, stair ambulation and ADLs.  ?Baseline:  ?Goal status: ongoing ?  ?PLAN: ?PT FREQUENCY: 2x/week ?  ?PT DURATION: 6 weeks ?  ?PLANNED INTERVENTIONS: Therapeutic exercises, Therapeutic activity, Neuromuscular re-education, Balance training, Gait  training, Patient/Family education, Joint manipulation, Joint mobilization, Stair training, Aquatic Therapy, Dry Needling, Electrical stimulation, Spinal manipulation, Spinal mobilization, Cryotherapy, Moist heat, scar

## 2021-05-14 ENCOUNTER — Ambulatory Visit (HOSPITAL_COMMUNITY): Payer: Medicare HMO

## 2021-05-14 ENCOUNTER — Encounter (HOSPITAL_COMMUNITY): Payer: Self-pay

## 2021-05-14 DIAGNOSIS — M4316 Spondylolisthesis, lumbar region: Secondary | ICD-10-CM | POA: Diagnosis not present

## 2021-05-14 DIAGNOSIS — M48061 Spinal stenosis, lumbar region without neurogenic claudication: Secondary | ICD-10-CM | POA: Diagnosis not present

## 2021-05-14 DIAGNOSIS — M545 Low back pain, unspecified: Secondary | ICD-10-CM

## 2021-05-14 NOTE — Therapy (Signed)
?OUTPATIENT PHYSICAL THERAPY TREATMENT NOTE ? ? ?Patient Name: Betty Dawson ?MRN: [id number] ?DOB:Feb 11, 1955, 66 y.o., female ?Today's Date: 05/14/2021 ? ?PCP: Lindell Spar, MD ?REFERRING PROVIDER: Vallarie Mare, MD ? ?END OF SESSION:  ? PT End of Session - 05/14/21 1029   ? ? Visit Number 9   ? Number of Visits 12   ? Date for PT Re-Evaluation 05/28/21   ? Authorization Type [employer] ; cohere approved 12 visits 3/22-5/13   ? Authorization - Visit Number 9   ? Authorization - Number of Visits 12   ? Progress Note Due on Visit 10   ? PT Start Time [phone]   ? PT Stop Time [phone]   ? PT Time Calculation (min) 39 min   ? Activity Tolerance Patient tolerated treatment well   ? Behavior During Therapy [facility] for tasks assessed/performed   ? ?  ?  ? ?  ? ? ? ?Past Medical History:  ?Diagnosis Date  ? Anemia   ? Arthritis   ? OA multiple joints  ? Heart murmur   ? Heel spur   ? Hypertension   ? Varicose veins   ? ?Past Surgical History:  ?Procedure Laterality Date  ? ABDOMINAL HYSTERECTOMY    ? endometriosis  ? BREAST BIOPSY Right 2017  ? x2  ? COLONOSCOPY  02/21/2013  ? AST:MHDQQI polyp measuring 6 mm in size was found in the distal/transverse colon; polypectomy was performed using snare cautery/The colon was redundant/The colon mucosa was otherwise normal/Normal mucosa in the terminal ileum  ? COLONOSCOPY N/A 02/21/2013  ? Procedure: COLONOSCOPY;  Surgeon: Danie Binder, MD;  [facility];  Service: Endoscopy;  Laterality: N/A;  10:30 AM  ? FOOT SURGERY Right   ? heel spur  ? SHOULDER ARTHROSCOPY WITH ROTATOR CUFF REPAIR Right 01/30/2016  ? Procedure: SHOULDER ARTHROSCOPY WITH ACROMIOPLASTY;  Surgeon: Carole Civil, MD;  Location: [facility];  Service: Orthopedics;  Laterality: Right;  pt knows to arrive at 7:15  ? SHOULDER OPEN ROTATOR CUFF REPAIR Right 01/30/2016  ? Procedure: ROTATOR CUFF REPAIR SHOULDER OPEN;  Surgeon: Carole Civil, MD;  Location: [facility];  Service: Orthopedics;  Laterality:  Right;  ? ?Patient Active Problem List  ? Diagnosis Date Noted  ? Encounter for general adult medical examination with abnormal findings 09/18/2020  ? Neck pain 09/18/2020  ? DDD (degenerative disc disease), lumbar 05/05/2019  ? Anemia, iron deficiency 10/30/2016  ? Complete tear of right rotator cuff   ? Subacromial impingement of right shoulder   ? Ankle pain, chronic 06/02/2011  ? HTN (hypertension) 05/20/2011  ? OA (osteoarthritis) 05/20/2011  ? Varicose veins 05/20/2011  ? ? ?REFERRING DIAG: Spondylosothesis ? ?THERAPY DIAG:  ?Low back pain, unspecified back pain laterality, unspecified chronicity, unspecified whether sciatica present ? ?PERTINENT HISTORY: stenosis, anterolisthesis ? ?PRECAUTIONS: none ? ?SUBJECTIVE: Pt stated she is feeling a little better today, pain scale 5/10.  Has began walking program, difficult going up hill. ? ? PAIN:  ?Are you having pain? Yes: NPRS scale: 5/10 ?Pain location: low back ?Pain description: aching  ?Aggravating factors: WB ?Relieving factors: non WB  ? ? ? ? ?OBJECTIVE:  ?  ?DIAGNOSTIC FINDINGS:  ?IMPRESSION: ?1. Severe L3-4 facet arthrosis with grade 1 anterolisthesis and mild ?spinal, lateral recess, and neural foraminal stenosis. ?2. Mild lateral recess and neural foraminal stenosis at L4-5. ?3. Mild left neural foraminal stenosis at L5-S1. ?   ?  ?TODAY'S TREATMENT  ?05/14/21: ?Standing:  ?  3D hip excursion 10x ?10 STS cueing for mechanics ?Hip abd 10x ?Hip extension 10x ?Heel/toe raises 10x  ?Marching with intermittent HHA 10x 3" ?SLS 4" max BLE ?GTB shoulder extension and rows 10x 2 sets each ? ?Supine: DKTC on ball 20x ? ?05/12/21 ?Nu step 5 min seat 8 lv2 ?SKTC 10 x 5"  ?DKTC on ball x20  ?LTR on ball x20 ?Bridge 2 x 10 ?Standing hip abduction YTB 2 x 10 each  ? ?05/07/21 ?Nustep 5 min dynamic warmup lv1 seat 8 ?Heel raise x20 ?Step up 4 inch x30 each HHA x 2 ?Step down 4 inch x 30 each HHA x 1 ?Standing hip abduction x20 each  ?Band rows GTB 2 x 15 ?Band extension  GTB 2 x 15 ?Palloff press BTB x30 each ?SKTC 5 x 5" ?DKTC 5 x5" ? ?05/05/21: ? Supine: - core marching 2 x 20 with cue for abdominal activation ?  - LTR with cue for hold down and not to rush 2 x 20  ?  - DKTC reverse crunch 2 x 10  hold for 3 seconds  ?  - SLR B 2 x 10  cue for quad activation with core activation ?  - Sidelying hip abduction 2 x 10 with cue for core activation, no abdominal rolling ? Seated:  - big blue ball roll out lumbar flexion stretch 2 x 10  with cue for 5 second hold ? Sit to stand  2 x 5 reps  ? Standing: - Unilateral farmer's carry 10lb dumbbell in hand 4 x 75 feet alternating hands ?  - Stair training exercises = 5 time R leg lift to tap on step with cueing for balance and core activation;  5 time same L ?  - Step up 5 times R with full lift and then return, same on L ?  - Up stairs 1 x with cue for continued muscle activation, step to pattern but cued to alternate ?  - Down stairs patient chose step to leading with R leg ? NuStep x 5 mins end of session, seat and arms 10, level 1   ? ? ?  ?PATIENT EDUCATION:  ?Education details: on exercise form and function  ?Person educated: Patient ?Education method: Explanation and Demonstration ?Education comprehension: verbalized understanding and returned demonstration ?  ?  ?HOME EXERCISE PROGRAM: ?4/17/23Access Code: [email or web address] ?URL: [email or web address] ?Date: 05/12/2021 ?Prepared by: Josue Hector ? ?Exercises ?- Hip Abduction with Resistance Loop  - 2 x daily - 7 x weekly - 2 sets - 10 repsAccess Code: [email or web address] ?URL: [email or web address] ?Date: 05/07/2021 ?Prepared by: Josue Hector ? ?Exercises ?- Standing Hip Abduction with Counter Support  - 2 x daily - 7 x weekly - 2 sets - 10 reps ?- Heel Raises with Counter Support  - 2 x daily - 7 x weekly - 2 sets - 10 repsAccess Code: [email or web address] ?URL: [email or web address] ?Date: 04/16/2021 ?Prepared by: Josue Hector ?  ?Exercises ?Supine Bridge - 2-3 x  daily - 7 x weekly - 1-2 sets - 10 reps - 5 second hold ?Supine Transversus Abdominis Bracing - Hands on Stomach - 2-3 x daily - 7 x weekly - 1-2 sets - 10 reps - 5 second hold ?  ?3/29:  Access Code: [email or web address] ?[email or web address] ?Prepared by: Roseanne Reno  long sitting hamstring stretch, SLR, sidelying hip abduction and sidelying clams ?  ?ASSESSMENT: ?  ?CLINICAL IMPRESSION: ?Progressed hip strengthening with additional standing exercises.  Pt able to complete good mechanics with new activities, with  min cueing for abdominal sets with task.  Pt mentioned incline slope difficult with walking program yesterday.  Added balance activities for stability with ability to SLS 4" max prior LOB required UE support.  No reports of increased pain through session. ? ? ?  ?OBJECTIVE IMPAIRMENTS Abnormal gait, decreased activity tolerance, decreased mobility, difficulty walking, decreased ROM, decreased strength, increased fascial restrictions, increased muscle spasms, impaired flexibility, improper body mechanics, and pain.  ?  ?  ?GOALS: ?  ?SHORT TERM GOALS: Target date: 04/30/2021 ?  ?Patient will be independent with initial HEP and self-management strategies to improve functional outcomes ?Baseline:  ?Goal status: ongoing  ?  ?  ?LONG TERM GOALS: Target date: 05/28/2021 ?  ?Patient will be independent with advanced HEP and self-management strategies to improve functional outcomes ?Baseline:  ?Goal status: ongoing ?  ?2.  Patient will improve FOTO score to predicted value to indicate improvement in functional outcomes ?Baseline:  ?Goal status: ongoing ?  ?3.  Patient will report reduction of back pain to <3/10 for improved quality of life and ability to perform ADLs  ?Baseline:  ?Goal status: ongoing ?  ?4. Patient will have equal to or > 4+/5 MMT throughout BLE to improve ability to perform functional mobility, stair ambulation and ADLs.  ?Baseline:  ?Goal status: ongoing ?  ?PLAN: ?PT FREQUENCY: 2x/week ?   ?PT DURATION: 6 weeks ?  ?PLANNED INTERVENTIONS: Therapeutic exercises, Therapeutic activity, Neuromuscular re-education, Balance training, Gait training, Patient/Family education, Joint manipulation, Joint

## 2021-05-19 ENCOUNTER — Encounter (HOSPITAL_COMMUNITY): Payer: Self-pay

## 2021-05-19 ENCOUNTER — Ambulatory Visit (HOSPITAL_COMMUNITY): Payer: Medicare HMO

## 2021-05-19 DIAGNOSIS — M48061 Spinal stenosis, lumbar region without neurogenic claudication: Secondary | ICD-10-CM | POA: Diagnosis not present

## 2021-05-19 DIAGNOSIS — M545 Low back pain, unspecified: Secondary | ICD-10-CM

## 2021-05-19 DIAGNOSIS — M4316 Spondylolisthesis, lumbar region: Secondary | ICD-10-CM | POA: Diagnosis not present

## 2021-05-19 NOTE — Therapy (Signed)
?OUTPATIENT PHYSICAL THERAPY  PROGRESS NOTE ? ? ?Patient Name: Betty Dawson ?MRN: [id number] ?DOB:[birth year], 66 y.o., female ?Today's Date: 05/19/2021 ? ?PCP: Lindell Spar, MD ?REFERRING PROVIDER: Vallarie Mare, MD ? ?Progress Note  ? ?Reporting Period 04/16/21 to 05/19/21 ? ? See note below for Objective Data and Assessment of Progress/Goals  ? ?END OF SESSION:  ? PT End of Session - 05/19/21 0948   ? ? Visit Number 10   ? Number of Visits 12   ? Date for PT Re-Evaluation 05/28/21   ? Authorization Type [employer] ; cohere approved 12 visits 3/22-5/13   ? Authorization - Visit Number 10   ? Authorization - Number of Visits 12   ? Progress Note Due on Visit 10   ? PT Start Time 2536   ? PT Stop Time 1025   ? PT Time Calculation (min) 40 min   ? Activity Tolerance Patient tolerated treatment well   ? Behavior During Therapy [facility] for tasks assessed/performed   ? ?  ?  ? ?  ? ? ? ?Past Medical History:  ?Diagnosis Date  ? Anemia   ? Arthritis   ? OA multiple joints  ? Heart murmur   ? Heel spur   ? Hypertension   ? Varicose veins   ? ?Past Surgical History:  ?Procedure Laterality Date  ? ABDOMINAL HYSTERECTOMY    ? endometriosis  ? BREAST BIOPSY Right 2017  ? x2  ? COLONOSCOPY  02/21/2013  ? UYQ:IHKVQQ polyp measuring 6 mm in size was found in the distal/transverse colon; polypectomy was performed using snare cautery/The colon was redundant/The colon mucosa was otherwise normal/Normal mucosa in the terminal ileum  ? COLONOSCOPY N/A 02/21/2013  ? Procedure: COLONOSCOPY;  Surgeon: Danie Binder, MD;  [facility];  Service: Endoscopy;  Laterality: N/A;  10:30 AM  ? FOOT SURGERY Right   ? heel spur  ? SHOULDER ARTHROSCOPY WITH ROTATOR CUFF REPAIR Right 01/30/2016  ? Procedure: SHOULDER ARTHROSCOPY WITH ACROMIOPLASTY;  Surgeon: Carole Civil, MD;  Location: [facility];  Service: Orthopedics;  Laterality: Right;  pt knows to arrive at 7:15  ? SHOULDER OPEN ROTATOR CUFF REPAIR Right 01/30/2016  ?  Procedure: ROTATOR CUFF REPAIR SHOULDER OPEN;  Surgeon: Carole Civil, MD;  Location: [facility];  Service: Orthopedics;  Laterality: Right;  ? ?Patient Active Problem List  ? Diagnosis Date Noted  ? Encounter for general adult medical examination with abnormal findings 09/18/2020  ? Neck pain 09/18/2020  ? DDD (degenerative disc disease), lumbar 05/05/2019  ? Anemia, iron deficiency 10/30/2016  ? Complete tear of right rotator cuff   ? Subacromial impingement of right shoulder   ? Ankle pain, chronic 06/02/2011  ? HTN (hypertension) 05/20/2011  ? OA (osteoarthritis) 05/20/2011  ? Varicose veins 05/20/2011  ? ? ?REFERRING DIAG: Spondylosothesis ? ?THERAPY DIAG:  ?Low back pain, unspecified back pain laterality, unspecified chronicity, unspecified whether sciatica present ? ?PERTINENT HISTORY: stenosis, anterolisthesis ? ?PRECAUTIONS: none ? ?SUBJECTIVE: Pt continues to have back pain, however feeling 50% improved and "knows what to do when it hurts" Reports doing well with HEP and when 12 visits are done, asks to finish with PT and just continue with HEP.  ? ? PAIN:  ?Are you having pain? Yes: NPRS scale: 5/10 ?Pain location: low back B ?Pain description: aching  ?Aggravating factors: WB standing ?Relieving factors: non WB  ? ? ? ? ?OBJECTIVE:  Italic from evaluation 5/95/63 and Progress Note information  in bold  ?  ?DIAGNOSTIC FINDINGS:  ?IMPRESSION: ?1. Severe L3-4 facet arthrosis with grade 1 anterolisthesis and mild ?spinal, lateral recess, and neural foraminal stenosis. ?2. Mild lateral recess and neural foraminal stenosis at L4-5. ?3. Mild left neural foraminal stenosis at L5-S1. ? ?PATIENT SURVEYS:  ?At eval = FOTO 40% function ?Today 05/19/21 = 52% function  ?  ?POSTURE:  ?Increased lordosis (lumbar)  Continues ?  ?PALPATION: ?No notable tenderness in hips or lumbar paraspinals  ?  ?LUMBAR ROM:  ?  ?Active  A/PROM  ?04/16/2021 AROM  ?05/19/2021  ?Flexion WNL WNL  ?Extension 80% limited 50% limited*  ?Right  lateral flexion 50% limited 50% limited  ?Left lateral flexion 50% limited 25% limited  ?Right rotation     ?Left rotation     ? (Blank rows = not tested) ?  ?  ?  ?LE MMT: ?  ?MMT Right ?04/16/2021 Left ?04/16/2021 Right  ?05/19/21 Left ?05/19/21  ?Hip flexion 4+ 4+ 5 5  ?Hip extension 3- 3- 3 3  ?Hip abduction 3 3- 4 4-  ?Hip adduction        ?Hip internal rotation        ?Hip external rotation        ?Knee flexion        ?Knee extension 4 4 4+ 4+  ?Ankle dorsiflexion '4 4 4 4  '$ ?Ankle plantarflexion        ?Ankle inversion        ?Ankle eversion        ? (Blank rows = not tested) ?  ?  ?   ?  ?TODAY'S TREATMENT  ?05/19/21:  Pre-assessment for progress note, measurements ? Standing =  ?  Heel raises 10 x 3 second hold  ?  Marching with intermittent HHA 20 repts x 1 set then 10 x 3 second hold x 1 set ?  Hip extension 2 x 10 reps B  ?  Hip abduction 2 x 10 reps B  ?  Narrow BOS balance x 30 seconds ?  Tandem L balance x 30 seconds ?  Tandem R balance x 30 seconds ?  Single leg balance x 10 second B with HHA on // bars ?  Band rows x 10 reps x 2 verse green band ?  Band shoulder extensions x 10 reps x 2 sets verse green band ?  Paloff press x 10 reps B x 1 set  ?  Step up at stairs x 10 reps B leading ?   ?   ?  ?05/14/21: ?Standing:  ?3D hip excursion 10x ?10 STS cueing for mechanics ?Hip abd 10x ?Hip extension 10x ?Heel/toe raises 10x  ?Marching with intermittent HHA 10x 3" ?SLS 4" max BLE ?GTB shoulder extension and rows 10x 2 sets each ? ?Supine: DKTC on ball 20x ? ?05/12/21 ?Nu step 5 min seat 8 lv2 ?SKTC 10 x 5"  ?DKTC on ball x20  ?LTR on ball x20 ?Bridge 2 x 10 ?Standing hip abduction YTB 2 x 10 each  ? ?05/07/21 ?Nustep 5 min dynamic warmup lv1 seat 8 ?Heel raise x20 ?Step up 4 inch x30 each HHA x 2 ?Step down 4 inch x 30 each HHA x 1 ?Standing hip abduction x20 each  ?Band rows GTB 2 x 15 ?Band extension GTB 2 x 15 ?Palloff press BTB x30 each ?SKTC 5 x 5" ?DKTC 5 x5" ? ?05/05/21: ? Supine: - core marching 2 x 20 with  cue for abdominal activation ?  -  LTR with cue for hold down and not to rush 2 x 20  ?  - DKTC reverse crunch 2 x 10  hold for 3 seconds  ?  - SLR B 2 x 10  cue for quad activation with core activation ?  - Sidelying hip abduction 2 x 10 with cue for core activation, no abdominal rolling ? Seated:  - big blue ball roll out lumbar flexion stretch 2 x 10  with cue for 5 second hold ? Sit to stand  2 x 5 reps  ? Standing: - Unilateral farmer's carry 10lb dumbbell in hand 4 x 75 feet alternating hands ?  - Stair training exercises = 5 time R leg lift to tap on step with cueing for balance and core activation;  5 time same L ?  - Step up 5 times R with full lift and then return, same on L ?  - Up stairs 1 x with cue for continued muscle activation, step to pattern but cued to alternate ?  - Down stairs patient chose step to leading with R leg ? NuStep x 5 mins end of session, seat and arms 10, level 1   ? ? ?  ?PATIENT EDUCATION:  ?Education details: on exercise form and function 05/19/21 - progress since evaluation, next steps, HEP review  ?Person educated: Patient ?Education method: Explanation and Demonstration ?Education comprehension: verbalized understanding and returned demonstration ?  ?  ?HOME EXERCISE PROGRAM: ?4/17/23Access Code: [email or web address] ?URL: [email or web address] ?Date: 05/12/2021 ?Prepared by: Josue Hector ? ?Exercises ?- Hip Abduction with Resistance Loop  - 2 x daily - 7 x weekly - 2 sets - 10 repsAccess Code: [email or web address] ?URL: [email or web address] ?Date: 05/07/2021 ?Prepared by: Josue Hector ? ?Exercises ?- Standing Hip Abduction with Counter Support  - 2 x daily - 7 x weekly - 2 sets - 10 reps ?- Heel Raises with Counter Support  - 2 x daily - 7 x weekly - 2 sets - 10 repsAccess Code: [email or web address] ?URL: [email or web address] ?Date: 04/16/2021 ?Prepared by: Josue Hector ?  ?Exercises ?Supine Bridge - 2-3 x daily - 7 x weekly - 1-2 sets - 10 reps - 5 second  hold ?Supine Transversus Abdominis Bracing - Hands on Stomach - 2-3 x daily - 7 x weekly - 1-2 sets - 10 reps - 5 second hold ?  ?3/29:  Access Code: [email or web address] ?[email or web address] ?Prepared b

## 2021-05-19 NOTE — Patient Outreach (Signed)
Received a Nurse call center notification for Ms. Betty Dawson . ?I have assigned Enzo Montgomery, RN to call for follow up and determine if there are any Case Management needs.  ?  ?Arville Care, CBCS, CMAA ?[place] Management Assistant ?[place] Management ?[phone]   ?

## 2021-05-20 ENCOUNTER — Other Ambulatory Visit: Payer: Self-pay

## 2021-05-20 DIAGNOSIS — I10 Essential (primary) hypertension: Secondary | ICD-10-CM

## 2021-05-20 NOTE — Patient Outreach (Signed)
[facility] [facility] [facility]) Care Management ? ?05/20/2021 ? ?Versie Starks ?[birth year] ?[id number] ? ? ?Telephone Screen ? ?Referral Date:05/19/2021 ?Referral Source: Nurse Call Center ?Referral Reason: " ?Medical Condition:     High Blood Pressure ?Primary Contact:       Yes ?Best Time Contact:     morning ?Service Request:         not sure ? ? ? ?Outreach attempt # 1 to patient. Spoke with patient. She reports she received info about her being eligible for care mgmt services. States she called nurse line to get more info and to sign up. Noted that patient belongs to embedded PCP office. Advised patient that an RN CM from PCP office would be outreaching her soon. She voiced understanding and agreement. She denies any acute issues, needs or concerns.  ? ? ? ? ? ?Plan: ?RN CM will refer to embedded CCM team for follow up. ? ? ?Enzo Montgomery, RN,BSN,CCM ?[facility] Care Management ?Telephonic Care Management Coordinator ?Direct Phone: [phone] ?Toll Free: [phone] ?Fax: [phone]  ?

## 2021-05-21 ENCOUNTER — Telehealth: Payer: Self-pay | Admitting: *Deleted

## 2021-05-21 ENCOUNTER — Encounter (HOSPITAL_COMMUNITY): Payer: Self-pay | Admitting: Physical Therapy

## 2021-05-21 ENCOUNTER — Ambulatory Visit (HOSPITAL_COMMUNITY): Payer: Medicare HMO | Admitting: Physical Therapy

## 2021-05-21 DIAGNOSIS — M48061 Spinal stenosis, lumbar region without neurogenic claudication: Secondary | ICD-10-CM | POA: Diagnosis not present

## 2021-05-21 DIAGNOSIS — M545 Low back pain, unspecified: Secondary | ICD-10-CM

## 2021-05-21 DIAGNOSIS — M4316 Spondylolisthesis, lumbar region: Secondary | ICD-10-CM | POA: Diagnosis not present

## 2021-05-21 NOTE — Chronic Care Management (AMB) (Signed)
?  Care Management  ? ?Note ? ?05/21/2021 ?Name: Betty Dawson MRN: [id number] DOB: 1955-07-26 ? ?Betty Dawson is a 66 y.o. year old female who is a primary care patient of Lindell Spar, MD. I reached out to Versie Starks by phone today offer care coordination services.  ? ?Ms. Bomkamp was given information about care management services today including:  ?Care management services include personalized support from designated clinical staff supervised by her physician, including individualized plan of care and coordination with other care providers ?24/7 contact phone numbers for assistance for urgent and routine care needs. ?The patient may stop care management services at any time by phone call to the office staff. ? ?Patient agreed to services and verbal consent obtained.  ? ?Follow up plan: ?Telephone appointment with care management team member scheduled for:07/01/21 ? ?Laverda Sorenson  ?Care Guide, Embedded Care Coordination ?[place]  Care Management  ?Direct Dial: [phone] ? ?

## 2021-05-21 NOTE — Therapy (Signed)
?OUTPATIENT PHYSICAL THERAPY  PROGRESS NOTE ? ? ?Patient Name: Betty Dawson ?MRN: [id number] ?DOB:11-Nov-1955, 66 y.o., female ?Today's Date: 05/21/2021 ? ?PCP: Lindell Spar, MD ?REFERRING PROVIDER: Vallarie Mare, MD ? ? ?Reporting Period 04/16/21 to 05/19/21 ? ? See note below for Objective Data and Assessment of Progress/Goals  ? ?END OF SESSION:  ? PT End of Session - 05/21/21 0906   ? ? Visit Number 11   ? Number of Visits 12   ? Date for PT Re-Evaluation 05/28/21   ? Authorization Type [employer] ; cohere approved 12 visits 3/22-5/13   ? Authorization - Visit Number 11   ? Authorization - Number of Visits 12   ? Progress Note Due on Visit 10   ? PT Start Time [phone]   ? PT Stop Time [phone]   ? PT Time Calculation (min) 40 min   ? Activity Tolerance Patient tolerated treatment well   ? Behavior During Therapy [facility] for tasks assessed/performed   ? ?  ?  ? ?  ? ? ? ?Past Medical History:  ?Diagnosis Date  ? Anemia   ? Arthritis   ? OA multiple joints  ? Heart murmur   ? Heel spur   ? Hypertension   ? Varicose veins   ? ?Past Surgical History:  ?Procedure Laterality Date  ? ABDOMINAL HYSTERECTOMY    ? endometriosis  ? BREAST BIOPSY Right 2017  ? x2  ? COLONOSCOPY  02/21/2013  ? YIF:[id number] polyp measuring 6 mm in size was found in the distal/transverse colon; polypectomy was performed using snare cautery/The colon was redundant/The colon mucosa was otherwise normal/Normal mucosa in the terminal ileum  ? COLONOSCOPY N/A 02/21/2013  ? Procedure: COLONOSCOPY;  Surgeon: Danie Binder, MD;  [facility];  Service: Endoscopy;  Laterality: N/A;  10:30 AM  ? FOOT SURGERY Right   ? heel spur  ? SHOULDER ARTHROSCOPY WITH ROTATOR CUFF REPAIR Right 01/30/2016  ? Procedure: SHOULDER ARTHROSCOPY WITH ACROMIOPLASTY;  Surgeon: Carole Civil, MD;  Location: [facility];  Service: Orthopedics;  Laterality: Right;  pt knows to arrive at 7:15  ? SHOULDER OPEN ROTATOR CUFF REPAIR Right 01/30/2016  ? Procedure: ROTATOR  CUFF REPAIR SHOULDER OPEN;  Surgeon: Carole Civil, MD;  Location: [facility];  Service: Orthopedics;  Laterality: Right;  ? ?Patient Active Problem List  ? Diagnosis Date Noted  ? Encounter for general adult medical examination with abnormal findings 09/18/2020  ? Neck pain 09/18/2020  ? DDD (degenerative disc disease), lumbar 05/05/2019  ? Anemia, iron deficiency 10/30/2016  ? Complete tear of right rotator cuff   ? Subacromial impingement of right shoulder   ? Ankle pain, chronic 06/02/2011  ? HTN (hypertension) 05/20/2011  ? OA (osteoarthritis) 05/20/2011  ? Varicose veins 05/20/2011  ? ? ?REFERRING DIAG: Spondylosothesis ? ?THERAPY DIAG:  ?Low back pain, unspecified back pain laterality, unspecified chronicity, unspecified whether sciatica present ? ?PERTINENT HISTORY: stenosis, anterolisthesis ? ?PRECAUTIONS: none ? ?SUBJECTIVE: Patient says she is doing well today. No pain today and no questions about HEP.  ? ? PAIN:  ?Are you having pain? No ? ? ? ? ? ?OBJECTIVE:  Italic from evaluation 2/87/86 and Progress Note information in bold  ?  ?DIAGNOSTIC FINDINGS:  ?IMPRESSION: ?1. Severe L3-4 facet arthrosis with grade 1 anterolisthesis and mild ?spinal, lateral recess, and neural foraminal stenosis. ?2. Mild lateral recess and neural foraminal stenosis at L4-5. ?3. Mild left neural foraminal stenosis at L5-S1. ? ?PATIENT  SURVEYS:  ?At eval = FOTO 40% function ?Today 05/19/21 = 52% function  ?  ?POSTURE:  ?Increased lordosis (lumbar)  Continues ?  ?PALPATION: ?No notable tenderness in hips or lumbar paraspinals  ?  ?LUMBAR ROM:  ?  ?Active  A/PROM  ?04/16/2021 AROM  ?05/19/2021  ?Flexion WNL WNL  ?Extension 80% limited 50% limited*  ?Right lateral flexion 50% limited 50% limited  ?Left lateral flexion 50% limited 25% limited  ?Right rotation     ?Left rotation     ? (Blank rows = not tested) ?  ?  ?  ?LE MMT: ?  ?MMT Right ?04/16/2021 Left ?04/16/2021 Right  ?05/19/21 Left ?05/19/21  ?Hip flexion 4+ 4+ 5 5  ?Hip  extension 3- 3- 3 3  ?Hip abduction 3 3- 4 4-  ?Hip adduction        ?Hip internal rotation        ?Hip external rotation        ?Knee flexion        ?Knee extension 4 4 4+ 4+  ?Ankle dorsiflexion _0 ?Ankle plantarflexion        ?Ankle inversion        ?Ankle eversion        ? (Blank rows = not tested) ?  ?  ?   ?  ?TODAY'S TREATMENT  ?05/21/21 ?Nustep lv 3 5 min for strengthening  ?6 inch step taps x20 ?GTB hip abduction 2 x 10 ?GTB hip extension 2 x 10 ?Chair squats with red med ball hold x10  ?GTB rows/ extension 2 x 15 each  ?Tandem stance on foam 3 x 20" ?Palloff press GTB  x 15 each  ?Trunk twist with alt LE on 6 inch box holding red med ball 2 x 10 each  ? ? ?05/19/21:  Pre-assessment for progress note, measurements ? Standing =  ?  Heel raises 10 x 3 second hold  ?  Marching with intermittent HHA 20 repts x 1 set then 10 x 3 second hold x 1 set ?  Hip extension 2 x 10 reps B  ?  Hip abduction 2 x 10 reps B  ?  Narrow BOS balance x 30 seconds ?  Tandem L balance x 30 seconds ?  Tandem R balance x 30 seconds ?  Single leg balance x 10 second B with HHA on // bars ?  Band rows x 10 reps x 2 verse green band ?  Band shoulder extensions x 10 reps x 2 sets verse green band ?  Paloff press x 10 reps B x 1 set  ?  Step up at stairs x 10 reps B leading ?   ?   ?  ? ?  ?PATIENT EDUCATION:  ?Education details: on exercise form and function 05/19/21 - progress since evaluation, next steps, HEP review  ?Person educated: Patient ?Education method: Explanation and Demonstration ?Education comprehension: verbalized understanding and returned demonstration ?  ?  ?HOME EXERCISE PROGRAM: ?05/21/21 ?Access Code: [email or web address] ?Exercises ?- Standing Shoulder Row with Anchored Resistance  - 1-2 x daily - 7 x weekly - 2 sets - 15 reps ?- Shoulder extension with resistance - Neutral  - 1-2 x daily - 7 x weekly - 2 sets - 15 reps ?- Standing Anti-Rotation Press with Anchored Resistance  - 1-2 x daily - 7 x weekly - 2 sets - 15 reps ?-  Hip Abduction with Resistance Loop  - 1-2 x daily - 7 x  weekly - 2 sets - 15 reps ?- Hip Extension with Resistance Loop  - 1-2 x daily - 7 x weekly - 2 sets - 15 reps4/17/23Access Code: [email or web address] ?Date: 05/12/2021 ?Prepared by: Josue Hector ? ?Exercises ?- Hip Abduction with Resistance Loop  - 2 x daily - 7 x weekly - 2 sets - 10 repsAccess Code: [email or web address] ?URL: [email or web address] ?Date: 05/07/2021 ?Prepared by: Josue Hector ? ?Exercises ?- Standing Hip Abduction with Counter Support  - 2 x daily - 7 x weekly - 2 sets - 10 reps ?- Heel Raises with Counter Support  - 2 x daily - 7 x weekly - 2 sets - 10 repsAccess Code: [email or web address] ?URL: [email or web address] ?Date: 04/16/2021 ?Prepared by: Josue Hector ?  ?Exercises ?Supine Bridge - 2-3 x daily - 7 x weekly - 1-2 sets - 10 reps - 5 second hold ?Supine Transversus Abdominis Bracing - Hands on Stomach - 2-3 x daily - 7 x weekly - 1-2 sets - 10 reps - 5 second hold ?  ?3/29:  Access Code: [email or web address] ?[email or web address] ?Prepared by: Roseanne Reno  long sitting hamstring stretch, SLR, sidelying hip abduction and sidelying clams ?  ?ASSESSMENT: ?  ?CLINICAL IMPRESSION: ?Patient progressing well overall. Increased resistance with core strengthening activity. Added foam pad to static balance. Patient well challenged with additions noting increased fatigue. Patient cued on upright posturing and activating core with palloff press and med ball twists. Updated HEP and issued handout. Patient will continue to benefit from skilled therapy services to reduce remaining deficits and improve functional ability.  ? ? ?  ?OBJECTIVE IMPAIRMENTS Abnormal gait, decreased activity tolerance, decreased mobility, difficulty walking, decreased ROM, decreased strength, increased fascial restrictions, increased muscle spasms, impaired flexibility, improper body mechanics, and pain.  ?  ?  ?GOALS: ?  ?SHORT TERM GOALS: Target date:  04/30/2021 ?  ?Patient will be independent with initial HEP and self-management strategies to improve functional outcomes ?Baseline:  ?Goal status: MET! ?  ?  ?LONG TERM GOALS: Target date: 05/28/2021 ?  ?Patient will be in

## 2021-05-23 ENCOUNTER — Encounter: Payer: Self-pay | Admitting: Internal Medicine

## 2021-05-23 ENCOUNTER — Ambulatory Visit (INDEPENDENT_AMBULATORY_CARE_PROVIDER_SITE_OTHER): Payer: Medicare HMO | Admitting: Internal Medicine

## 2021-05-23 VITALS — BP 120/84 | HR 65 | Resp 18 | Ht 68.0 in | Wt 196.2 lb

## 2021-05-23 DIAGNOSIS — Z23 Encounter for immunization: Secondary | ICD-10-CM

## 2021-05-23 DIAGNOSIS — M5136 Other intervertebral disc degeneration, lumbar region: Secondary | ICD-10-CM | POA: Diagnosis not present

## 2021-05-23 DIAGNOSIS — E559 Vitamin D deficiency, unspecified: Secondary | ICD-10-CM | POA: Diagnosis not present

## 2021-05-23 DIAGNOSIS — I83892 Varicose veins of left lower extremities with other complications: Secondary | ICD-10-CM | POA: Diagnosis not present

## 2021-05-23 DIAGNOSIS — I10 Essential (primary) hypertension: Secondary | ICD-10-CM

## 2021-05-23 DIAGNOSIS — Z0000 Encounter for general adult medical examination without abnormal findings: Secondary | ICD-10-CM

## 2021-05-23 NOTE — Assessment & Plan Note (Signed)
Has chronic LE swelling ?Advised to use compression stockings ?Leg elevation as tolerated ?Has seen vascular surgery in the past, recommended conservative management ?

## 2021-05-23 NOTE — Assessment & Plan Note (Signed)
Chronic low back pain radiating to left LE ?On Gabapentin and PRN Tylenol ?Flexeril 10 mg PRN for muscle spasms ?MRI lumbar spine reviewed ?Has seen spine surgeon ?Undergoing physical therapy ?

## 2021-05-23 NOTE — Assessment & Plan Note (Signed)
BP Readings from Last 1 Encounters:  ?05/23/21 120/84  ? ?Well-controlled with Amlodipine and HCTZ ?Counseled for compliance with the medications ?Advised DASH diet and moderate exercise/walking, at least 150 mins/week ?

## 2021-05-23 NOTE — Patient Instructions (Signed)
Please continue taking medications as prescribed.  Please continue to follow low salt diet and perform moderate exercise/walking at least 150 mins/week.  Please get fasting blood tests done before the next visit. 

## 2021-05-23 NOTE — Progress Notes (Signed)
? ?Established Patient Office Visit ? ?Subjective:  ?Patient ID: Betty Dawson, female    DOB: 1956/01/18  Age: 66 y.o. MRN: [id number] ? ?CC:  ?Chief Complaint  ?Patient presents with  ? Follow-up  ?  4 month follow up HTN also has been doing PT for weakness in legs wanted to know if varicose veins could cause weakness in legs   ? ? ?HPI ?Betty Dawson is a 66 y.o. female with past medical history of HTN, OA, DDD of lumbar spine and iron deficiency anemia who presents for f/u of her chronic medical conditions. ? ?Chronic low back pain: She complains of chronic, intermittent low back pain, radiating to b/l LE with numbness of left foot, worse with movement and walking and better with rest.  She has been doing physical therapy for low back pain and LE weakness, which has been slowly improving.  She asks if her varicose veins are causing weakness in her legs, which is unlikely.  She has seen spine surgery for DDD of lumbar spine and was advised PT for now.  She denies any recent fall or injury. ? ?HTN: BP is well-controlled. Takes medications regularly. Patient denies headache, dizziness, chest pain, dyspnea or palpitations. ? ? ?Past Medical History:  ?Diagnosis Date  ? Anemia   ? Arthritis   ? OA multiple joints  ? Heart murmur   ? Heel spur   ? Hypertension   ? Varicose veins   ? ? ?Past Surgical History:  ?Procedure Laterality Date  ? ABDOMINAL HYSTERECTOMY    ? endometriosis  ? BREAST BIOPSY Right 2017  ? x2  ? COLONOSCOPY  02/21/2013  ? XBJ:YNWGNF polyp measuring 6 mm in size was found in the distal/transverse colon; polypectomy was performed using snare cautery/The colon was redundant/The colon mucosa was otherwise normal/Normal mucosa in the terminal ileum  ? COLONOSCOPY N/A 02/21/2013  ? Procedure: COLONOSCOPY;  Surgeon: Danie Binder, MD;  Location: [facility];  Service: Endoscopy;  Laterality: N/A;  10:30 AM  ? FOOT SURGERY Right   ? heel spur  ? SHOULDER ARTHROSCOPY WITH ROTATOR CUFF REPAIR Right  01/30/2016  ? Procedure: SHOULDER ARTHROSCOPY WITH ACROMIOPLASTY;  Surgeon: Carole Civil, MD;  Location: [facility];  Service: Orthopedics;  Laterality: Right;  pt knows to arrive at 7:15  ? SHOULDER OPEN ROTATOR CUFF REPAIR Right 01/30/2016  ? Procedure: ROTATOR CUFF REPAIR SHOULDER OPEN;  Surgeon: Carole Civil, MD;  Location: [facility];  Service: Orthopedics;  Laterality: Right;  ? ? ?Family History  ?Problem Relation Age of Onset  ? Hypertension Mother   ? Cancer Mother   ? Diabetes Sister   ? Hyperlipidemia Sister   ? Hypertension Sister   ? Diabetes Brother   ? Hypertension Brother   ? ? ?Social History  ? ?Socioeconomic History  ? Marital status: Single  ?  Spouse name: Not on file  ? Number of children: Not on file  ? Years of education: Not on file  ? Highest education level: Not on file  ?Occupational History  ? Not on file  ?Tobacco Use  ? Smoking status: Never  ? Smokeless tobacco: Never  ?Substance and Sexual Activity  ? Alcohol use: No  ? Drug use: No  ? Sexual activity: Not Currently  ?  Birth control/protection: Surgical  ?Other Topics Concern  ? Not on file  ?Social History Narrative  ? Not on file  ? ?Social Determinants of Health  ? ?Financial Resource Strain: Low  Risk   ? Difficulty of Paying Living Expenses: Not hard at all  ?Food Insecurity: No Food Insecurity  ? Worried About Charity fundraiser in the Last Year: Never true  ? Ran Out of Food in the Last Year: Never true  ?Transportation Needs: No Transportation Needs  ? Lack of Transportation (Medical): No  ? Lack of Transportation (Non-Medical): No  ?Physical Activity: Insufficiently Active  ? Days of Exercise per Week: 3 days  ? Minutes of Exercise per Session: 30 min  ?Stress: No Stress Concern Present  ? Feeling of Stress : Not at all  ?Social Connections: Moderately Isolated  ? Frequency of Communication with Friends and Family: More than three times a week  ? Frequency of Social Gatherings with Friends and Family: More than three times  a week  ? Attends Religious Services: 1 to 4 times per year  ? Active Member of Clubs or Organizations: No  ? Attends Archivist Meetings: Never  ? Marital Status: Widowed  ?Intimate Partner Violence: Not At Risk  ? Fear of Current or Ex-Partner: No  ? Emotionally Abused: No  ? Physically Abused: No  ? Sexually Abused: No  ? ? ?Outpatient Medications Prior to Visit  ?Medication Sig Dispense Refill  ? acetaminophen (TYLENOL) 500 MG tablet Take 1,000 mg by mouth every 6 (six) hours as needed (pain).     ? amLODipine (NORVASC) 5 MG tablet TAKE 1 TABLET (5 MG TOTAL) BY MOUTH DAILY. 90 tablet 1  ? cyclobenzaprine (FLEXERIL) 10 MG tablet TAKE 1 TABLET BY MOUTH TWICE A DAY AS NEEDED FOR MUSCLE SPASM 30 tablet 1  ? Ferrous Sulfate Dried 143 (45 Fe) MG TBCR Take 1 tablet by mouth daily. 30 tablet 3  ? gabapentin (NEURONTIN) 300 MG capsule Take 1 capsule (300 mg total) by mouth at bedtime. 30 capsule 5  ? hydrochlorothiazide (HYDRODIURIL) 25 MG tablet TAKE 1 TABLET (25 MG TOTAL) BY MOUTH DAILY. 90 tablet 1  ? ibuprofen (ADVIL,MOTRIN) 800 MG tablet Take 1 tablet (800 mg total) by mouth every 8 (eight) hours as needed. 90 tablet 5  ? ?No facility-administered medications prior to visit.  ? ? ?Allergies  ?Allergen Reactions  ? Lisinopril Swelling  ? ? ?ROS ?Review of Systems  ?Constitutional:  Negative for chills and fever.  ?HENT:  Negative for congestion, sinus pressure, sinus pain and sore throat.   ?Eyes:  Negative for pain and discharge.  ?Respiratory:  Negative for cough and shortness of breath.   ?Cardiovascular:  Negative for chest pain and palpitations.  ?Gastrointestinal:  Negative for abdominal pain, constipation, diarrhea, nausea and vomiting.  ?Endocrine: Negative for polydipsia and polyuria.  ?Genitourinary:  Negative for dysuria and hematuria.  ?Musculoskeletal:  Positive for arthralgias, back pain and neck pain. Negative for neck stiffness.  ?Skin:  Negative for rash.  ?Neurological:  Positive for  weakness. Negative for dizziness.  ?Psychiatric/Behavioral:  Negative for agitation and behavioral problems.   ? ?  ?Objective:  ?  ?Physical Exam ?Vitals reviewed.  ?Constitutional:   ?   General: She is not in acute distress. ?   Appearance: She is not diaphoretic.  ?HENT:  ?   Head: Normocephalic and atraumatic.  ?   Nose: Nose normal.  ?   Mouth/Throat:  ?   Mouth: Mucous membranes are moist.  ?Eyes:  ?   General: No scleral icterus. ?   Extraocular Movements: Extraocular movements intact.  ?Neck:  ?   Vascular: No carotid bruit.  ?  Cardiovascular:  ?   Rate and Rhythm: Normal rate and regular rhythm.  ?   Pulses: Normal pulses.  ?   Heart sounds: Normal heart sounds. No murmur heard. ?Pulmonary:  ?   Breath sounds: Normal breath sounds. No wheezing or rales.  ?Musculoskeletal:     ?   General: Tenderness (Paraspinal around lumbar spine area) present.  ?   Cervical back: Neck supple. No tenderness.  ?   Right lower leg: No edema.  ?   Left lower leg: No edema.  ?Skin: ?   General: Skin is warm.  ?   Findings: No rash.  ?Neurological:  ?   General: No focal deficit present.  ?   Mental Status: She is alert and oriented to person, place, and time.  ?   Cranial Nerves: No cranial nerve deficit.  ?   Sensory: No sensory deficit.  ?   Motor: No weakness.  ?Psychiatric:     ?   Mood and Affect: Mood normal.     ?   Behavior: Behavior normal.  ? ? ?BP 120/84 (BP Location: Right Arm, Patient Position: Sitting, Cuff Size: Normal)   Pulse 65   Resp 18   Ht '5\' 8"'$  (1.727 m)   Wt 196 lb 3.2 oz (89 kg)   SpO2 96%   BMI 29.83 kg/m?  ?Wt Readings from Last 3 Encounters:  ?05/23/21 196 lb 3.2 oz (89 kg)  ?01/22/21 190 lb 0.6 oz (86.2 kg)  ?09/18/20 193 lb (87.5 kg)  ? ? ?Lab Results  ?Component Value Date  ? TSH 2.320 07/15/2020  ? ?Lab Results  ?Component Value Date  ? WBC 5.7 01/22/2021  ? HGB 11.9 01/22/2021  ? HCT 35.8 01/22/2021  ? MCV 83 01/22/2021  ? PLT 321 01/22/2021  ? ?Lab Results  ?Component Value Date  ? NA 139  01/22/2021  ? K 3.8 01/22/2021  ? CO2 32 (H) 01/22/2021  ? GLUCOSE 101 (H) 01/22/2021  ? BUN 13 01/22/2021  ? CREATININE 0.58 01/22/2021  ? BILITOT 0.2 07/15/2020  ? ALKPHOS 115 07/15/2020  ? AST 13 07/15/2020  ?

## 2021-05-26 ENCOUNTER — Ambulatory Visit (HOSPITAL_COMMUNITY): Payer: Medicare HMO | Attending: Neurosurgery | Admitting: Physical Therapy

## 2021-05-26 ENCOUNTER — Encounter (HOSPITAL_COMMUNITY): Payer: Self-pay | Admitting: Physical Therapy

## 2021-05-26 DIAGNOSIS — M545 Low back pain, unspecified: Secondary | ICD-10-CM | POA: Insufficient documentation

## 2021-05-26 DIAGNOSIS — R2689 Other abnormalities of gait and mobility: Secondary | ICD-10-CM | POA: Diagnosis not present

## 2021-05-26 NOTE — Therapy (Signed)
?OUTPATIENT PHYSICAL THERAPY NOTE ? ? ?Patient Name: Betty Dawson ?MRN: [id number] ?DOB:Jun 23, 1955, 66 y.o., female ?Today's Date: 05/26/2021 ? ?PCP: Lindell Spar, MD ?REFERRING PROVIDER: Vallarie Mare, MD ?PHYSICAL THERAPY DISCHARGE SUMMARY ? ?Visits from Start of Care: 12 ? ?Current functional level related to goals / functional outcomes: ?See below  ?  ?Remaining deficits: ?See below  ?  ?Education / Equipment: ?See below   ? ?Patient agrees to discharge. Patient goals were met. Patient is being discharged due to being pleased with the current functional level. ? ? ?Reporting Period 04/16/21 to 05/19/21 ? ? See note below for Objective Data and Assessment of Progress/Goals  ? ?END OF SESSION:  ? PT End of Session - 05/26/21 0856   ? ? Visit Number 12   ? Number of Visits 12   ? Date for PT Re-Evaluation 05/28/21   ? Authorization Type [employer] ; cohere approved 12 visits 3/22-5/13   ? Authorization - Visit Number 12   ? Authorization - Number of Visits 12   ? Progress Note Due on Visit 10   ? PT Start Time 0900   ? PT Stop Time [phone]   ? PT Time Calculation (min) 38 min   ? Activity Tolerance Patient tolerated treatment well   ? Behavior During Therapy [facility] for tasks assessed/performed   ? ?  ?  ? ?  ? ? ? ?Past Medical History:  ?Diagnosis Date  ? Anemia   ? Arthritis   ? OA multiple joints  ? Heart murmur   ? Heel spur   ? Hypertension   ? Varicose veins   ? ?Past Surgical History:  ?Procedure Laterality Date  ? ABDOMINAL HYSTERECTOMY    ? endometriosis  ? BREAST BIOPSY Right 2017  ? x2  ? COLONOSCOPY  02/21/2013  ? JXB:JYNWGN polyp measuring 6 mm in size was found in the distal/transverse colon; polypectomy was performed using snare cautery/The colon was redundant/The colon mucosa was otherwise normal/Normal mucosa in the terminal ileum  ? COLONOSCOPY N/A 02/21/2013  ? Procedure: COLONOSCOPY;  Surgeon: Danie Binder, MD;  Location: [facility];  Service: Endoscopy;  Laterality: N/A;  10:30 AM  ?  FOOT SURGERY Right   ? heel spur  ? SHOULDER ARTHROSCOPY WITH ROTATOR CUFF REPAIR Right 01/30/2016  ? Procedure: SHOULDER ARTHROSCOPY WITH ACROMIOPLASTY;  Surgeon: Carole Civil, MD;  Location: [facility];  Service: Orthopedics;  Laterality: Right;  pt knows to arrive at 7:15  ? SHOULDER OPEN ROTATOR CUFF REPAIR Right 01/30/2016  ? Procedure: ROTATOR CUFF REPAIR SHOULDER OPEN;  Surgeon: Carole Civil, MD;  Location: [facility];  Service: Orthopedics;  Laterality: Right;  ? ?Patient Active Problem List  ? Diagnosis Date Noted  ? Encounter for general adult medical examination with abnormal findings 09/18/2020  ? Neck pain 09/18/2020  ? DDD (degenerative disc disease), lumbar 05/05/2019  ? Anemia, iron deficiency 10/30/2016  ? Complete tear of right rotator cuff   ? Subacromial impingement of right shoulder   ? Ankle pain, chronic 06/02/2011  ? HTN (hypertension) 05/20/2011  ? OA (osteoarthritis) 05/20/2011  ? Varicose veins of left leg with edema 05/20/2011  ? ? ?REFERRING DIAG: Spondylosothesis ? ?THERAPY DIAG:  ?Low back pain, unspecified back pain laterality, unspecified chronicity, unspecified whether sciatica present ? ?PERTINENT HISTORY: stenosis, anterolisthesis ? ?PRECAUTIONS: none ? ?SUBJECTIVE: Patient states she is doing well today. She feels ready for DC form therapy. She feels ready to continue with exercise at  the [employer] and discussed beginning walking program around her neighborhood as the weather becomes warmer. Patient reports back is about 70% improved since starting therapy.  ? ? PAIN:  ?Are you having pain? No ? ? ?OBJECTIVE:  Italic from evaluation 5/69/79 and Progress Note information in bold  ?  ?DIAGNOSTIC FINDINGS:  ?IMPRESSION: ?1. Severe L3-4 facet arthrosis with grade 1 anterolisthesis and mild ?spinal, lateral recess, and neural foraminal stenosis. ?2. Mild lateral recess and neural foraminal stenosis at L4-5. ?3. Mild left neural foraminal stenosis at L5-S1. ? ?PATIENT SURVEYS:  ?At eval =  FOTO 40% function ?Today 05/19/21 = 52% function  ?  ?POSTURE:  ?Increased lordosis (lumbar)  Continues ?  ?PALPATION: ?No notable tenderness in hips or lumbar paraspinals  ?  ?LUMBAR ROM:  ?  ?Active  A/PROM  ?04/16/2021 AROM  ?05/19/2021  ?Flexion WNL WNL  ?Extension 80% limited 50% limited*  ?Right lateral flexion 50% limited 50% limited  ?Left lateral flexion 50% limited 25% limited  ?Right rotation     ?Left rotation     ? (Blank rows = not tested) ?  ?  ?  ?LE MMT: ?  ?MMT Right ?04/16/2021 Left ?04/16/2021 Right  ?05/19/21 Left ?05/19/21 Right  ?05/26/21 Left ?05/26/21  ?Hip flexion 4+ 4+ '5 5 5 5  ' ?Hip extension 3- 3- 3 3 3- 3-  ?Hip abduction 3 3- 4 4- 4 4+  ?Hip adduction          ?Hip internal rotation          ?Hip external rotation          ?Knee flexion          ?Knee extension 4 4 4+ 4+ 5 5  ?Ankle dorsiflexion '4 4 4 4 5 ' 4+  ?Ankle plantarflexion          ?Ankle inversion          ?Ankle eversion          ? (Blank rows = not tested) ?  ?  ?   ?  ?TODAY'S TREATMENT  ?05/26/21 ?Mini reassess/ golas review ?HEP review: ?Bridge 2 x10 ?Standing hip abduction/ extension 2 x15 each ?Band rows/ extensions BTB x15 each ?Palloff press GTB x15 each  ?Sit to stand (to chair) with Red med ball hold x20 ? ?05/21/21 ?Nustep lv 3 5 min for strengthening  ?6 inch step taps x20 ?GTB hip abduction 2 x 10 ?GTB hip extension 2 x 10 ?Chair squats with red med ball hold x10  ?GTB rows/ extension 2 x 15 each  ?Tandem stance on foam 3 x 20" ?Palloff press GTB  x 15 each  ?Trunk twist with alt LE on 6 inch box holding red med ball 2 x 10 each  ? ? ?05/19/21:  Pre-assessment for progress note, measurements ? Standing =  ?  Heel raises 10 x 3 second hold  ?  Marching with intermittent HHA 20 repts x 1 set then 10 x 3 second hold x 1 set ?  Hip extension 2 x 10 reps B  ?  Hip abduction 2 x 10 reps B  ?  Narrow BOS balance x 30 seconds ?  Tandem L balance x 30 seconds ?  Tandem R balance x 30 seconds ?  Single leg balance x 10 second B with HHA  on // bars ?  Band rows x 10 reps x 2 verse green band ?  Band shoulder extensions x 10 reps x 2 sets  verse green band ?  Paloff press x 10 reps B x 1 set  ?  Step up at stairs x 10 reps B leading ?   ?   ?  ? ?  ?PATIENT EDUCATION:  ?Education details: 05/26/21: on progress to goals, HEP exercise and DC status 05/21/21:on exercise form and function 05/19/21 - progress since evaluation, next steps, HEP review  ?Person educated: Patient ?Education method: Explanation and Demonstration ?Education comprehension: verbalized understanding and returned demonstration ?  ?  ?HOME EXERCISE PROGRAM: ?05/21/21 ?Access Code: [email or web address] ?Exercises ?- Standing Shoulder Row with Anchored Resistance  - 1-2 x daily - 7 x weekly - 2 sets - 15 reps ?- Shoulder extension with resistance - Neutral  - 1-2 x daily - 7 x weekly - 2 sets - 15 reps ?- Standing Anti-Rotation Press with Anchored Resistance  - 1-2 x daily - 7 x weekly - 2 sets - 15 reps ?- Hip Abduction with Resistance Loop  - 1-2 x daily - 7 x weekly - 2 sets - 15 reps ?- Hip Extension with Resistance Loop  - 1-2 x daily - 7 x weekly - 2 sets - 15 reps4/17/23Access Code: [email or web address] ?Date: 05/12/2021 ?Prepared by: Josue Hector ? ?Exercises ?- Hip Abduction with Resistance Loop  - 2 x daily - 7 x weekly - 2 sets - 10 repsAccess Code: [email or web address] ?URL: [email or web address] ?Date: 05/07/2021 ?Prepared by: Josue Hector ? ?Exercises ?- Standing Hip Abduction with Counter Support  - 2 x daily - 7 x weekly - 2 sets - 10 reps ?- Heel Raises with Counter Support  - 2 x daily - 7 x weekly - 2 sets - 10 repsAccess Code: [email or web address] ?URL: [email or web address] ?Date: 04/16/2021 ?Prepared by: Josue Hector ?  ?Exercises ?Supine Bridge - 2-3 x daily - 7 x weekly - 1-2 sets - 10 reps - 5 second hold ?Supine Transversus Abdominis Bracing - Hands on Stomach - 2-3 x daily - 7 x weekly - 1-2 sets - 10 reps - 5 second hold ?  ?3/29:  Access Code:  [email or web address] ?[email or web address] ?Prepared by: Roseanne Reno  long sitting hamstring stretch, SLR, sidelying hip abduction and sidelying clams ?  ?ASSESSMENT: ?  ?CLINICAL IMPRESSION: ?Patient has made good progre

## 2021-05-28 ENCOUNTER — Encounter (HOSPITAL_COMMUNITY): Payer: Medicare HMO

## 2021-07-01 ENCOUNTER — Ambulatory Visit: Payer: Medicare HMO | Admitting: *Deleted

## 2021-07-01 ENCOUNTER — Telehealth: Payer: Self-pay

## 2021-07-01 DIAGNOSIS — M5136 Other intervertebral disc degeneration, lumbar region: Secondary | ICD-10-CM

## 2021-07-01 NOTE — Patient Instructions (Addendum)
Visit Information  Thank you for taking time to visit with me today. Please don't hesitate to contact me if I can be of assistance to you before our next scheduled telephone appointment.  Following are the goals we discussed today:  Take medications as prescribed   Attend all scheduled provider appointments Call pharmacy for medication refills 3-7 days in advance of running out of medications Attend church or other social activities Perform all self care activities independently  Perform IADL's (shopping, preparing meals, housekeeping, managing finances) independently Call provider office for new concerns or questions  check blood pressure weekly choose a place to take my blood pressure (home, clinic or office, retail store) write blood pressure results in a log or diary keep a blood pressure log take blood pressure log to all doctor appointments keep all doctor appointments take medications for blood pressure exactly as prescribed report new symptoms to your doctor eat more whole grains, fruits and vegetables, lean meats and healthy fats Look over education sent via My Chart- low sodium diet Expect a call from [place] about food resources Continue to walk for exercise- keep up the good work!  Our next appointment is by telephone on 09/11/21 at 130 pm   Low-Sodium Eating Plan Sodium, which is an element that makes up salt, helps you maintain a healthy balance of fluids in your body. Too much sodium can increase your blood pressure and cause fluid and waste to be held in your body. Your health care provider or dietitian may recommend following this plan if you have high blood pressure (hypertension), kidney disease, liver disease, or heart failure. Eating less sodium can help lower your blood pressure, reduce swelling, and protect your heart, liver, and kidneys. What are tips for following this plan? Reading food labels The Nutrition Facts label lists the amount of sodium in one  serving of the food. If you eat more than one serving, you must multiply the listed amount of sodium by the number of servings. Choose foods with less than 140 mg of sodium per serving. Avoid foods with 300 mg of sodium or more per serving. Shopping  Look for lower-sodium products, often labeled as "low-sodium" or "no salt added." Always check the sodium content, even if foods are labeled as "unsalted" or "no salt added." Buy fresh foods. Avoid canned foods and pre-made or frozen meals. Avoid canned, cured, or processed meats. Buy breads that have less than 80 mg of sodium per slice. Cooking  Eat more home-cooked food and less restaurant, buffet, and fast food. Avoid adding salt when cooking. Use salt-free seasonings or herbs instead of table salt or sea salt. Check with your health care provider or pharmacist before using salt substitutes. Cook with plant-based oils, such as canola, sunflower, or olive oil. Meal planning When eating at a restaurant, ask that your food be prepared with less salt or no salt, if possible. Avoid dishes labeled as brined, pickled, cured, smoked, or made with soy sauce, miso, or teriyaki sauce. Avoid foods that contain MSG (monosodium glutamate). MSG is sometimes added to [place] food, bouillon, and some canned foods. Make meals that can be grilled, baked, poached, roasted, or steamed. These are generally made with less sodium. General information Most people on this plan should limit their sodium intake to 1,500-2,000 mg (milligrams) of sodium each day. What foods should I eat? Fruits Fresh, frozen, or canned fruit. Fruit juice. Vegetables Fresh or frozen vegetables. "No salt added" canned vegetables. "No salt added" tomato sauce and  paste. Low-sodium or reduced-sodium tomato and vegetable juice. Grains Low-sodium cereals, including oats, puffed wheat and rice, and shredded wheat. Low-sodium crackers. Unsalted rice. Unsalted pasta. Low-sodium bread.  Whole-grain breads and whole-grain pasta. Meats and other proteins Fresh or frozen (no salt added) meat, poultry, seafood, and fish. Low-sodium canned tuna and salmon. Unsalted nuts. Dried peas, beans, and lentils without added salt. Unsalted canned beans. Eggs. Unsalted nut butters. Dairy Milk. Soy milk. Cheese that is naturally low in sodium, such as ricotta cheese, fresh mozzarella, or Swiss cheese. Low-sodium or reduced-sodium cheese. Cream cheese. Yogurt. Seasonings and condiments Fresh and dried herbs and spices. Salt-free seasonings. Low-sodium mustard and ketchup. Sodium-free salad dressing. Sodium-free light mayonnaise. Fresh or refrigerated horseradish. Lemon juice. Vinegar. Other foods Homemade, reduced-sodium, or low-sodium soups. Unsalted popcorn and pretzels. Low-salt or salt-free chips. The items listed above may not be a complete list of foods and beverages you can eat. Contact a dietitian for more information. What foods should I avoid? Vegetables Sauerkraut, pickled vegetables, and relishes. Olives. [place] fries. Onion rings. Regular canned vegetables (not low-sodium or reduced-sodium). Regular canned tomato sauce and paste (not low-sodium or reduced-sodium). Regular tomato and vegetable juice (not low-sodium or reduced-sodium). Frozen vegetables in sauces. Grains Instant hot cereals. Bread stuffing, pancake, and biscuit mixes. Croutons. Seasoned rice or pasta mixes. Noodle soup cups. Boxed or frozen macaroni and cheese. Regular salted crackers. Self-rising flour. Meats and other proteins Meat or fish that is salted, canned, smoked, spiced, or pickled. Precooked or cured meat, such as sausages or meat loaves. [place] Salines. Ham. Pepperoni. Hot dogs. Corned beef. Chipped beef. Salt pork. Jerky. Pickled herring. Anchovies and sardines. Regular canned tuna. Salted nuts. Dairy Processed cheese and cheese spreads. Hard cheeses. Cheese curds. Blue cheese. Feta cheese. String cheese. Regular  cottage cheese. Buttermilk. Canned milk. Fats and oils Salted butter. Regular margarine. Ghee. Bacon fat. Seasonings and condiments Onion salt, garlic salt, seasoned salt, table salt, and sea salt. Canned and packaged gravies. Worcestershire sauce. Tartar sauce. Barbecue sauce. Teriyaki sauce. Soy sauce, including reduced-sodium. Steak sauce. Fish sauce. Oyster sauce. Cocktail sauce. Horseradish that you find on the shelf. Regular ketchup and mustard. Meat flavorings and tenderizers. Bouillon cubes. Hot sauce. Pre-made or packaged marinades. Pre-made or packaged taco seasonings. Relishes. Regular salad dressings. Salsa. Other foods Salted popcorn and pretzels. Corn chips and puffs. Potato and tortilla chips. Canned or dried soups. Pizza. Frozen entrees and pot pies. The items listed above may not be a complete list of foods and beverages you should avoid. Contact a dietitian for more information. Summary Eating less sodium can help lower your blood pressure, reduce swelling, and protect your heart, liver, and kidneys. Most people on this plan should limit their sodium intake to 1,500-2,000 mg (milligrams) of sodium each day. Canned, boxed, and frozen foods are high in sodium. Restaurant foods, fast foods, and pizza are also very high in sodium. You also get sodium by adding salt to food. Try to cook at home, eat more fresh fruits and vegetables, and eat less fast food and canned, processed, or prepared foods. This information is not intended to replace advice given to you by your health care provider. Make sure you discuss any questions you have with your health care provider. Document Revised: 02/17/2019 Document Reviewed: 12/14/2018 Elsevier Patient Education  [place].   Please call the care guide team at [phone] if you need to cancel or reschedule your appointment.   If you are experiencing a Mental Health or [place]  Crisis or need someone to talk to, please call the  Suicide and Crisis Lifeline: 988 call the [place] National Suicide Prevention Lifeline: 413 255 9040 or TTY: 585-306-0109 TTY 804-533-4456) to talk to a trained counselor call 1-800-273-TALK (toll free, 24 hour hotline) go to [facility] [address], [place] [phone]) call the [facility]: [phone] call 911   Following is a copy of your full plan of care:  There are no care plans that you recently modified to display for this patient.   Ms. Giraldo was given information about Care Management services by the embedded care coordination team including:  Care Management services include personalized support from designated clinical staff supervised by her physician, including individualized plan of care and coordination with other care providers 24/7 contact phone numbers for assistance for urgent and routine care needs. The patient may stop CCM services at any time (effective at the end of the month) by phone call to the office staff.  Patient agreed to services and verbal consent obtained.   Patient verbalizes understanding of instructions and care plan provided today and agrees to view in [place]. Active MyChart status and patient understanding of how to access instructions and care plan via MyChart confirmed with patient.     Telephone follow up appointment with care management team member scheduled for:  09/11/21  Jacqlyn Larsen [facility], BSN RN Case Manager [facility] [phone]

## 2021-07-01 NOTE — Telephone Encounter (Signed)
   Telephone encounter was:  Successful.  07/01/2021 Name: Betty Dawson MRN: [id number] DOB: 1955/06/29  Betty Dawson is a 66 y.o. year old female who is a primary care patient of Lindell Spar, MD . The community resource team was consulted for assistance with [place] guide performed the following interventions: Patient provided with information about care guide support team and interviewed to confirm resource needs.  Follow Up Plan:  No further follow up planned at this time. The patient has been provided with needed resources.    [place], Care Management  [phone] [address], [place], [place] [address] Phone: [phone] Email: [email or web address]

## 2021-07-01 NOTE — Chronic Care Management (AMB) (Signed)
Care Management    RN Visit Note  07/01/2021 Name: Betty Dawson MRN: [id number] DOB: 1955/12/31  Subjective: Betty Dawson is a 66 y.o. year old female who is a primary care patient of Betty Spar, MD. The care management team was consulted for assistance with disease management and care coordination needs.    Engaged with patient by telephone for initial visit in response to provider referral for case management and/or care coordination services.   Consent to Services:   Betty Dawson was given information about Care Management services today including:  Care Management services includes personalized support from designated clinical staff supervised by her physician, including individualized plan of care and coordination with other care providers 24/7 contact phone numbers for assistance for urgent and routine care needs. The patient may stop case management services at any time by phone call to the office staff.  Patient agreed to services and consent obtained.   Assessment: Review of patient past medical history, allergies, medications, health status, including review of consultants reports, laboratory and other test data, was performed as part of comprehensive evaluation and provision of chronic care management services.   SDOH (Social Determinants of Health) assessments and interventions performed:  SDOH Interventions    Flowsheet Row Most Recent Value  SDOH Interventions   Food Insecurity Interventions Other (Comment)  [referral to care guide]  Housing Interventions Intervention Not Indicated  Transportation Interventions Intervention Not Indicated        Care Plan  Allergies  Allergen Reactions   Lisinopril Swelling    Outpatient Encounter Medications as of 07/01/2021  Medication Sig   acetaminophen (TYLENOL) 500 MG tablet Take 1,000 mg by mouth every 6 (six) hours as needed (pain).    amLODipine (NORVASC) 5 MG tablet TAKE 1 TABLET (5 MG TOTAL) BY MOUTH DAILY.    cyclobenzaprine (FLEXERIL) 10 MG tablet TAKE 1 TABLET BY MOUTH TWICE A DAY AS NEEDED FOR MUSCLE SPASM   Ferrous Sulfate Dried 143 (45 Fe) MG TBCR Take 1 tablet by mouth daily.   gabapentin (NEURONTIN) 300 MG capsule Take 1 capsule (300 mg total) by mouth at bedtime.   hydrochlorothiazide (HYDRODIURIL) 25 MG tablet TAKE 1 TABLET (25 MG TOTAL) BY MOUTH DAILY.   ibuprofen (ADVIL,MOTRIN) 800 MG tablet Take 1 tablet (800 mg total) by mouth every 8 (eight) hours as needed.   No facility-administered encounter medications on file as of 07/01/2021.    Patient Active Problem List   Diagnosis Date Noted   Encounter for general adult medical examination with abnormal findings 09/18/2020   Neck pain 09/18/2020   DDD (degenerative disc disease), lumbar 05/05/2019   Anemia, iron deficiency 10/30/2016   Complete tear of right rotator cuff    Subacromial impingement of right shoulder    Ankle pain, chronic 06/02/2011   HTN (hypertension) 05/20/2011   OA (osteoarthritis) 05/20/2011   Varicose veins of left leg with edema 05/20/2011    Conditions to be addressed/monitored: HTN  Current Barriers:  Knowledge Deficits related to plan of care for management of HTN  Chronic Disease Management support and education needs related to HTN Financial Constraints.  No Advanced Directives in place- pt reports she has documents at home but has not completed yet Patient receives 23$ month food stamps, requests care guide outreach for resources such as food pantries, patient reports she has enough food at present but at times she feels like she is low on food supply. Patient reports she lives alone, has adult daughter she  can call on if needed, continues to drive, is independent with all aspects of her care, has blood pressure cuff but does not check blood pressure, pt reports she does not have pain today.  Patient reports she drinks mostly water and tries to be mindful of sodium intake, walks for exercise.  RNCM  Clinical Goal(s):  Patient will verbalize understanding of plan for management of HTN as evidenced by patient report, review of EHR and through collaboration with RN Care manager, provider, and care team.   Interventions: 1:1 collaboration with primary care provider regarding development and update of comprehensive plan of care as evidenced by provider attestation and co-signature Inter-disciplinary care team collaboration (see longitudinal plan of care) Evaluation of current treatment plan related to  self management and patient's adherence to plan as established by provider  Hypertension Interventions:  (Status:  New goal. and Goal on track:  Yes.) Long Term Goal Last practice recorded BP readings:  BP Readings from Last 3 Encounters:  05/23/21 120/84  01/22/21 118/68  09/18/20 124/70   Most recent eGFR/CrCl:  Lab Results  Component Value Date   EGFR 100 01/22/2021    No components found for: CRCL  Evaluation of current treatment plan related to hypertension self management and patient's adherence to plan as established by provider Reviewed medications with patient and discussed importance of compliance Discussed plans with patient for ongoing care management follow up and provided patient with direct contact information for care management team Discussed complications of poorly controlled blood pressure such as heart disease, stroke, circulatory complications, vision complications, kidney impairment, sexual dysfunction Screening for signs and symptoms of depression related to chronic disease state   Reviewed importance of following low sodium diet and reading food labels Education provided via My Chart- low sodium diet Referral sent for Care Guide- food resources (pantries, etc.)  Patient Goals/Self-Care Activities: Take medications as prescribed   Attend all scheduled provider appointments Call pharmacy for medication refills 3-7 days in advance of running out of  medications Attend church or other social activities Perform all self care activities independently  Perform IADL's (shopping, preparing meals, housekeeping, managing finances) independently Call provider office for new concerns or questions  check blood pressure weekly choose a place to take my blood pressure (home, clinic or office, retail store) write blood pressure results in a log or diary keep a blood pressure log take blood pressure log to all doctor appointments keep all doctor appointments take medications for blood pressure exactly as prescribed report new symptoms to your doctor eat more whole grains, fruits and vegetables, lean meats and healthy fats Look over education sent via My Chart- low sodium diet Expect a call from [place] about food resources Continue to walk for exercise- keep up the good work!  Plan: Telephone follow up appointment with care management team member scheduled for:  09/11/21  Jacqlyn Larsen [facility], BSN RN Case Manager [facility] [phone]

## 2021-07-02 ENCOUNTER — Telehealth: Payer: Self-pay

## 2021-07-02 NOTE — Telephone Encounter (Signed)
   Telephone encounter was:  Successful.  07/02/2021 Name: [name] MRN: [id number] DOB: July 22, 1955  Betty Dawson is a 66 y.o. year old female who is a primary care patient of Lindell Spar, MD . The community resource team was consulted for assistance with [place] guide performed the following interventions: Patient provided with information about care guide support team and interviewed to confirm resource needs.Patient was able to get into the email and had no questions or concerns she has my contact information if needed  Follow Up Plan:  No further follow up planned at this time. The patient has been provided with needed resources.    [place], Care Management  [phone] [address], [place], [place] [address] Phone: [phone] Email: [email or web address]

## 2021-09-04 ENCOUNTER — Encounter: Payer: Self-pay | Admitting: *Deleted

## 2021-09-04 ENCOUNTER — Ambulatory Visit: Payer: Self-pay | Admitting: *Deleted

## 2021-09-04 DIAGNOSIS — I10 Essential (primary) hypertension: Secondary | ICD-10-CM

## 2021-09-04 NOTE — Patient Instructions (Signed)
Visit Information  Thank you for taking time to visit with me today. Please don't hesitate to contact me if I can be of assistance to you before our next scheduled telephone appointment.  Following are the goals we discussed today:  Take medications as prescribed   Attend all scheduled provider appointments Call pharmacy for medication refills 3-7 days in advance of running out of medications Attend church or other social activities Perform all self care activities independently  Perform IADL's (shopping, preparing meals, housekeeping, managing finances) independently Call provider office for new concerns or questions  check blood pressure weekly choose a place to take my blood pressure (home, clinic or office, retail store) write blood pressure results in a log or diary keep a blood pressure log take blood pressure log to all doctor appointments call doctor for signs and symptoms of high blood pressure keep all doctor appointments take medications for blood pressure exactly as prescribed report new symptoms to your doctor eat more whole grains, fruits and vegetables, lean meats and healthy fats Follow low sodium diet Continue to walk for exercise- keep up the good work! Case closure today- please let your doctor know if you have any future care management needs   Please call the care guide team at [phone] if you need to cancel or reschedule your appointment.   If you are experiencing a Mental Health or [place] or need someone to talk to, please call the Suicide and Crisis Lifeline: 988 call the [place] National Suicide Prevention Lifeline: 513 053 2175 or TTY: (574)119-3339 TTY 617 505 9747) to talk to a trained counselor call 1-800-273-TALK (toll free, 24 hour hotline) go to [facility] [address] [place] [phone]) call the [place]: [phone] call 911   Patient verbalizes understanding  of instructions and care plan provided today and agrees to view in [place]. Active MyChart status and patient understanding of how to access instructions and care plan via MyChart confirmed with patient.     No further follow up required: case closure today  Jacqlyn Larsen [facility], BSN RN Case Manager [facility] [phone]

## 2021-09-04 NOTE — Chronic Care Management (AMB) (Signed)
Care Management    RN Visit Note  09/04/2021 Name: Betty Dawson MRN: [id number] DOB: 09-08-1955  Subjective: Betty Dawson is a 67 y.o. year old female who is a primary care patient of Betty Spar, MD. The care management team was consulted for assistance with disease management and care coordination needs.    Engaged with patient by telephone for follow up visit in response to provider referral for case management and/or care coordination services.   Consent to Services:   Betty Dawson was given information about Care Management services today including:  Care Management services includes personalized support from designated clinical staff supervised by her physician, including individualized plan of care and coordination with other care providers 24/7 contact phone numbers for assistance for urgent and routine care needs. The patient may stop case management services at any time by phone call to the office staff.  Patient agreed to services and consent obtained.   Assessment: Review of patient past medical history, allergies, medications, health status, including review of consultants reports, laboratory and other test data, was performed as part of comprehensive evaluation and provision of chronic care management services.   SDOH (Social Determinants of Health) assessments and interventions performed:    Care Plan  Allergies  Allergen Reactions   Lisinopril Swelling    Outpatient Encounter Medications as of 09/04/2021  Medication Sig   acetaminophen (TYLENOL) 500 MG tablet Take 1,000 mg by mouth every 6 (six) hours as needed (pain).    amLODipine (NORVASC) 5 MG tablet TAKE 1 TABLET (5 MG TOTAL) BY MOUTH DAILY.   cyclobenzaprine (FLEXERIL) 10 MG tablet TAKE 1 TABLET BY MOUTH TWICE A DAY AS NEEDED FOR MUSCLE SPASM   Ferrous Sulfate Dried 143 (45 Fe) MG TBCR Take 1 tablet by mouth daily.   gabapentin (NEURONTIN) 300 MG capsule Take 1 capsule (300 mg total) by mouth at  bedtime.   hydrochlorothiazide (HYDRODIURIL) 25 MG tablet TAKE 1 TABLET (25 MG TOTAL) BY MOUTH DAILY.   ibuprofen (ADVIL,MOTRIN) 800 MG tablet Take 1 tablet (800 mg total) by mouth every 8 (eight) hours as needed.   No facility-administered encounter medications on file as of 09/04/2021.    Patient Active Problem List   Diagnosis Date Noted   Encounter for general adult medical examination with abnormal findings 09/18/2020   Neck pain 09/18/2020   DDD (degenerative disc disease), lumbar 05/05/2019   Anemia, iron deficiency 10/30/2016   Complete tear of right rotator cuff    Subacromial impingement of right shoulder    Ankle pain, chronic 06/02/2011   HTN (hypertension) 05/20/2011   OA (osteoarthritis) 05/20/2011   Varicose veins of left leg with edema 05/20/2011    Conditions to be addressed/monitored: HTN  Patient Care Plan: RN Care Manager Plan of Care     Problem Identified: No plan of care established for management of chronic disease state  (HTN) Resolved 09/04/2021  Priority: High     Long-Range Goal: Development of plan of care for chronic disease management  (HTN) Completed 09/04/2021  Start Date: 07/01/2021  Expected End Date: 12/28/2021  Priority: High  Note:   Current Barriers:  Knowledge Deficits related to plan of care for management of HTN  Chronic Disease Management support and education needs related to HTN Financial Constraints.  No Advanced Directives in place- pt reports she has documents at home but has not completed yet Patient receives 23$ month food stamps, requests care guide outreach for resources such as food pantries, patient reports  she has enough food at present but at times she feels like she is low on food supply. Patient reports she lives alone, has adult daughter she can call on if needed, continues to drive, is independent with all aspects of her care, has blood pressure cuff but does not check blood pressure, pt reports she does not have pain  today.  Patient reports she drinks mostly water and tries to be mindful of sodium intake, walks for exercise. 09/04/21- pt reports care guide did outreach her for food resources, pt states she is doing well, taking medications as prescribed, no new concerns reported.  RNCM Clinical Goal(s):  Patient will verbalize understanding of plan for management of HTN as evidenced by patient report, review of EHR and  through collaboration with RN Care manager, provider, and care team.   Interventions: 1:1 collaboration with primary care provider regarding development and update of comprehensive plan of care as evidenced by provider attestation and co-signature Inter-disciplinary care team collaboration (see longitudinal plan of care) Evaluation of current treatment plan related to  self management and patient's adherence to plan as established by provider  Hypertension Interventions:  (Status:  New goal. and Goal on track:  Yes.) Long Term Goal Last practice recorded BP readings:  BP Readings from Last 3 Encounters:  05/23/21 120/84  01/22/21 118/68  09/18/20 124/70  Most recent eGFR/CrCl:  Lab Results  Component Value Date   EGFR 100 01/22/2021    No components found for: CRCL  Evaluation of current treatment plan related to hypertension self management and patient's adherence to plan as established by provider Reviewed medications with patient and discussed importance of compliance  Reinforced importance of following low sodium diet and reading food labels, limiting fast food Plan of care reviewed with pt including case closure today  Patient Goals/Self-Care Activities: Take medications as prescribed   Attend all scheduled provider appointments Call pharmacy for medication refills 3-7 days in advance of running out of medications Attend church or other social activities Perform all self care activities independently  Perform IADL's (shopping, preparing meals, housekeeping, managing finances)  independently Call provider office for new concerns or questions  check blood pressure weekly choose a place to take my blood pressure (home, clinic or office, retail store) write blood pressure results in a log or diary keep a blood pressure log take blood pressure log to all doctor appointments call doctor for signs and symptoms of high blood pressure keep all doctor appointments take medications for blood pressure exactly as prescribed report new symptoms to your doctor eat more whole grains, fruits and vegetables, lean meats and healthy fats Follow low sodium diet Continue to walk for exercise- keep up the good work! Case closure today- please let your doctor know if you have any future care management needs         Plan: No further follow up required: case closure today  Jacqlyn Larsen [facility], BSN RN Case Manager [facility] [phone]

## 2021-09-08 ENCOUNTER — Other Ambulatory Visit: Payer: Self-pay | Admitting: Internal Medicine

## 2021-09-11 ENCOUNTER — Other Ambulatory Visit: Payer: Self-pay | Admitting: Internal Medicine

## 2021-09-11 ENCOUNTER — Telehealth: Payer: Medicare HMO

## 2021-09-11 DIAGNOSIS — I10 Essential (primary) hypertension: Secondary | ICD-10-CM

## 2021-10-01 DIAGNOSIS — M1990 Unspecified osteoarthritis, unspecified site: Secondary | ICD-10-CM | POA: Diagnosis not present

## 2021-10-01 DIAGNOSIS — Z0000 Encounter for general adult medical examination without abnormal findings: Secondary | ICD-10-CM | POA: Diagnosis not present

## 2021-10-01 DIAGNOSIS — E559 Vitamin D deficiency, unspecified: Secondary | ICD-10-CM | POA: Diagnosis not present

## 2021-10-01 DIAGNOSIS — I10 Essential (primary) hypertension: Secondary | ICD-10-CM | POA: Diagnosis not present

## 2021-10-02 ENCOUNTER — Other Ambulatory Visit: Payer: Self-pay | Admitting: *Deleted

## 2021-10-02 DIAGNOSIS — Z1231 Encounter for screening mammogram for malignant neoplasm of breast: Secondary | ICD-10-CM

## 2021-10-03 ENCOUNTER — Ambulatory Visit: Payer: Medicare HMO | Admitting: Internal Medicine

## 2021-10-03 LAB — CMP14+EGFR
ALT: 9 IU/L (ref 0–32)
AST: 16 IU/L (ref 0–40)
Albumin/Globulin Ratio: 1.4 (ref 1.2–2.2)
Albumin: 4 g/dL (ref 3.9–4.9)
Alkaline Phosphatase: 103 IU/L (ref 44–121)
BUN/Creatinine Ratio: 21 (ref 12–28)
BUN: 12 mg/dL (ref 8–27)
Bilirubin Total: 0.3 mg/dL (ref 0.0–1.2)
CO2: 21 mmol/L (ref 20–29)
Calcium: 9.2 mg/dL (ref 8.7–10.3)
Chloride: 102 mmol/L (ref 96–106)
Creatinine, Ser: 0.57 mg/dL (ref 0.57–1.00)
Globulin, Total: 2.9 g/dL (ref 1.5–4.5)
Glucose: 101 mg/dL — ABNORMAL HIGH (ref 70–99)
Potassium: 4.2 mmol/L (ref 3.5–5.2)
Sodium: 140 mmol/L (ref 134–144)
Total Protein: 6.9 g/dL (ref 6.0–8.5)
eGFR: 100 mL/min/{1.73_m2} (ref >59)

## 2021-10-03 LAB — CBC WITH DIFFERENTIAL/PLATELET
Basophils Absolute: 0 10*3/uL (ref 0.0–0.2)
Basos: 1 %
EOS (ABSOLUTE): 0.1 10*3/uL (ref 0.0–0.4)
Eos: 2 %
Hematocrit: 33.7 % — ABNORMAL LOW (ref 34.0–46.6)
Hemoglobin: 11.1 g/dL (ref 11.1–15.9)
Immature Grans (Abs): 0 10*3/uL (ref 0.0–0.1)
Immature Granulocytes: 0 %
Lymphocytes Absolute: 1.8 10*3/uL (ref 0.7–3.1)
Lymphs: 33 %
MCH: 27.4 pg (ref 26.6–33.0)
MCHC: 32.9 g/dL (ref 31.5–35.7)
MCV: 83 fL (ref 79–97)
Monocytes Absolute: 0.4 10*3/uL (ref 0.1–0.9)
Monocytes: 7 %
Neutrophils Absolute: 3 10*3/uL (ref 1.4–7.0)
Neutrophils: 57 %
Platelets: 288 10*3/uL (ref 150–450)
RBC: 4.05 x10E6/uL (ref 3.77–5.28)
RDW: 12 % (ref 11.7–15.4)
WBC: 5.4 10*3/uL (ref 3.4–10.8)

## 2021-10-03 LAB — VITAMIN D 25 HYDROXY (VIT D DEFICIENCY, FRACTURES): Vit D, 25-Hydroxy: 42.1 ng/mL (ref 30.0–100.0)

## 2021-10-03 LAB — LIPID PANEL
Chol/HDL Ratio: 2.2 ratio (ref 0.0–4.4)
Cholesterol, Total: 141 mg/dL (ref 100–199)
HDL: 64 mg/dL (ref >39)
LDL Chol Calc (NIH): 66 mg/dL (ref 0–99)
Triglycerides: 50 mg/dL (ref 0–149)
VLDL Cholesterol Cal: 11 mg/dL (ref 5–40)

## 2021-10-03 LAB — HEMOGLOBIN A1C
Est. average glucose Bld gHb Est-mCnc: 117 mg/dL
Hgb A1c MFr Bld: 5.7 % — ABNORMAL HIGH (ref 4.8–5.6)

## 2021-10-03 LAB — TSH: TSH: 2.42 u[IU]/mL (ref 0.450–4.500)

## 2021-10-06 ENCOUNTER — Ambulatory Visit (INDEPENDENT_AMBULATORY_CARE_PROVIDER_SITE_OTHER): Payer: Medicare PPO

## 2021-10-06 DIAGNOSIS — Z0000 Encounter for general adult medical examination without abnormal findings: Secondary | ICD-10-CM

## 2021-10-06 NOTE — Progress Notes (Signed)
I connected with  Versie Starks on 10/06/21 by a audio enabled telemedicine application and verified that I am speaking with the correct person using two identifiers.  Patient Location: Home  Provider Location: Home Office  I discussed the limitations of evaluation and management by telemedicine. The patient expressed understanding and agreed to proceed.  Subjective:   Betty Dawson is a 66 y.o. female who presents for Medicare Annual (Subsequent) preventive examination.  Review of Systems     Cardiac Risk Factors include: hypertension;advanced age (>28mn, >>9women);obesity (BMI >30kg/m2)     Objective:    There were no vitals filed for this visit. There is no height or weight on file to calculate BMI.     07/01/2021   10:16 AM 04/16/2021   10:41 AM 09/29/2020    9:49 AM 04/06/2019   11:17 AM 03/20/2016    9:13 AM 01/30/2016    7:29 AM 01/28/2016   10:08 AM  Advanced Directives  Does Patient Have a Medical Advance Directive? No No No No No No No  Would patient like information on creating a medical advance directive? No - Patient declined No - Patient declined No - Patient declined No - Patient declined No - Patient declined  No - Patient declined    Current Medications (verified) Outpatient Encounter Medications as of 10/06/2021  Medication Sig   acetaminophen (TYLENOL) 500 MG tablet Take 1,000 mg by mouth every 6 (six) hours as needed (pain).    amLODipine (NORVASC) 5 MG tablet TAKE 1 TABLET (5 MG TOTAL) BY MOUTH DAILY.   cyclobenzaprine (FLEXERIL) 10 MG tablet TAKE 1 TABLET BY MOUTH TWICE A DAY AS NEEDED FOR MUSCLE SPASM   Ferrous Sulfate Dried 143 (45 Fe) MG TBCR Take 1 tablet by mouth daily.   gabapentin (NEURONTIN) 300 MG capsule Take 1 capsule (300 mg total) by mouth at bedtime.   hydrochlorothiazide (HYDRODIURIL) 25 MG tablet TAKE 1 TABLET (25 MG TOTAL) BY MOUTH DAILY.   ibuprofen (ADVIL,MOTRIN) 800 MG tablet Take 1 tablet (800 mg total) by mouth every 8 (eight) hours  as needed.   No facility-administered encounter medications on file as of 10/06/2021.    Allergies (verified) Lisinopril   History: Past Medical History:  Diagnosis Date   Anemia    Arthritis    OA multiple joints   Heart murmur    Heel spur    Hypertension    Varicose veins    Past Surgical History:  Procedure Laterality Date   ABDOMINAL HYSTERECTOMY     endometriosis   BREAST BIOPSY Right 2017   x2   COLONOSCOPY  02/21/2013   SDTO:IZTIWPpolyp measuring 6 mm in size was found in the distal/transverse colon; polypectomy was performed using snare cautery/The colon was redundant/The colon mucosa was otherwise normal/Normal mucosa in the terminal ileum   COLONOSCOPY N/A 02/21/2013   Procedure: COLONOSCOPY;  Surgeon: SDanie Binder MD;  Location: [facility];  Service: Endoscopy;  Laterality: N/A;  10:30 AM   FOOT SURGERY Right    heel spur   SHOULDER ARTHROSCOPY WITH ROTATOR CUFF REPAIR Right 01/30/2016   Procedure: SHOULDER ARTHROSCOPY WITH ACROMIOPLASTY;  Surgeon: SCarole Civil MD;  Location: [facility] ORS;  Service: Orthopedics;  Laterality: Right;  [email or web address] 01[place]   Procedure: RCooperOPEN;  Surgeon: SCarole Civil MD;  Location: [facility];  Service: Orthopedics;  Laterality: Right;   Family History  Problem Relation Age of Onset   Hypertension Mother    Cancer Mother    Diabetes Sister    Hyperlipidemia Sister    Hypertension Sister    Diabetes Brother    Hypertension Brother    Social History   Socioeconomic History   Marital status: Single    Spouse name: Not on file   Number of children: Not on file   Years of education: Not on file   Highest education level: Not on file  Occupational History   Not on file  Tobacco Use   Smoking status: Never   Smokeless tobacco: Never  Substance and Sexual Activity   Alcohol use: No   Drug use: No   Sexual activity: Not Currently     Birth control/protection: Surgical  Other Topics Concern   Not on file  Social History Narrative   Not on file   Social Determinants of Health   Financial Resource Strain: Low Risk  (10/06/2021)   Overall Financial Resource Strain (CARDIA)    Difficulty of Paying Living Expenses: Not very hard  Food Insecurity: Food Insecurity Present (07/01/2021)   Hunger Vital Sign    Worried About [place] in the Last Year: Never true    [place] in the Last Year: Sometimes true  Transportation Needs: No Transportation Needs (10/06/2021)   PRAPARE - Hydrologist (Medical): No    Lack of Transportation (Non-Medical): No  Physical Activity: Insufficiently Active (10/06/2021)   Exercise Vital Sign    Days of Exercise per Week: 2 days    Minutes of Exercise per Session: 10 min  Stress: No Stress Concern Present (09/29/2020)   [place]    Feeling of Stress : Not at all  Social Connections: Moderately Isolated (10/06/2021)   Social Connection and Isolation Panel [NHANES]    Frequency of Communication with Friends and Family: Three times a week    Frequency of Social Gatherings with Friends and Family: Three times a week    Attends Religious Services: 1 to 4 times per year    Active Member of Clubs or Organizations: No    Attends Archivist Meetings: Never    Marital Status: Widowed    Tobacco Counseling Counseling given: Not Answered   Clinical Intake:  Pre-visit preparation completed: No  Pain : No/denies pain        How often do you need to have someone help you when you read instructions, pamphlets, or other written materials from your doctor or pharmacy?: 1 - Never What is the last grade level you completed in school?: 12th  Diabetic?no         Activities of Daily Living    10/06/2021    2:30 PM  In your present state of health, do you have any difficulty  performing the following activities:  Hearing? 0  Vision? 0  Difficulty concentrating or making decisions? 0  Walking or climbing stairs? 0  Dressing or bathing? 0  Doing errands, shopping? 0  Preparing Food and eating ? N  Using the Toilet? N  In the past six months, have you accidently leaked urine? N  Do you have problems with loss of bowel control? N  Managing your Medications? N  Managing your Finances? N  Housekeeping or managing your Housekeeping? N    Patient Care Team: Lindell Spar, MD as PCP - General (Internal Medicine)  Indicate any  recent Medical Services you may have received from other than [facility] providers in the past year (date may be approximate).     Assessment:   This is a routine wellness examination for Betty Dawson.  Hearing/Vision screen No results found.  Dietary issues and exercise activities discussed: Exercise limited by: orthopedic condition(s)   Goals Addressed             This Visit's Progress    Prevent falls         Depression Screen    07/01/2021   10:15 AM 05/23/2021    9:52 AM 01/22/2021    9:52 AM 09/29/2020    9:49 AM 09/29/2020    9:47 AM 09/18/2020   10:35 AM 04/24/2020    8:09 AM  PHQ 2/9 Scores  PHQ - 2 Score 0 0 0 0 0 0 0    Fall Risk    10/06/2021    2:30 PM 07/01/2021   10:16 AM 05/23/2021    9:52 AM 01/22/2021    9:52 AM 09/29/2020    9:49 AM  Fall Risk   Falls in the past year? 0 0 0 0 0  Number falls in past yr: 0  0 0 0  Injury with Fall? 0  0 0 0  Risk for fall due to :   No Fall Risks No Fall Risks No Fall Risks  Follow up   Falls evaluation completed Falls evaluation completed Falls evaluation completed    [place]:  Any stairs in or around the home? No  If so, are there any without handrails? No  Home free of loose throw rugs in walkways, pet beds, electrical cords, etc? Yes  Adequate lighting in your home to reduce risk of falls? Yes   ASSISTIVE DEVICES UTILIZED TO  PREVENT FALLS:  Life alert? No  Use of a cane, walker or w/c? No  Grab bars in the bathroom? Yes  Shower chair or bench in shower? No  Elevated toilet seat or a handicapped toilet? No    Cognitive Function:    09/29/2020    9:50 AM  MMSE - Mini Mental State Exam  Not completed: Unable to complete        10/06/2021    2:32 PM 09/29/2020    9:51 AM  6CIT Screen  What Year? 0 points 0 points  What month? 0 points 0 points  What time? 0 points 0 points  Count back from 20 0 points 0 points  Months in reverse 0 points 0 points  Repeat phrase 2 points 0 points  Total Score 2 points 0 points    Immunizations Immunization History  Administered Date(s) Administered   Fluad Quad(high Dose 65+) 10/18/2019, 10/01/2020   Influenza,inj,Quad PF,6+ Mos 11/11/2012, 10/22/2015, 10/30/2016, 12/22/2017, 11/28/2018   Moderna Sars-Covid-2 Vaccination 04/20/2019, 05/17/2019, 12/29/2019   Pneumococcal Conjugate-13 04/24/2020   Pneumococcal Polysaccharide-23 05/23/2021   Tdap 03/28/2013   Zoster Recombinat (Shingrix) 04/24/2020, 09/18/2020   Zoster, Live 10/22/2015    TDAP status: Up to date  Flu Vaccine status: Due, Education has been provided regarding the importance of this vaccine. Advised may receive this vaccine at local pharmacy or Health Dept. Aware to provide a copy of the vaccination record if obtained from local pharmacy or Health Dept. Verbalized acceptance and understanding.  Pneumococcal vaccine status: Up to date  Covid-19 vaccine status: Completed vaccines  Qualifies for Shingles Vaccine? Yes   Zostavax completed No   Shingrix Completed?: Yes  Screening  Tests Health Maintenance  Topic Date Due   COVID-19 Vaccine (4 - Moderna series) 02/23/2020   INFLUENZA VACCINE  08/26/2021   MAMMOGRAM  10/11/2021   COLONOSCOPY (Pts 45-49yr Insurance coverage will need to be confirmed)  02/22/2023   TETANUS/TDAP  03/29/2023   Pneumonia Vaccine 67 Years old  Completed   DEXA SCAN   Completed   Hepatitis C Screening  Completed   Zoster Vaccines- Shingrix  Completed   HPV VACCINES  Aged Out    Health Maintenance  Health Maintenance Due  Topic Date Due   COVID-19 Vaccine (4 - Moderna series) 02/23/2020   INFLUENZA VACCINE  08/26/2021    Colorectal cancer screening: Type of screening: Colonoscopy. Completed 2015. Repeat every 10 years  Mammogram status: Completed 2022. Repeat every year  Bone Density status: Completed 2022. Results reflect: Bone density results: OSTEOPENIA. Repeat every 2 years.  Lung Cancer Screening: (Low Dose CT Chest recommended if Age 67-80years, 30 pack-year currently smoking OR have quit w/in 15years.) does not qualify.   Lung Cancer Screening Referral:   Additional Screening:  Hepatitis C Screening: does qualify; Completed   Vision Screening: Recommended annual ophthalmology exams for early detection of glaucoma and other disorders of the eye. Is the patient up to date with their annual eye exam?  Yes  Who is the provider or what is the name of the office in which the patient attends annual eye exams? [facility]  If pt is not established with a provider, would they like to be referred to a provider to establish care? No .   Dental Screening: Recommended annual dental exams for proper oral hygiene  Community Resource Referral / Chronic Care Management: CRR required this visit?  No   CCM required this visit?  No      Plan:     I have personally reviewed and noted the following in the patient's chart:   Medical and social history Use of alcohol, tobacco or illicit drugs  Current medications and supplements including opioid prescriptions. Patient is not currently taking opioid prescriptions. Functional ability and status Nutritional status Physical activity Advanced directives List of other physicians Hospitalizations, surgeries, and ER visits in previous 12 months Vitals Screenings to include cognitive, depression,  and falls Referrals and appointments  In addition, I have reviewed and discussed with patient certain preventive protocols, quality metrics, and best practice recommendations. A written personalized care plan for preventive services as well as general preventive health recommendations were provided to patient.     BEual Fines LPN   [id number]/[id number]  Nurse Notes: Schedule your next wellness visit for 1 year at checkout

## 2021-10-07 ENCOUNTER — Encounter: Payer: Self-pay | Admitting: Internal Medicine

## 2021-10-07 ENCOUNTER — Ambulatory Visit (INDEPENDENT_AMBULATORY_CARE_PROVIDER_SITE_OTHER): Payer: Medicare PPO | Admitting: Internal Medicine

## 2021-10-07 VITALS — BP 108/82 | HR 61 | Resp 18 | Ht 68.0 in | Wt 192.8 lb

## 2021-10-07 DIAGNOSIS — Z0001 Encounter for general adult medical examination with abnormal findings: Secondary | ICD-10-CM | POA: Diagnosis not present

## 2021-10-07 DIAGNOSIS — Z23 Encounter for immunization: Secondary | ICD-10-CM

## 2021-10-07 DIAGNOSIS — I10 Essential (primary) hypertension: Secondary | ICD-10-CM

## 2021-10-07 DIAGNOSIS — D508 Other iron deficiency anemias: Secondary | ICD-10-CM

## 2021-10-07 DIAGNOSIS — I83893 Varicose veins of bilateral lower extremities with other complications: Secondary | ICD-10-CM | POA: Diagnosis not present

## 2021-10-07 DIAGNOSIS — M5136 Other intervertebral disc degeneration, lumbar region: Secondary | ICD-10-CM

## 2021-10-07 NOTE — Assessment & Plan Note (Signed)
On iron supplements, has constipation with it, advised to take at least QOD Hb stable

## 2021-10-07 NOTE — Assessment & Plan Note (Signed)

## 2021-10-07 NOTE — Patient Instructions (Signed)
Please continue taking medications as prescribed.  Please continue to follow low salt diet and moderate exercise/walking at least 150 mins/week.

## 2021-10-07 NOTE — Assessment & Plan Note (Signed)
Has chronic LE swelling, currently better Advised to use compression stockings Leg elevation as tolerated Has seen vascular surgery in the past, recommended conservative management

## 2021-10-07 NOTE — Assessment & Plan Note (Signed)
BP Readings from Last 1 Encounters:  10/07/21 108/82   Well-controlled with Amlodipine and HCTZ Counseled for compliance with the medications Advised DASH diet and moderate exercise/walking, at least 150 mins/week

## 2021-10-07 NOTE — Assessment & Plan Note (Signed)
Chronic low back pain radiating to left LE On Gabapentin and PRN Tylenol Flexeril 10 mg PRN for muscle spasms MRI lumbar spine reviewed Has seen spine surgeon Has done physical therapy

## 2021-10-07 NOTE — Progress Notes (Addendum)
Established Patient Office Visit  Subjective:  Patient ID: ICA DAYE, female    DOB: Dec 15, 1955  Age: 66 y.o. MRN: [id number]  CC:  Chief Complaint  Patient presents with   Annual Exam    Annual exam     HPI SHEELAH RITACCO is a 66 y.o. female with past medical history of HTN, OA, DDD of lumbar spine and iron deficiency anemia who presents for annual physical.  HTN: BP is well-controlled. Takes medications regularly. Patient denies headache, dizziness, chest pain, dyspnea or palpitations.  Chronic low back pain: She has chronic, intermittent low back pain, radiating to b/l LE with numbness of left foot, worse with movement and walking and better with rest.  She has done physical therapy for low back pain and LE weakness, which has slowly improved her symptoms. She has seen spine surgery for DDD of lumbar spine and was advised PT for now.  She denies any recent fall or injury.  Blood tests were reviewed and discussed with the patient in detail.    Past Medical History:  Diagnosis Date   Anemia    Arthritis    OA multiple joints   Heart murmur    Heel spur    Hypertension    Varicose veins     Past Surgical History:  Procedure Laterality Date   ABDOMINAL HYSTERECTOMY     endometriosis   BREAST BIOPSY Right 2017   x2   COLONOSCOPY  02/21/2013   [id number] polyp measuring 6 mm in size was found in the distal/transverse colon; polypectomy was performed using snare cautery/The colon was redundant/The colon mucosa was otherwise normal/Normal mucosa in the terminal ileum   COLONOSCOPY N/A 02/21/2013   Procedure: COLONOSCOPY;  Surgeon: Danie Binder, MD;  [facility];  Service: Endoscopy;  Laterality: N/A;  10:30 AM   FOOT SURGERY Right    heel spur   SHOULDER ARTHROSCOPY WITH ROTATOR CUFF REPAIR Right 01/30/2016   Procedure: SHOULDER ARTHROSCOPY WITH ACROMIOPLASTY;  Surgeon: Carole Civil, MD;  Location: [facility];  Service: Orthopedics;  Laterality: Right;   pt knows to arrive at [place] Right 01/30/2016   Procedure: [place] OPEN;  Surgeon: Carole Civil, MD;  Location: [facility];  Service: Orthopedics;  Laterality: Right;    Family History  Problem Relation Age of Onset   Hypertension Mother    Cancer Mother    Diabetes Sister    Hyperlipidemia Sister    Hypertension Sister    Diabetes Brother    Hypertension Brother     Social History   Socioeconomic History   Marital status: Single    Spouse name: Not on file   Number of children: Not on file   Years of education: Not on file   Highest education level: Not on file  Occupational History   Not on file  Tobacco Use   Smoking status: Never   Smokeless tobacco: Never  Substance and Sexual Activity   Alcohol use: No   Drug use: No   Sexual activity: Not Currently    Birth control/protection: Surgical  Other Topics Concern   Not on file  Social History Narrative   Not on file   Social Determinants of Health   Financial Resource Strain: Low Risk  (10/06/2021)   Overall Financial Resource Strain (CARDIA)    Difficulty of Paying Living Expenses: Not very hard  Food Insecurity: Food Insecurity Present (07/01/2021)   Hunger  Vital Sign    Worried About Charity fundraiser in the Last Year: Never true    Ran Out of Food in the Last Year: Sometimes true  Transportation Needs: No Transportation Needs (10/06/2021)   PRAPARE - Hydrologist (Medical): No    Lack of Transportation (Non-Medical): No  Physical Activity: Insufficiently Active (10/06/2021)   Exercise Vital Sign    Days of Exercise per Week: 2 days    Minutes of Exercise per Session: 10 min  Stress: No Stress Concern Present (09/29/2020)   [place]    Feeling of Stress : Not at all  Social Connections: Moderately Isolated (10/06/2021)   Social Connection and Isolation Panel  [NHANES]    Frequency of Communication with Friends and Family: Three times a week    Frequency of Social Gatherings with Friends and Family: Three times a week    Attends Religious Services: 1 to 4 times per year    Active Member of Clubs or Organizations: No    Attends Archivist Meetings: Never    Marital Status: Widowed  Intimate Partner Violence: Not At Risk (09/29/2020)   Humiliation, Afraid, Rape, and Kick questionnaire    Fear of Current or Ex-Partner: No    Emotionally Abused: No    Physically Abused: No    Sexually Abused: No    Outpatient Medications Prior to Visit  Medication Sig Dispense Refill   acetaminophen (TYLENOL) 500 MG tablet Take 1,000 mg by mouth every 6 (six) hours as needed (pain).      cyclobenzaprine (FLEXERIL) 10 MG tablet TAKE 1 TABLET BY MOUTH TWICE A DAY AS NEEDED FOR MUSCLE SPASM 30 tablet 1   Ferrous Sulfate Dried 143 (45 Fe) MG TBCR Take 1 tablet by mouth daily. 30 tablet 3   gabapentin (NEURONTIN) 300 MG capsule Take 1 capsule (300 mg total) by mouth at bedtime. 30 capsule 5   ibuprofen (ADVIL,MOTRIN) 800 MG tablet Take 1 tablet (800 mg total) by mouth every 8 (eight) hours as needed. 90 tablet 5   amLODipine (NORVASC) 5 MG tablet TAKE 1 TABLET (5 MG TOTAL) BY MOUTH DAILY. 90 tablet 1   hydrochlorothiazide (HYDRODIURIL) 25 MG tablet TAKE 1 TABLET (25 MG TOTAL) BY MOUTH DAILY. 90 tablet 1   No facility-administered medications prior to visit.    Allergies  Allergen Reactions   Lisinopril Swelling    ROS Review of Systems  Constitutional:  Negative for chills and fever.  HENT:  Negative for congestion, sinus pressure, sinus pain and sore throat.   Eyes:  Negative for pain and discharge.  Respiratory:  Negative for cough and shortness of breath.   Cardiovascular:  Negative for chest pain and palpitations.  Gastrointestinal:  Negative for abdominal pain, constipation, diarrhea, nausea and vomiting.  Endocrine: Negative for polydipsia and  polyuria.  Genitourinary:  Negative for dysuria and hematuria.  Musculoskeletal:  Positive for arthralgias and back pain. Negative for neck pain and neck stiffness.  Skin:  Negative for rash.  Neurological:  Negative for dizziness and weakness.  Psychiatric/Behavioral:  Negative for agitation and behavioral problems.       Objective:    Physical Exam Vitals reviewed.  Constitutional:      General: She is not in acute distress.    Appearance: She is not diaphoretic.  HENT:     Head: Normocephalic and atraumatic.     Nose: Nose normal.  Mouth/Throat:     Mouth: Mucous membranes are moist.  Eyes:     General: No scleral icterus.    Extraocular Movements: Extraocular movements intact.  Neck:     Vascular: No carotid bruit.  Cardiovascular:     Rate and Rhythm: Normal rate and regular rhythm.     Pulses: Normal pulses.     Heart sounds: Normal heart sounds. No murmur heard. Pulmonary:     Breath sounds: Normal breath sounds. No wheezing or rales.  Abdominal:     Palpations: Abdomen is soft.     Tenderness: There is no abdominal tenderness.  Musculoskeletal:     Cervical back: Neck supple. No tenderness.     Right lower leg: No edema.     Left lower leg: No edema.  Skin:    General: Skin is warm.     Findings: No rash.  Neurological:     General: No focal deficit present.     Mental Status: She is alert and oriented to person, place, and time.     Cranial Nerves: No cranial nerve deficit.     Sensory: No sensory deficit.     Motor: No weakness.  Psychiatric:        Mood and Affect: Mood normal.        Behavior: Behavior normal.     BP 108/82 (BP Location: Right Arm, Patient Position: Sitting, Cuff Size: Normal)   Pulse 61   Resp 18   Ht '5\' 8"'$  (1.727 m)   Wt 192 lb 12.8 oz (87.5 kg)   SpO2 96%   BMI 29.32 kg/m  Wt Readings from Last 3 Encounters:  04/08/22 192 lb (87.1 kg)  10/07/21 192 lb 12.8 oz (87.5 kg)  05/23/21 196 lb 3.2 oz (89 kg)    Lab Results   Component Value Date   TSH 2.420 10/01/2021   Lab Results  Component Value Date   WBC 5.4 10/01/2021   HGB 11.1 10/01/2021   HCT 33.7 (L) 10/01/2021   MCV 83 10/01/2021   PLT 288 10/01/2021   Lab Results  Component Value Date   NA 140 10/01/2021   K 4.2 10/01/2021   CO2 21 10/01/2021   GLUCOSE 101 (H) 10/01/2021   BUN 12 10/01/2021   CREATININE 0.57 10/01/2021   BILITOT 0.3 10/01/2021   ALKPHOS 103 10/01/2021   AST 16 10/01/2021   ALT 9 10/01/2021   PROT 6.9 10/01/2021   ALBUMIN 4.0 10/01/2021   CALCIUM 9.2 10/01/2021   ANIONGAP 7 01/28/2016   EGFR 100 10/01/2021   Lab Results  Component Value Date   CHOL 141 10/01/2021   Lab Results  Component Value Date   HDL 64 10/01/2021   Lab Results  Component Value Date   LDLCALC 66 10/01/2021   Lab Results  Component Value Date   TRIG 50 10/01/2021   Lab Results  Component Value Date   CHOLHDL 2.2 10/01/2021   Lab Results  Component Value Date   HGBA1C 5.7 (H) 10/01/2021      Assessment & Plan:   Problem List Items Addressed This Visit       Cardiovascular and Mediastinum   HTN (hypertension)    BP Readings from Last 1 Encounters:  10/07/21 108/82  Well-controlled with Amlodipine and HCTZ Counseled for compliance with the medications Advised DASH diet and moderate exercise/walking, at least 150 mins/week      Varicose veins of leg with edema, bilateral    Has chronic LE swelling, currently  better Advised to use compression stockings Leg elevation as tolerated Has seen vascular surgery in the past, recommended conservative management        Musculoskeletal and Integument   DDD (degenerative disc disease), lumbar    Chronic low back pain radiating to left LE On Gabapentin and PRN Tylenol Flexeril 10 mg PRN for muscle spasms MRI lumbar spine reviewed Has seen spine surgeon Has done physical therapy        Other   Anemia, iron deficiency    On iron supplements, has constipation with it,  advised to take at least QOD Hb stable      Encounter for general adult medical examination with abnormal findings - Primary    Physical exam as documented. Counseling done  re healthy lifestyle involving commitment to 150 minutes exercise per week, heart healthy diet, and attaining healthy weight.The importance of adequate sleep also discussed. Changes in health habits are decided on by the patient with goals and time frames  set for achieving them. Immunization and cancer screening needs are specifically addressed at this visit.      Other Visit Diagnoses     Need for immunization against influenza       Relevant Orders   Flu Vaccine QUAD High Dose(Fluad) (Completed)       No orders of the defined types were placed in this encounter.   Follow-up: Return in about 6 months (around 04/07/2022) for HTN.    Lindell Spar, MD

## 2021-10-20 ENCOUNTER — Ambulatory Visit (HOSPITAL_COMMUNITY)
Admission: RE | Admit: 2021-10-20 | Discharge: 2021-10-20 | Disposition: A | Discharge status: Home / Self Care | Payer: Medicare PPO | Source: Ambulatory Visit | Attending: Internal Medicine | Admitting: Internal Medicine

## 2021-10-20 DIAGNOSIS — Z1231 Encounter for screening mammogram for malignant neoplasm of breast: Secondary | ICD-10-CM | POA: Diagnosis not present

## 2022-03-31 ENCOUNTER — Other Ambulatory Visit: Payer: Self-pay | Admitting: Internal Medicine

## 2022-03-31 DIAGNOSIS — I10 Essential (primary) hypertension: Secondary | ICD-10-CM

## 2022-04-08 ENCOUNTER — Ambulatory Visit (INDEPENDENT_AMBULATORY_CARE_PROVIDER_SITE_OTHER): Payer: Medicare HMO | Admitting: Internal Medicine

## 2022-04-08 ENCOUNTER — Telehealth: Payer: Self-pay | Admitting: Internal Medicine

## 2022-04-08 ENCOUNTER — Encounter: Payer: Self-pay | Admitting: Internal Medicine

## 2022-04-08 VITALS — BP 148/78 | HR 58 | Ht 68.0 in | Wt 192.0 lb

## 2022-04-08 DIAGNOSIS — I83893 Varicose veins of bilateral lower extremities with other complications: Secondary | ICD-10-CM | POA: Diagnosis not present

## 2022-04-08 DIAGNOSIS — M5136 Other intervertebral disc degeneration, lumbar region: Secondary | ICD-10-CM

## 2022-04-08 DIAGNOSIS — I10 Essential (primary) hypertension: Secondary | ICD-10-CM | POA: Diagnosis not present

## 2022-04-08 MED ORDER — AMLODIPINE BESYLATE 10 MG PO TABS: 10.0000 mg | ORAL_TABLET | Freq: Every day | ORAL | 1 refills | Status: DC

## 2022-04-08 NOTE — Patient Instructions (Signed)
Please start taking Amlodipine 10 mg once daily.  Please continue taking other medications as prescribed.  Please continue to follow DASH diet and ambulate as tolerated.

## 2022-04-08 NOTE — Assessment & Plan Note (Addendum)
Had chronic LE swelling, currently better Advised to use compression stockings Leg elevation as tolerated Has seen vascular surgery in the past, recommended conservative management

## 2022-04-08 NOTE — Telephone Encounter (Signed)
No message needed °

## 2022-04-08 NOTE — Progress Notes (Signed)
Established Patient Office Visit  Subjective:  Patient ID: Betty Dawson, female    DOB: 1956/01/19  Age: 67 y.o. MRN: [id number]  CC:  Chief Complaint  Patient presents with   Hypertension    Six month follow up    HPI Betty Dawson is a 67 y.o. female with past medical history of HTN, OA, DDD of lumbar spine and iron deficiency anemia who presents for f/u of her chronic medical conditions.  HTN: BP is elevated today. Takes medications - amlodipine 5 mg QD and HCTZ 25 mg QD regularly. Patient denies headache, dizziness, chest pain, dyspnea or palpitations.  Chronic low back pain: She has chronic, intermittent low back pain, radiating to b/l LE with numbness of left foot, worse with movement and walking and better with rest.  She has done physical therapy for low back pain and LE weakness, which has slowly improved her symptoms. She has seen spine surgery for DDD of lumbar spine and was advised PT for now.  She denies any recent fall or injury. She takes Flexeril PRN for muscle spasms.    Past Medical History:  Diagnosis Date   Anemia    Arthritis    OA multiple joints   Heart murmur    Heel spur    Hypertension    Varicose veins     Past Surgical History:  Procedure Laterality Date   ABDOMINAL HYSTERECTOMY     endometriosis   BREAST BIOPSY Right 2017   x2   COLONOSCOPY  02/21/2013   [id number] polyp measuring 6 mm in size was found in the distal/transverse colon; polypectomy was performed using snare cautery/The colon was redundant/The colon mucosa was otherwise normal/Normal mucosa in the terminal ileum   COLONOSCOPY N/A 02/21/2013   Procedure: COLONOSCOPY;  Surgeon: Danie Binder, MD;  [facility];  Service: Endoscopy;  Laterality: N/A;  10:30 AM   FOOT SURGERY Right    heel spur   SHOULDER ARTHROSCOPY WITH ROTATOR CUFF REPAIR Right 01/30/2016   Procedure: SHOULDER ARTHROSCOPY WITH ACROMIOPLASTY;  Surgeon: Carole Civil, MD;  Location: [facility];   Service: Orthopedics;  Laterality: Right;  pt knows to arrive at [place] Right 01/30/2016   Procedure: [place] OPEN;  Surgeon: Carole Civil, MD;  Location: [facility];  Service: Orthopedics;  Laterality: Right;    Family History  Problem Relation Age of Onset   Hypertension Mother    Cancer Mother    Diabetes Sister    Hyperlipidemia Sister    Hypertension Sister    Diabetes Brother    Hypertension Brother     Social History   Socioeconomic History   Marital status: Single    Spouse name: Not on file   Number of children: Not on file   Years of education: Not on file   Highest education level: Not on file  Occupational History   Not on file  Tobacco Use   Smoking status: Never   Smokeless tobacco: Never  Substance and Sexual Activity   Alcohol use: No   Drug use: No   Sexual activity: Not Currently    Birth control/protection: Surgical  Other Topics Concern   Not on file  Social History Narrative   Not on file   Social Determinants of Health   Financial Resource Strain: Low Risk  (10/06/2021)   Overall Financial Resource Strain (CARDIA)    Difficulty of Paying Living Expenses: Not very hard  Food Insecurity: Food Insecurity Present (07/01/2021)   Hunger Vital Sign    Worried About Running Out of Food in the Last Year: Never true    Ran Out of Food in the Last Year: Sometimes true  Transportation Needs: No Transportation Needs (10/06/2021)   PRAPARE - Hydrologist (Medical): No    Lack of Transportation (Non-Medical): No  Physical Activity: Insufficiently Active (10/06/2021)   Exercise Vital Sign    Days of Exercise per Week: 2 days    Minutes of Exercise per Session: 10 min  Stress: No Stress Concern Present (09/29/2020)   [place]    Feeling of Stress : Not at all  Social Connections: Moderately Isolated (10/06/2021)    Social Connection and Isolation Panel [NHANES]    Frequency of Communication with Friends and Family: Three times a week    Frequency of Social Gatherings with Friends and Family: Three times a week    Attends Religious Services: 1 to 4 times per year    Active Member of Clubs or Organizations: No    Attends Archivist Meetings: Never    Marital Status: Widowed  Intimate Partner Violence: Not At Risk (09/29/2020)   Humiliation, Afraid, Rape, and Kick questionnaire    Fear of Current or Ex-Partner: No    Emotionally Abused: No    Physically Abused: No    Sexually Abused: No    Outpatient Medications Prior to Visit  Medication Sig Dispense Refill   acetaminophen (TYLENOL) 500 MG tablet Take 1,000 mg by mouth every 6 (six) hours as needed (pain).      cyclobenzaprine (FLEXERIL) 10 MG tablet TAKE 1 TABLET BY MOUTH TWICE A DAY AS NEEDED FOR MUSCLE SPASM 30 tablet 1   Ferrous Sulfate Dried 143 (45 Fe) MG TBCR Take 1 tablet by mouth daily. 30 tablet 3   gabapentin (NEURONTIN) 300 MG capsule Take 1 capsule (300 mg total) by mouth at bedtime. 30 capsule 5   hydrochlorothiazide (HYDRODIURIL) 25 MG tablet TAKE 1 TABLET (25 MG TOTAL) BY MOUTH DAILY. 90 tablet 1   ibuprofen (ADVIL,MOTRIN) 800 MG tablet Take 1 tablet (800 mg total) by mouth every 8 (eight) hours as needed. 90 tablet 5   amLODipine (NORVASC) 5 MG tablet TAKE 1 TABLET (5 MG TOTAL) BY MOUTH DAILY. 90 tablet 1   No facility-administered medications prior to visit.    Allergies  Allergen Reactions   Lisinopril Swelling    ROS Review of Systems  Constitutional:  Negative for chills and fever.  HENT:  Negative for congestion, sinus pressure, sinus pain and sore throat.   Eyes:  Negative for pain and discharge.  Respiratory:  Negative for cough and shortness of breath.   Cardiovascular:  Negative for chest pain and palpitations.  Gastrointestinal:  Negative for abdominal pain, constipation, diarrhea, nausea and vomiting.   Endocrine: Negative for polydipsia and polyuria.  Genitourinary:  Negative for dysuria and hematuria.  Musculoskeletal:  Positive for arthralgias and back pain. Negative for neck pain and neck stiffness.  Skin:  Negative for rash.  Neurological:  Negative for dizziness and weakness.  Psychiatric/Behavioral:  Negative for agitation and behavioral problems.       Objective:    Physical Exam Vitals reviewed.  Constitutional:      General: She is not in acute distress.    Appearance: She is not diaphoretic.  HENT:     Head: Normocephalic and atraumatic.  Nose: Nose normal.     Mouth/Throat:     Mouth: Mucous membranes are moist.  Eyes:     General: No scleral icterus.    Extraocular Movements: Extraocular movements intact.  Neck:     Vascular: No carotid bruit.  Cardiovascular:     Rate and Rhythm: Normal rate and regular rhythm.     Heart sounds: Normal heart sounds. No murmur heard. Pulmonary:     Breath sounds: Normal breath sounds. No wheezing or rales.  Musculoskeletal:     Cervical back: Neck supple. No tenderness.     Right lower leg: No edema.     Left lower leg: No edema.  Skin:    General: Skin is warm.     Findings: No rash.  Neurological:     General: No focal deficit present.     Mental Status: She is alert and oriented to person, place, and time.     Sensory: No sensory deficit.     Motor: No weakness.  Psychiatric:        Mood and Affect: Mood normal.        Behavior: Behavior normal.     BP (!) 148/78 (BP Location: Left Arm, Cuff Size: Normal)   Pulse (!) 58   Ht '5\' 8"'$  (1.727 m)   Wt 192 lb (87.1 kg)   SpO2 97%   BMI 29.19 kg/m  Wt Readings from Last 3 Encounters:  04/08/22 192 lb (87.1 kg)  10/07/21 192 lb 12.8 oz (87.5 kg)  05/23/21 196 lb 3.2 oz (89 kg)    Lab Results  Component Value Date   TSH 2.420 10/01/2021   Lab Results  Component Value Date   WBC 5.4 10/01/2021   HGB 11.1 10/01/2021   HCT 33.7 (L) 10/01/2021   MCV 83  10/01/2021   PLT 288 10/01/2021   Lab Results  Component Value Date   NA 140 10/01/2021   K 4.2 10/01/2021   CO2 21 10/01/2021   GLUCOSE 101 (H) 10/01/2021   BUN 12 10/01/2021   CREATININE 0.57 10/01/2021   BILITOT 0.3 10/01/2021   ALKPHOS 103 10/01/2021   AST 16 10/01/2021   ALT 9 10/01/2021   PROT 6.9 10/01/2021   ALBUMIN 4.0 10/01/2021   CALCIUM 9.2 10/01/2021   ANIONGAP 7 01/28/2016   EGFR 100 10/01/2021   Lab Results  Component Value Date   CHOL 141 10/01/2021   Lab Results  Component Value Date   HDL 64 10/01/2021   Lab Results  Component Value Date   LDLCALC 66 10/01/2021   Lab Results  Component Value Date   TRIG 50 10/01/2021   Lab Results  Component Value Date   CHOLHDL 2.2 10/01/2021   Lab Results  Component Value Date   HGBA1C 5.7 (H) 10/01/2021      Assessment & Plan:   Problem List Items Addressed This Visit       Cardiovascular and Mediastinum   HTN (hypertension) - Primary    BP Readings from Last 1 Encounters:  04/08/22 (!) 148/78  Uncontrolled with Amlodipine and HCTZ Increased dose of Amlodipine to 10 mg QD Counseled for compliance with the medications Advised DASH diet and moderate exercise/walking, at least 150 mins/week      Relevant Medications   amLODipine (NORVASC) 10 MG tablet   Varicose veins of leg with edema, bilateral    Had chronic LE swelling, currently better Advised to use compression stockings Leg elevation as tolerated Has seen vascular surgery in  the past, recommended conservative management      Relevant Medications   amLODipine (NORVASC) 10 MG tablet     Musculoskeletal and Integument   DDD (degenerative disc disease), lumbar    Chronic low back pain radiating to left LE On Gabapentin and PRN Tylenol, but does not take it currently Flexeril 10 mg PRN for muscle spasms MRI lumbar spine reviewed Has seen spine surgeon Has done physical therapy       Meds ordered this encounter  Medications    amLODipine (NORVASC) 10 MG tablet    Sig: Take 1 tablet (10 mg total) by mouth daily.    Dispense:  90 tablet    Refill:  1    Follow-up: Return in about 2 months (around 06/08/2022) for HTN.    Lindell Spar, MD

## 2022-04-08 NOTE — Assessment & Plan Note (Signed)
BP Readings from Last 1 Encounters:  04/08/22 (!) 148/78   Uncontrolled with Amlodipine and HCTZ Increased dose of Amlodipine to 10 mg QD Counseled for compliance with the medications Advised DASH diet and moderate exercise/walking, at least 150 mins/week

## 2022-04-08 NOTE — Assessment & Plan Note (Addendum)
Chronic low back pain radiating to left LE On Gabapentin and PRN Tylenol, but does not take it currently Flexeril 10 mg PRN for muscle spasms MRI lumbar spine reviewed Has seen spine surgeon Has done physical therapy

## 2022-06-10 ENCOUNTER — Ambulatory Visit (INDEPENDENT_AMBULATORY_CARE_PROVIDER_SITE_OTHER): Payer: Medicare PPO | Admitting: Internal Medicine

## 2022-06-10 ENCOUNTER — Encounter: Payer: Self-pay | Admitting: Internal Medicine

## 2022-06-10 VITALS — BP 137/78 | HR 66 | Ht 68.0 in | Wt 191.4 lb

## 2022-06-10 DIAGNOSIS — M51369 Other intervertebral disc degeneration, lumbar region without mention of lumbar back pain or lower extremity pain: Secondary | ICD-10-CM

## 2022-06-10 DIAGNOSIS — E782 Mixed hyperlipidemia: Secondary | ICD-10-CM | POA: Diagnosis not present

## 2022-06-10 DIAGNOSIS — R739 Hyperglycemia, unspecified: Secondary | ICD-10-CM

## 2022-06-10 DIAGNOSIS — M5136 Other intervertebral disc degeneration, lumbar region: Secondary | ICD-10-CM

## 2022-06-10 DIAGNOSIS — E559 Vitamin D deficiency, unspecified: Secondary | ICD-10-CM | POA: Diagnosis not present

## 2022-06-10 DIAGNOSIS — D508 Other iron deficiency anemias: Secondary | ICD-10-CM

## 2022-06-10 DIAGNOSIS — I10 Essential (primary) hypertension: Secondary | ICD-10-CM

## 2022-06-10 NOTE — Progress Notes (Signed)
Established Patient Office Visit  Subjective:  Patient ID: Betty Dawson, female    DOB: 23-Sep-1955  Age: 67 y.o. MRN: [id number]  CC:  Chief Complaint  Patient presents with   Hypertension    Two month follow up     HPI Betty Dawson is a 67 y.o. female with past medical history of HTN, OA, DDD of lumbar spine and iron deficiency anemia who presents for f/u of her chronic medical conditions.  HTN: BP is wnl today. She has brought home BP readings, which are overall well-controlled.  Takes medications - amlodipine 10 mg QD and HCTZ 25 mg QD regularly. Patient denies headache, dizziness, chest pain, dyspnea or palpitations.  Chronic low back pain: She has chronic, intermittent low back pain, radiating to b/l LE with numbness of left foot, worse with movement and walking and better with rest.  She has done physical therapy for low back pain and LE weakness, which has slowly improved her symptoms. She has seen spine surgery for DDD of lumbar spine and was advised PT for now.  She denies any recent fall or injury. She takes Flexeril PRN for muscle spasms.    Past Medical History:  Diagnosis Date   Anemia    Arthritis    OA multiple joints   Heart murmur    Heel spur    Hypertension    Varicose veins     Past Surgical History:  Procedure Laterality Date   ABDOMINAL HYSTERECTOMY     endometriosis   BREAST BIOPSY Right 2017   x2   COLONOSCOPY  02/21/2013   XBJ:[id number] polyp measuring 6 mm in size was found in the distal/transverse colon; polypectomy was performed using snare cautery/The colon was redundant/The colon mucosa was otherwise normal/Normal mucosa in the terminal ileum   COLONOSCOPY N/A 02/21/2013   Procedure: COLONOSCOPY;  Surgeon: West Bali, MD;  Location: [facility];  Service: Endoscopy;  Laterality: N/A;  10:30 AM   FOOT SURGERY Right    heel spur   SHOULDER ARTHROSCOPY WITH ROTATOR CUFF REPAIR Right 01/30/2016   Procedure: SHOULDER ARTHROSCOPY WITH  ACROMIOPLASTY;  Surgeon: Vickki Hearing, MD;  Location: [facility];  Service: Orthopedics;  Laterality: Right;  pt knows to arrive at 7:15   SHOULDER OPEN ROTATOR CUFF REPAIR Right 01/30/2016   Procedure: ROTATOR CUFF REPAIR SHOULDER OPEN;  Surgeon: Vickki Hearing, MD;  Location: [facility];  Service: Orthopedics;  Laterality: Right;    Family History  Problem Relation Age of Onset   Hypertension Mother    Cancer Mother    Diabetes Sister    Hyperlipidemia Sister    Hypertension Sister    Diabetes Brother    Hypertension Brother     Social History   Socioeconomic History   Marital status: Single    Spouse name: Not on file   Number of children: Not on file   Years of education: Not on file   Highest education level: Not on file  Occupational History   Not on file  Tobacco Use   Smoking status: Never   Smokeless tobacco: Never  Substance and Sexual Activity   Alcohol use: No   Drug use: No   Sexual activity: Not Currently    Birth control/protection: Surgical  Other Topics Concern   Not on file  Social History Narrative   Not on file   Social Determinants of Health   Financial Resource Strain: Low Risk  (10/06/2021)   Overall Financial Resource Strain (  CARDIA)    Difficulty of Paying Living Expenses: Not very hard  Food Insecurity: Food Insecurity Present (07/01/2021)   Hunger Vital Sign    Worried About Running Out of Food in the Last Year: Never true    Ran Out of Food in the Last Year: Sometimes true  Transportation Needs: No Transportation Needs (10/06/2021)   PRAPARE - Administrator, Civil Service (Medical): No    Lack of Transportation (Non-Medical): No  Physical Activity: Insufficiently Active (10/06/2021)   Exercise Vital Sign    Days of Exercise per Week: 2 days    Minutes of Exercise per Session: 10 min  Stress: No Stress Concern Present (09/29/2020)   [employer] of Occupational Health - Occupational Stress Questionnaire    Feeling of  Stress : Not at all  Social Connections: Moderately Isolated (10/06/2021)   Social Connection and Isolation Panel [NHANES]    Frequency of Communication with Friends and Family: Three times a week    Frequency of Social Gatherings with Friends and Family: Three times a week    Attends Religious Services: 1 to 4 times per year    Active Member of Clubs or Organizations: No    Attends Banker Meetings: Never    Marital Status: Widowed  Intimate Partner Violence: Not At Risk (09/29/2020)   Humiliation, Afraid, Rape, and Kick questionnaire    Fear of Current or Ex-Partner: No    Emotionally Abused: No    Physically Abused: No    Sexually Abused: No    Outpatient Medications Prior to Visit  Medication Sig Dispense Refill   acetaminophen (TYLENOL) 500 MG tablet Take 1,000 mg by mouth every 6 (six) hours as needed (pain).      amLODipine (NORVASC) 10 MG tablet Take 1 tablet (10 mg total) by mouth daily. 90 tablet 1   cyclobenzaprine (FLEXERIL) 10 MG tablet TAKE 1 TABLET BY MOUTH TWICE A DAY AS NEEDED FOR MUSCLE SPASM 30 tablet 1   Ferrous Sulfate Dried 143 (45 Fe) MG TBCR Take 1 tablet by mouth daily. 30 tablet 3   gabapentin (NEURONTIN) 300 MG capsule Take 1 capsule (300 mg total) by mouth at bedtime. 30 capsule 5   hydrochlorothiazide (HYDRODIURIL) 25 MG tablet TAKE 1 TABLET (25 MG TOTAL) BY MOUTH DAILY. 90 tablet 1   ibuprofen (ADVIL,MOTRIN) 800 MG tablet Take 1 tablet (800 mg total) by mouth every 8 (eight) hours as needed. 90 tablet 5   No facility-administered medications prior to visit.    Allergies  Allergen Reactions   Lisinopril Swelling    ROS Review of Systems  Constitutional:  Negative for chills and fever.  HENT:  Negative for congestion, sinus pressure, sinus pain and sore throat.   Eyes:  Negative for pain and discharge.  Respiratory:  Negative for cough and shortness of breath.   Cardiovascular:  Negative for chest pain and palpitations.   Gastrointestinal:  Negative for abdominal pain, constipation, diarrhea, nausea and vomiting.  Endocrine: Negative for polydipsia and polyuria.  Genitourinary:  Negative for dysuria and hematuria.  Musculoskeletal:  Positive for arthralgias and back pain. Negative for neck pain and neck stiffness.  Skin:  Negative for rash.  Neurological:  Negative for dizziness and weakness.  Psychiatric/Behavioral:  Negative for agitation and behavioral problems.       Objective:    Physical Exam Vitals reviewed.  Constitutional:      General: She is not in acute distress.    Appearance: She is not  diaphoretic.  HENT:     Head: Normocephalic and atraumatic.     Nose: Nose normal.     Mouth/Throat:     Mouth: Mucous membranes are moist.  Eyes:     General: No scleral icterus.    Extraocular Movements: Extraocular movements intact.  Neck:     Vascular: No carotid bruit.  Cardiovascular:     Rate and Rhythm: Normal rate and regular rhythm.     Heart sounds: Normal heart sounds. No murmur heard. Pulmonary:     Breath sounds: Normal breath sounds. No wheezing or rales.  Musculoskeletal:     Cervical back: Neck supple. No tenderness.     Right lower leg: No edema.     Left lower leg: No edema.  Skin:    General: Skin is warm.     Findings: No rash.  Neurological:     General: No focal deficit present.     Mental Status: She is alert and oriented to person, place, and time.     Sensory: No sensory deficit.     Motor: No weakness.  Psychiatric:        Mood and Affect: Mood normal.        Behavior: Behavior normal.     BP 137/78 (BP Location: Right Arm, Patient Position: Sitting, Cuff Size: Normal)   Pulse 66   Ht 5\' 8"  (1.727 m)   Wt 191 lb 6.4 oz (86.8 kg)   SpO2 95%   BMI 29.10 kg/m  Wt Readings from Last 3 Encounters:  06/10/22 191 lb 6.4 oz (86.8 kg)  04/08/22 192 lb (87.1 kg)  10/07/21 192 lb 12.8 oz (87.5 kg)    Lab Results  Component Value Date   TSH 2.420 10/01/2021    Lab Results  Component Value Date   WBC 5.4 10/01/2021   HGB 11.1 10/01/2021   HCT 33.7 (L) 10/01/2021   MCV 83 10/01/2021   PLT 288 10/01/2021   Lab Results  Component Value Date   NA 140 10/01/2021   K 4.2 10/01/2021   CO2 21 10/01/2021   GLUCOSE 101 (H) 10/01/2021   BUN 12 10/01/2021   CREATININE 0.57 10/01/2021   BILITOT 0.3 10/01/2021   ALKPHOS 103 10/01/2021   AST 16 10/01/2021   ALT 9 10/01/2021   PROT 6.9 10/01/2021   ALBUMIN 4.0 10/01/2021   CALCIUM 9.2 10/01/2021   ANIONGAP 7 01/28/2016   EGFR 100 10/01/2021   Lab Results  Component Value Date   CHOL 141 10/01/2021   Lab Results  Component Value Date   HDL 64 10/01/2021   Lab Results  Component Value Date   LDLCALC 66 10/01/2021   Lab Results  Component Value Date   TRIG 50 10/01/2021   Lab Results  Component Value Date   CHOLHDL 2.2 10/01/2021   Lab Results  Component Value Date   HGBA1C 5.7 (H) 10/01/2021      Assessment & Plan:   Problem List Items Addressed This Visit       Cardiovascular and Mediastinum   HTN (hypertension) - Primary    BP Readings from Last 1 Encounters:  06/10/22 137/78  Well-controlled with Amlodipine and HCTZ Counseled for compliance with the medications Advised DASH diet and moderate exercise/walking, at least 150 mins/week      Relevant Orders   TSH   CMP14+EGFR   CBC with Differential/Platelet     Musculoskeletal and Integument   DDD (degenerative disc disease), lumbar    Chronic low  back pain radiating to left LE On Gabapentin and PRN Tylenol, but does not take it currently Flexeril 10 mg PRN for muscle spasms MRI lumbar spine reviewed Has seen spine surgeon Has done physical therapy        Other   Anemia, iron deficiency    On iron supplements, has constipation with it, advised to take at least QOD Hb stable      Relevant Orders   CBC with Differential/Platelet   Other Visit Diagnoses     Vitamin D deficiency       Relevant  Orders   VITAMIN D 25 Hydroxy (Vit-D Deficiency, Fractures)   Mixed hyperlipidemia       Relevant Orders   Lipid panel   Hyperglycemia       Relevant Orders   Hemoglobin A1c   CMP14+EGFR       No orders of the defined types were placed in this encounter.   Follow-up: Return in about 4 months (around 10/11/2022) for Annual physical.    Anabel Halon, MD

## 2022-06-10 NOTE — Assessment & Plan Note (Signed)
BP Readings from Last 1 Encounters:  06/10/22 137/78   Well-controlled with Amlodipine and HCTZ Counseled for compliance with the medications Advised DASH diet and moderate exercise/walking, at least 150 mins/week

## 2022-06-10 NOTE — Assessment & Plan Note (Signed)
On iron supplements, has constipation with it, advised to take at least QOD Hb stable 

## 2022-06-10 NOTE — Assessment & Plan Note (Signed)
Chronic low back pain radiating to left LE On Gabapentin and PRN Tylenol, but does not take it currently Flexeril 10 mg PRN for muscle spasms MRI lumbar spine reviewed Has seen spine surgeon Has done physical therapy 

## 2022-06-10 NOTE — Patient Instructions (Signed)
Please continue to take medications as prescribed.  Please continue to follow low salt diet and perform moderate exercise/walking at least 150 mins/week.  Please get fasting blood tests done before the next visit. 

## 2022-08-31 ENCOUNTER — Ambulatory Visit
Admission: EM | Admit: 2022-08-31 | Discharge: 2022-08-31 | Disposition: A | Discharge status: Home / Self Care | Payer: Medicare PPO | Attending: Family Medicine | Admitting: Family Medicine

## 2022-08-31 DIAGNOSIS — J0100 Acute maxillary sinusitis, unspecified: Secondary | ICD-10-CM | POA: Diagnosis not present

## 2022-08-31 DIAGNOSIS — R051 Acute cough: Secondary | ICD-10-CM

## 2022-08-31 MED ORDER — PREDNISONE 20 MG PO TABS: 40.0000 mg | ORAL_TABLET | Freq: Every day | ORAL | 0 refills | Status: DC

## 2022-08-31 MED ORDER — AMOXICILLIN-POT CLAVULANATE 875-125 MG PO TABS: 1.0000 | ORAL_TABLET | Freq: Two times a day (BID) | ORAL | 0 refills | Status: DC

## 2022-08-31 MED ORDER — PROMETHAZINE-DM 6.25-15 MG/5ML PO SYRP: 5.0000 mL | ORAL_SOLUTION | Freq: Four times a day (QID) | ORAL | 0 refills | Status: DC | PRN

## 2022-08-31 MED ORDER — ALBUTEROL SULFATE HFA 108 (90 BASE) MCG/ACT IN AERS: 2.0000 | INHALATION_SPRAY | RESPIRATORY_TRACT | 0 refills | Status: AC | PRN

## 2022-08-31 NOTE — ED Triage Notes (Signed)
Pt reports a pins and needles sensation all over her body, coughing, and body aches x 2 weeks.   Took theraflu

## 2022-09-01 NOTE — ED Provider Notes (Signed)
[facility]    CSN: [id number] Arrival date & time: 08/31/22  1649      History   Chief Complaint No chief complaint on file.   HPI Betty Dawson is a 67 y.o. female.   Patient presenting today with 1 to 2 weeks of coughing, body aches, tingling all over her body, fatigue, congestion.  Denies chest pain, shortness of breath, abdominal pain, nausea vomiting or diarrhea.  So far trying TheraFlu with minimal relief.  No known history of chronic pulmonary disease or sick contacts recently.    Past Medical History:  Diagnosis Date   Anemia    Arthritis    OA multiple joints   Heart murmur    Heel spur    Hypertension    Varicose veins     Patient Active Problem List   Diagnosis Date Noted   Encounter for general adult medical examination with abnormal findings 09/18/2020   Neck pain 09/18/2020   DDD (degenerative disc disease), lumbar 05/05/2019   Anemia, iron deficiency 10/30/2016   Complete tear of right rotator cuff    Subacromial impingement of right shoulder    Ankle pain, chronic 06/02/2011   HTN (hypertension) 05/20/2011   OA (osteoarthritis) 05/20/2011   Varicose veins of leg with edema, bilateral 05/20/2011    Past Surgical History:  Procedure Laterality Date   ABDOMINAL HYSTERECTOMY     endometriosis   BREAST BIOPSY Right 2017   x2   COLONOSCOPY  02/21/2013   [id number]:[id number] polyp measuring 6 mm in size was found in the distal/transverse colon; polypectomy was performed using snare cautery/The colon was redundant/The colon mucosa was otherwise normal/Normal mucosa in the terminal ileum   COLONOSCOPY N/A 02/21/2013   Procedure: COLONOSCOPY;  Surgeon: West Bali, MD;  Location: [facility];  Service: Endoscopy;  Laterality: N/A;  10:30 AM   FOOT SURGERY Right    heel spur   SHOULDER ARTHROSCOPY WITH ROTATOR CUFF REPAIR Right 01/30/2016   Procedure: SHOULDER ARTHROSCOPY WITH ACROMIOPLASTY;  Surgeon: Vickki Hearing, MD;  Location: [facility];   Service: Orthopedics;  Laterality: Right;  pt knows to arrive at 7:15   SHOULDER OPEN ROTATOR CUFF REPAIR Right 01/30/2016   Procedure: ROTATOR CUFF REPAIR SHOULDER OPEN;  Surgeon: Vickki Hearing, MD;  Location: [facility];  Service: Orthopedics;  Laterality: Right;    OB History   No obstetric history on file.      Home Medications    Prior to Admission medications   Medication Sig Start Date End Date Taking? Authorizing Provider  albuterol (VENTOLIN HFA) 108 (90 Base) MCG/ACT inhaler Inhale 2 puffs into the lungs every 4 (four) hours as needed for wheezing or shortness of breath. 08/31/22  Yes Particia Nearing, PA-C  amoxicillin-clavulanate (AUGMENTIN) 875-125 MG tablet Take 1 tablet by mouth every 12 (twelve) hours. 08/31/22  Yes Particia Nearing, PA-C  predniSONE (DELTASONE) 20 MG tablet Take 2 tablets (40 mg total) by mouth daily with breakfast. 08/31/22  Yes Particia Nearing, PA-C  promethazine-dextromethorphan (PROMETHAZINE-DM) 6.25-15 MG/5ML syrup Take 5 mLs by mouth 4 (four) times daily as needed. 08/31/22  Yes Particia Nearing, PA-C  acetaminophen (TYLENOL) 500 MG tablet Take 1,000 mg by mouth every 6 (six) hours as needed (pain).     [provider]  amLODipine (NORVASC) 10 MG tablet Take 1 tablet (10 mg total) by mouth daily. 04/08/22   Anabel Halon, MD  cyclobenzaprine (FLEXERIL) 10 MG tablet TAKE 1 TABLET BY  MOUTH TWICE A DAY AS NEEDED FOR MUSCLE SPASM 04/07/21   Anabel Halon, MD  Ferrous Sulfate Dried 143 (45 Fe) MG TBCR Take 1 tablet by mouth daily. 05/07/17   [place], Velna Hatchet, MD  gabapentin (NEURONTIN) 300 MG capsule Take 1 capsule (300 mg total) by mouth at bedtime. 09/18/20   Anabel Halon, MD  hydrochlorothiazide (HYDRODIURIL) 25 MG tablet TAKE 1 TABLET (25 MG TOTAL) BY MOUTH DAILY. 03/31/22   Anabel Halon, MD  ibuprofen (ADVIL,MOTRIN) 800 MG tablet Take 1 tablet (800 mg total) by mouth every 8 (eight) hours as needed. 03/23/16   Vickki Hearing, MD    Family History Family History  Problem Relation Age of Onset   Hypertension Mother    Cancer Mother    Diabetes Sister    Hyperlipidemia Sister    Hypertension Sister    Diabetes Brother    Hypertension Brother     Social History Social History   Tobacco Use   Smoking status: Never   Smokeless tobacco: Never  Substance Use Topics   Alcohol use: No   Drug use: No     Allergies   Lisinopril   Review of Systems Review of Systems Per HPI  Physical Exam Triage Vital Signs ED Triage Vitals [08/31/22 1812]  Encounter Vitals Group     BP 120/74     Systolic BP Percentile      Diastolic BP Percentile      Pulse Rate 75     Resp 16     Temp 99.2 F (37.3 C)     Temp Source Oral     SpO2 100 %     Weight      Height      Head Circumference      Peak Flow      Pain Score 4     Pain Loc      Pain Education      Exclude from Growth Chart    No data found.  Updated Vital Signs BP 120/74 (BP Location: Right Arm)   Pulse 75   Temp 99.2 F (37.3 C) (Oral)   Resp 16   SpO2 100%   Visual Acuity Right Eye Distance:   Left Eye Distance:   Bilateral Distance:    Right Eye Near:   Left Eye Near:    Bilateral Near:     Physical Exam Vitals and nursing note reviewed.  Constitutional:      Appearance: Normal appearance.  HENT:     Head: Atraumatic.     Right Ear: Tympanic membrane and external ear normal.     Left Ear: Tympanic membrane and external ear normal.     Nose: Congestion present.     Mouth/Throat:     Mouth: Mucous membranes are moist.     Pharynx: Posterior oropharyngeal erythema present.  Eyes:     Extraocular Movements: Extraocular movements intact.     Conjunctiva/sclera: Conjunctivae normal.  Cardiovascular:     Rate and Rhythm: Normal rate and regular rhythm.     Heart sounds: Normal heart sounds.  Pulmonary:     Effort: Pulmonary effort is normal.     Breath sounds: Wheezing present.  Musculoskeletal:         General: Normal range of motion.     Cervical back: Normal range of motion and neck supple.  Skin:    General: Skin is warm and dry.  Neurological:     Mental Status: She is  alert and oriented to person, place, and time.  Psychiatric:        Mood and Affect: Mood normal.        Thought Content: Thought content normal.      UC Treatments / Results  Labs (all labs ordered are listed, but only abnormal results are displayed) Labs Reviewed - No data to display  EKG   Radiology No results found.  Procedures Procedures (including critical care time)  Medications Ordered in UC Medications - No data to display  Initial Impression / Assessment and Plan / UC Course  I have reviewed the triage vital signs and the nursing notes.  Pertinent labs & imaging results that were available during my care of the patient were reviewed by me and considered in my medical decision making (see chart for details).     Duration, worsening course treat for sinusitis and cough with wheezing with prednisone, Augmentin, Phenergan DM, albuterol inhaler.  Discussed supportive over-the-counter medications and home care additionally.  Return for worsening symptoms.  Final Clinical Impressions(s) / UC Diagnoses   Final diagnoses:  Acute non-recurrent maxillary sinusitis  Acute cough   Discharge Instructions   None    ED Prescriptions     Medication Sig Dispense Auth. Provider   predniSONE (DELTASONE) 20 MG tablet Take 2 tablets (40 mg total) by mouth daily with breakfast. 10 tablet Particia Nearing, PA-C   amoxicillin-clavulanate (AUGMENTIN) 875-125 MG tablet Take 1 tablet by mouth every 12 (twelve) hours. 14 tablet Particia Nearing, [place]   promethazine-dextromethorphan (PROMETHAZINE-DM) 6.25-15 MG/5ML syrup Take 5 mLs by mouth 4 (four) times daily as needed. 100 mL Particia Nearing, PA-C   albuterol (VENTOLIN HFA) 108 (90 Base) MCG/ACT inhaler Inhale 2 puffs into the lungs every  4 (four) hours as needed for wheezing or shortness of breath. 18 g Particia Nearing, [place]      PDMP not reviewed this encounter.   Particia Nearing, [place] 09/01/22 1800

## 2022-09-27 ENCOUNTER — Other Ambulatory Visit: Payer: Self-pay | Admitting: Internal Medicine

## 2022-09-27 DIAGNOSIS — I10 Essential (primary) hypertension: Secondary | ICD-10-CM

## 2022-10-09 DIAGNOSIS — E559 Vitamin D deficiency, unspecified: Secondary | ICD-10-CM | POA: Diagnosis not present

## 2022-10-09 DIAGNOSIS — D508 Other iron deficiency anemias: Secondary | ICD-10-CM | POA: Diagnosis not present

## 2022-10-09 DIAGNOSIS — R739 Hyperglycemia, unspecified: Secondary | ICD-10-CM | POA: Diagnosis not present

## 2022-10-09 DIAGNOSIS — E782 Mixed hyperlipidemia: Secondary | ICD-10-CM | POA: Diagnosis not present

## 2022-10-09 DIAGNOSIS — I10 Essential (primary) hypertension: Secondary | ICD-10-CM | POA: Diagnosis not present

## 2022-10-14 ENCOUNTER — Encounter: Payer: Self-pay | Admitting: Internal Medicine

## 2022-10-14 ENCOUNTER — Ambulatory Visit (INDEPENDENT_AMBULATORY_CARE_PROVIDER_SITE_OTHER): Payer: Medicare PPO | Admitting: Internal Medicine

## 2022-10-14 VITALS — BP 134/72 | HR 70 | Ht 68.0 in | Wt 184.2 lb

## 2022-10-14 DIAGNOSIS — Z0001 Encounter for general adult medical examination with abnormal findings: Secondary | ICD-10-CM

## 2022-10-14 DIAGNOSIS — I10 Essential (primary) hypertension: Secondary | ICD-10-CM | POA: Diagnosis not present

## 2022-10-14 DIAGNOSIS — M5136 Other intervertebral disc degeneration, lumbar region: Secondary | ICD-10-CM | POA: Diagnosis not present

## 2022-10-14 DIAGNOSIS — M159 Polyosteoarthritis, unspecified: Secondary | ICD-10-CM

## 2022-10-14 DIAGNOSIS — E876 Hypokalemia: Secondary | ICD-10-CM | POA: Diagnosis not present

## 2022-10-14 DIAGNOSIS — M51369 Other intervertebral disc degeneration, lumbar region without mention of lumbar back pain or lower extremity pain: Secondary | ICD-10-CM

## 2022-10-14 DIAGNOSIS — Z23 Encounter for immunization: Secondary | ICD-10-CM | POA: Diagnosis not present

## 2022-10-14 DIAGNOSIS — M150 Primary generalized (osteo)arthritis: Secondary | ICD-10-CM

## 2022-10-14 MED ORDER — AMLODIPINE BESYLATE 10 MG PO TABS: 10.0000 mg | ORAL_TABLET | Freq: Every day | ORAL | 1 refills | Status: DC

## 2022-10-14 NOTE — Assessment & Plan Note (Signed)
Likely due to hydrochlorothiazide, usually remains wnl Advised to increase potassium rich food intake Check repeat BMP before next visit

## 2022-10-14 NOTE — Assessment & Plan Note (Signed)
Knee and ankles involvement Tylenol PRN

## 2022-10-14 NOTE — Progress Notes (Signed)
Established Patient Office Visit  Subjective:  Patient ID: JAKOBI MAINWARING, female    DOB: Dec 03, 1955  Age: 67 y.o. MRN: [id number]  CC:  Chief Complaint  Patient presents with   Annual Exam    HPI Betty Dawson is a 68 y.o. female with past medical history of HTN, OA, DDD of lumbar spine and iron deficiency anemia who presents for annual physical.  HTN: BP is well-controlled. Takes amlodipine regularly.  She is supposed to take amlodipine 10 mg QD, but has been getting 5 mg from pharmacy, which she has to take 2 tablets. Patient denies headache, dizziness, chest pain, dyspnea or palpitations.  Chronic low back pain: She has chronic, intermittent low back pain, radiating to b/l LE with numbness of left foot, worse with movement and walking and better with rest.  She has done physical therapy for low back pain and LE weakness, which has slowly improved her symptoms. She has seen spine surgery for DDD of lumbar spine and was advised PT for now.  She denies any recent fall or injury.  Blood tests were reviewed and discussed with the patient in detail.    Past Medical History:  Diagnosis Date   Anemia    Arthritis    OA multiple joints   Heart murmur    Heel spur    Hypertension    Varicose veins     Past Surgical History:  Procedure Laterality Date   ABDOMINAL HYSTERECTOMY     endometriosis   BREAST BIOPSY Right 2017   x2   COLONOSCOPY  02/21/2013   WUJ:WJXBJY polyp measuring 6 mm in size was found in the distal/transverse colon; polypectomy was performed using snare cautery/The colon was redundant/The colon mucosa was otherwise normal/Normal mucosa in the terminal ileum   COLONOSCOPY N/A 02/21/2013   Procedure: COLONOSCOPY;  Surgeon: West Bali, MD;  Location: [facility];  Service: Endoscopy;  Laterality: N/A;  10:30 AM   FOOT SURGERY Right    heel spur   SHOULDER ARTHROSCOPY WITH ROTATOR CUFF REPAIR Right 01/30/2016   Procedure: SHOULDER ARTHROSCOPY WITH  ACROMIOPLASTY;  Surgeon: Vickki Hearing, MD;  Location: [facility];  Service: Orthopedics;  Laterality: Right;  pt knows to arrive at 7:15   SHOULDER OPEN ROTATOR CUFF REPAIR Right 01/30/2016   Procedure: ROTATOR CUFF REPAIR SHOULDER OPEN;  Surgeon: Vickki Hearing, MD;  Location: [facility];  Service: Orthopedics;  Laterality: Right;    Family History  Problem Relation Age of Onset   Hypertension Mother    Cancer Mother    Diabetes Sister    Hyperlipidemia Sister    Hypertension Sister    Diabetes Brother    Hypertension Brother     Social History   Socioeconomic History   Marital status: Single    Spouse name: Not on file   Number of children: Not on file   Years of education: Not on file   Highest education level: Not on file  Occupational History   Not on file  Tobacco Use   Smoking status: Never   Smokeless tobacco: Never  Substance and Sexual Activity   Alcohol use: No   Drug use: No   Sexual activity: Not Currently    Birth control/protection: Surgical  Other Topics Concern   Not on file  Social History Narrative   Not on file   Social Determinants of Health   Financial Resource Strain: Low Risk  (10/06/2021)   Overall Financial Resource Strain (CARDIA)  Difficulty of Paying Living Expenses: Not very hard  Food Insecurity: Food Insecurity Present (07/01/2021)   Hunger Vital Sign    Worried About Running Out of Food in the Last Year: Never true    Ran Out of Food in the Last Year: Sometimes true  Transportation Needs: No Transportation Needs (10/06/2021)   PRAPARE - Administrator, Civil Service (Medical): No    Lack of Transportation (Non-Medical): No  Physical Activity: Insufficiently Active (10/06/2021)   Exercise Vital Sign    Days of Exercise per Week: 2 days    Minutes of Exercise per Session: 10 min  Stress: No Stress Concern Present (09/29/2020)   [employer] of Occupational Health - Occupational Stress Questionnaire    Feeling of  Stress : Not at all  Social Connections: Moderately Isolated (10/06/2021)   Social Connection and Isolation Panel [NHANES]    Frequency of Communication with Friends and Family: Three times a week    Frequency of Social Gatherings with Friends and Family: Three times a week    Attends Religious Services: 1 to 4 times per year    Active Member of Clubs or Organizations: No    Attends Banker Meetings: Never    Marital Status: Widowed  Intimate Partner Violence: Not At Risk (09/29/2020)   Humiliation, Afraid, Rape, and Kick questionnaire    Fear of Current or Ex-Partner: No    Emotionally Abused: No    Physically Abused: No    Sexually Abused: No    Outpatient Medications Prior to Visit  Medication Sig Dispense Refill   acetaminophen (TYLENOL) 500 MG tablet Take 1,000 mg by mouth every 6 (six) hours as needed (pain).      albuterol (VENTOLIN HFA) 108 (90 Base) MCG/ACT inhaler Inhale 2 puffs into the lungs every 4 (four) hours as needed for wheezing or shortness of breath. 18 g 0   cyclobenzaprine (FLEXERIL) 10 MG tablet TAKE 1 TABLET BY MOUTH TWICE A DAY AS NEEDED FOR MUSCLE SPASM 30 tablet 1   Ferrous Sulfate Dried 143 (45 Fe) MG TBCR Take 1 tablet by mouth daily. 30 tablet 3   gabapentin (NEURONTIN) 300 MG capsule Take 1 capsule (300 mg total) by mouth at bedtime. 30 capsule 5   hydrochlorothiazide (HYDRODIURIL) 25 MG tablet TAKE 1 TABLET (25 MG TOTAL) BY MOUTH DAILY. 90 tablet 1   ibuprofen (ADVIL,MOTRIN) 800 MG tablet Take 1 tablet (800 mg total) by mouth every 8 (eight) hours as needed. 90 tablet 5   promethazine-dextromethorphan (PROMETHAZINE-DM) 6.25-15 MG/5ML syrup Take 5 mLs by mouth 4 (four) times daily as needed. 100 mL 0   amLODipine (NORVASC) 10 MG tablet Take 1 tablet (10 mg total) by mouth daily. 90 tablet 1   amLODipine (NORVASC) 5 MG tablet TAKE 1 TABLET (5 MG TOTAL) BY MOUTH DAILY. 90 tablet 1   amoxicillin-clavulanate (AUGMENTIN) 875-125 MG tablet Take 1 tablet  by mouth every 12 (twelve) hours. 14 tablet 0   predniSONE (DELTASONE) 20 MG tablet Take 2 tablets (40 mg total) by mouth daily with breakfast. 10 tablet 0   No facility-administered medications prior to visit.    Allergies  Allergen Reactions   Lisinopril Swelling    ROS Review of Systems  Constitutional:  Negative for chills and fever.  HENT:  Negative for congestion, sinus pressure, sinus pain and sore throat.   Eyes:  Negative for pain and discharge.  Respiratory:  Negative for cough and shortness of breath.   Cardiovascular:  Negative for chest pain and palpitations.  Gastrointestinal:  Negative for abdominal pain, constipation, diarrhea, nausea and vomiting.  Endocrine: Negative for polydipsia and polyuria.  Genitourinary:  Negative for dysuria and hematuria.  Musculoskeletal:  Positive for arthralgias and back pain. Negative for neck pain and neck stiffness.  Skin:  Negative for rash.  Neurological:  Negative for dizziness and weakness.  Psychiatric/Behavioral:  Negative for agitation and behavioral problems.       Objective:    Physical Exam Vitals reviewed.  Constitutional:      General: She is not in acute distress.    Appearance: She is not diaphoretic.  HENT:     Head: Normocephalic and atraumatic.     Nose: Nose normal.     Mouth/Throat:     Mouth: Mucous membranes are moist.  Eyes:     General: No scleral icterus.    Extraocular Movements: Extraocular movements intact.  Neck:     Vascular: No carotid bruit.  Cardiovascular:     Rate and Rhythm: Normal rate and regular rhythm.     Pulses: Normal pulses.     Heart sounds: Normal heart sounds. No murmur heard. Pulmonary:     Breath sounds: Normal breath sounds. No wheezing or rales.  Abdominal:     Palpations: Abdomen is soft.     Tenderness: There is no abdominal tenderness.  Musculoskeletal:     Cervical back: Neck supple. No tenderness.     Right lower leg: No edema.     Left lower leg: No edema.   Skin:    General: Skin is warm.     Findings: No rash.  Neurological:     General: No focal deficit present.     Mental Status: She is alert and oriented to person, place, and time.     Cranial Nerves: No cranial nerve deficit.     Sensory: No sensory deficit.     Motor: No weakness.  Psychiatric:        Mood and Affect: Mood normal.        Behavior: Behavior normal.     BP 134/72 (BP Location: Right Arm)   Pulse 70   Ht 5\' 8"  (1.727 m)   Wt 184 lb 3.2 oz (83.6 kg)   SpO2 96%   BMI 28.01 kg/m  Wt Readings from Last 3 Encounters:  10/14/22 184 lb 3.2 oz (83.6 kg)  06/10/22 191 lb 6.4 oz (86.8 kg)  04/08/22 192 lb (87.1 kg)    Lab Results  Component Value Date   TSH 0.783 10/09/2022   Lab Results  Component Value Date   WBC 4.8 10/09/2022   HGB 12.1 10/09/2022   HCT 37.3 10/09/2022   MCV 86 10/09/2022   PLT 318 10/09/2022   Lab Results  Component Value Date   NA 142 10/09/2022   K 3.3 (L) 10/09/2022   CO2 31 (H) 10/09/2022   GLUCOSE 99 10/09/2022   BUN 15 10/09/2022   CREATININE 0.58 10/09/2022   BILITOT 0.3 10/09/2022   ALKPHOS 107 10/09/2022   AST 15 10/09/2022   ALT 8 10/09/2022   PROT 7.4 10/09/2022   ALBUMIN 4.2 10/09/2022   CALCIUM 9.7 10/09/2022   ANIONGAP 7 01/28/2016   EGFR 99 10/09/2022   Lab Results  Component Value Date   CHOL 147 10/09/2022   Lab Results  Component Value Date   HDL 63 10/09/2022   Lab Results  Component Value Date   LDLCALC 73 10/09/2022   Lab Results  Component Value Date   TRIG 51 10/09/2022   Lab Results  Component Value Date   CHOLHDL 2.3 10/09/2022   Lab Results  Component Value Date   HGBA1C 5.7 (H) 10/09/2022      Assessment & Plan:   Problem List Items Addressed This Visit       Cardiovascular and Mediastinum   HTN (hypertension)    BP Readings from Last 1 Encounters:  10/14/22 134/72   Well-controlled with Amlodipine 10 mg QD and HCTZ 25 mg QD Counseled for compliance with the  medications Advised DASH diet and moderate exercise/walking, at least 150 mins/week      Relevant Medications   amLODipine (NORVASC) 10 MG tablet     Musculoskeletal and Integument   OA (osteoarthritis)    Knee and ankles involvement Tylenol PRN      DDD (degenerative disc disease), lumbar    Chronic low back pain radiating to left LE On Gabapentin and PRN Tylenol, but does not take it currently Flexeril 10 mg PRN for muscle spasms MRI lumbar spine reviewed Has seen spine surgeon Has done physical therapy        Other   Encounter for general adult medical examination with abnormal findings - Primary    Physical exam as documented. Counseling done  re healthy lifestyle involving commitment to 150 minutes exercise per week, heart healthy diet, and attaining healthy weight.The importance of adequate sleep also discussed. Changes in health habits are decided on by the patient with goals and time frames  set for achieving them. Immunization and cancer screening needs are specifically addressed at this visit.      Hypokalemia    Likely due to hydrochlorothiazide, usually remains wnl Advised to increase potassium rich food intake Check repeat BMP before next visit      Relevant Orders   Basic Metabolic Panel (BMET)   Other Visit Diagnoses     Encounter for immunization       Relevant Orders   Flu Vaccine Trivalent High Dose (Fluad) (Completed)       Meds ordered this encounter  Medications   amLODipine (NORVASC) 10 MG tablet    Sig: Take 1 tablet (10 mg total) by mouth daily. DISCONTINUE AMLODIPINE 5 MG PRESCRIPTION.    Dispense:  90 tablet    Refill:  1    Follow-up: Return in about 6 months (around 04/13/2023) for HTN.    Anabel Halon, MD

## 2022-10-14 NOTE — Assessment & Plan Note (Signed)

## 2022-10-14 NOTE — Patient Instructions (Addendum)
Schedule your Medicare Annual Wellness Visit at checkout.  Please start taking Amlodipine 10 mg once daily instead of 5 mg. Okay to take 2 tablets of 5 mg until you complete current supplies.  Please continue to take other medications as prescribed.  Please continue to follow low salt diet and perform moderate exercise/walking at least 150 mins/week.

## 2022-10-14 NOTE — Assessment & Plan Note (Signed)
Chronic low back pain radiating to left LE On Gabapentin and PRN Tylenol, but does not take it currently Flexeril 10 mg PRN for muscle spasms MRI lumbar spine reviewed Has seen spine surgeon Has done physical therapy

## 2022-10-14 NOTE — Assessment & Plan Note (Addendum)
BP Readings from Last 1 Encounters:  10/14/22 134/72   Well-controlled with Amlodipine 10 mg QD and HCTZ 25 mg QD Counseled for compliance with the medications Advised DASH diet and moderate exercise/walking, at least 150 mins/week

## 2022-11-02 ENCOUNTER — Ambulatory Visit: Payer: Medicare PPO

## 2022-11-02 VITALS — Ht 68.0 in | Wt 184.0 lb

## 2022-11-02 DIAGNOSIS — Z0000 Encounter for general adult medical examination without abnormal findings: Secondary | ICD-10-CM | POA: Diagnosis not present

## 2022-11-02 DIAGNOSIS — Z1231 Encounter for screening mammogram for malignant neoplasm of breast: Secondary | ICD-10-CM

## 2022-11-02 NOTE — Progress Notes (Signed)
Subjective:   Betty Dawson is a 67 y.o. female who presents for Medicare Annual (Subsequent) preventive examination.  Visit Complete: Virtual  I connected with  Betty Dawson on 11/02/22 by a audio enabled telemedicine application and verified that I am speaking with the correct person using two identifiers.  Patient Location: Home  Provider Location: Home Office  I discussed the limitations of evaluation and management by telemedicine. The patient expressed understanding and agreed to proceed.  Patient Medicare AWV questionnaire was completed by the patient on 11/02/2022; I have confirmed that all information answered by patient is correct and no changes since this date.  Cardiac Risk Factors include: advanced age (>17men, >8 women);dyslipidemia;hypertensionBecause this visit was a virtual/telehealth visit, some criteria may be missing or patient reported. Any vitals not documented were not able to be obtained and vitals that have been documented are patient reported.       Objective:    Today's Vitals   11/02/22 0901  Weight: 184 lb (83.5 kg)  Height: 5\' 8"  (1.727 m)   Body mass index is 27.98 kg/m.     11/02/2022    9:04 AM 07/01/2021   10:16 AM 04/16/2021   10:41 AM 09/29/2020    9:49 AM 04/06/2019   11:17 AM 03/20/2016    9:13 AM 01/30/2016    7:29 AM  Advanced Directives  Does Patient Have a Medical Advance Directive? No No No No No No No  Would patient like information on creating a medical advance directive? Yes (MAU/Ambulatory/Procedural Areas - Information given) No - Patient declined No - Patient declined No - Patient declined No - Patient declined No - Patient declined     Current Medications (verified) Outpatient Encounter Medications as of 11/02/2022  Medication Sig   acetaminophen (TYLENOL) 500 MG tablet Take 1,000 mg by mouth every 6 (six) hours as needed (pain).    albuterol (VENTOLIN HFA) 108 (90 Base) MCG/ACT inhaler Inhale 2 puffs into the lungs every 4  (four) hours as needed for wheezing or shortness of breath.   amLODipine (NORVASC) 10 MG tablet Take 1 tablet (10 mg total) by mouth daily. DISCONTINUE AMLODIPINE 5 MG PRESCRIPTION.   cyclobenzaprine (FLEXERIL) 10 MG tablet TAKE 1 TABLET BY MOUTH TWICE A DAY AS NEEDED FOR MUSCLE SPASM   Ferrous Sulfate Dried 143 (45 Fe) MG TBCR Take 1 tablet by mouth daily.   gabapentin (NEURONTIN) 300 MG capsule Take 1 capsule (300 mg total) by mouth at bedtime.   hydrochlorothiazide (HYDRODIURIL) 25 MG tablet TAKE 1 TABLET (25 MG TOTAL) BY MOUTH DAILY.   ibuprofen (ADVIL,MOTRIN) 800 MG tablet Take 1 tablet (800 mg total) by mouth every 8 (eight) hours as needed.   promethazine-dextromethorphan (PROMETHAZINE-DM) 6.25-15 MG/5ML syrup Take 5 mLs by mouth 4 (four) times daily as needed.   No facility-administered encounter medications on file as of 11/02/2022.    Allergies (verified) Lisinopril   History: Past Medical History:  Diagnosis Date   Anemia    Arthritis    OA multiple joints   Heart murmur    Heel spur    Hypertension    Varicose veins    Past Surgical History:  Procedure Laterality Date   ABDOMINAL HYSTERECTOMY     endometriosis   BREAST BIOPSY Right 2017   x2   COLONOSCOPY  02/21/2013   WUJ:WJXBJY polyp measuring 6 mm in size was found in the distal/transverse colon; polypectomy was performed using snare cautery/The colon was redundant/The colon mucosa was otherwise normal/Normal  mucosa in the terminal ileum   COLONOSCOPY N/A 02/21/2013   Procedure: COLONOSCOPY;  Surgeon: West Bali, MD;  Location: [facility];  Service: Endoscopy;  Laterality: N/A;  10:30 AM   FOOT SURGERY Right    heel spur   SHOULDER ARTHROSCOPY WITH ROTATOR CUFF REPAIR Right 01/30/2016   Procedure: SHOULDER ARTHROSCOPY WITH ACROMIOPLASTY;  Surgeon: Vickki Hearing, MD;  Location: [facility];  Service: Orthopedics;  Laterality: Right;  pt knows to arrive at 7:15   SHOULDER OPEN ROTATOR CUFF REPAIR Right 01/30/2016    Procedure: ROTATOR CUFF REPAIR SHOULDER OPEN;  Surgeon: Vickki Hearing, MD;  Location: [facility];  Service: Orthopedics;  Laterality: Right;   Family History  Problem Relation Age of Onset   Hypertension Mother    Cancer Mother    Diabetes Sister    Hyperlipidemia Sister    Hypertension Sister    Diabetes Brother    Hypertension Brother    Social History   Socioeconomic History   Marital status: Single    Spouse name: Not on file   Number of children: Not on file   Years of education: Not on file   Highest education level: Not on file  Occupational History   Not on file  Tobacco Use   Smoking status: Never   Smokeless tobacco: Never  Substance and Sexual Activity   Alcohol use: No   Drug use: No   Sexual activity: Not Currently    Birth control/protection: Surgical  Other Topics Concern   Not on file  Social History Narrative   Not on file   Social Determinants of Health   Financial Resource Strain: Low Risk  (11/02/2022)   Overall Financial Resource Strain (CARDIA)    Difficulty of Paying Living Expenses: Not hard at all  Food Insecurity: No Food Insecurity (11/02/2022)   Hunger Vital Sign    Worried About Running Out of Food in the Last Year: Never true    Ran Out of Food in the Last Year: Never true  Transportation Needs: No Transportation Needs (11/02/2022)   PRAPARE - Administrator, Civil Service (Medical): No    Lack of Transportation (Non-Medical): No  Physical Activity: Insufficiently Active (11/02/2022)   Exercise Vital Sign    Days of Exercise per Week: 3 days    Minutes of Exercise per Session: 30 min  Stress: No Stress Concern Present (11/02/2022)   [employer] of Occupational Health - Occupational Stress Questionnaire    Feeling of Stress : Not at all  Social Connections: Socially Isolated (11/02/2022)   Social Connection and Isolation Panel [NHANES]    Frequency of Communication with Friends and Family: More than three times a  week    Frequency of Social Gatherings with Friends and Family: More than three times a week    Attends Religious Services: Never    Database administrator or Organizations: No    Attends Banker Meetings: Never    Marital Status: Widowed    Tobacco Counseling Counseling given: Not Answered   Clinical Intake:  Pre-visit preparation completed: Yes  Pain : No/denies pain     Nutritional Risks: None Diabetes: No  How often do you need to have someone help you when you read instructions, pamphlets, or other written materials from your doctor or pharmacy?: 1 - Never  Interpreter Needed?: No  Information entered by :: Betty Ora, LPN   Activities of Daily Living    11/02/2022  9:04 AM  In your present state of health, do you have any difficulty performing the following activities:  Hearing? 0  Vision? 0  Difficulty concentrating or making decisions? 0  Walking or climbing stairs? 0  Dressing or bathing? 0  Doing errands, shopping? 0  Preparing Food and eating ? N  Using the Toilet? N  In the past six months, have you accidently leaked urine? N  Do you have problems with loss of bowel control? N  Managing your Medications? N  Managing your Finances? N  Housekeeping or managing your Housekeeping? N    Patient Care Team: Anabel Halon, MD as PCP - General (Internal Medicine)  Indicate any recent Medical Services you may have received from other than [facility] providers in the past year (date may be approximate).     Assessment:   This is a routine wellness examination for Betty Dawson.  Hearing/Vision screen Vision Screening - Comments:: Wears rx glasses - up to date with routine eye exams with  [facility]    Goals Addressed             This Visit's Progress    Patient Stated   On track    Would like to drink more water       Depression Screen    11/02/2022    9:03 AM 10/14/2022   10:13 AM 06/10/2022    9:45 AM 04/08/2022   10:18 AM  10/07/2021   10:43 AM 07/01/2021   10:15 AM 05/23/2021    9:52 AM  PHQ 2/9 Scores  PHQ - 2 Score 0 0 0 0 0 0 0    Fall Risk    11/02/2022    9:02 AM 10/14/2022   10:13 AM 06/10/2022    9:45 AM 04/08/2022   10:18 AM 10/07/2021   10:43 AM  Fall Risk   Falls in the past year? 0 0 0 0 0  Number falls in past yr: 0 0 0 0 0  Injury with Fall? 0 0 0 0 0  Risk for fall due to : No Fall Risks    No Fall Risks  Follow up Falls prevention discussed    Falls evaluation completed    MEDICARE RISK AT HOME: Medicare Risk at Home Any stairs in or around the home?: No If so, are there any without handrails?: No Home free of loose throw rugs in walkways, pet beds, electrical cords, etc?: Yes Adequate lighting in your home to reduce risk of falls?: Yes Life alert?: No Use of a cane, walker or w/c?: No Grab bars in the bathroom?: No Shower chair or bench in shower?: No Elevated toilet seat or a handicapped toilet?: No  TIMED UP AND GO:  Was the test performed?  No    Cognitive Function:    09/29/2020    9:50 AM  MMSE - Mini Mental State Exam  Not completed: Unable to complete        11/02/2022    9:05 AM 10/06/2021    2:32 PM 09/29/2020    9:51 AM  6CIT Screen  What Year? 0 points 0 points 0 points  What month? 0 points 0 points 0 points  What time? 0 points 0 points 0 points  Count back from 20 0 points 0 points 0 points  Months in reverse 0 points 0 points 0 points  Repeat phrase 0 points 2 points 0 points  Total Score 0 points 2 points 0 points    Immunizations Immunization History  Administered Date(s) Administered   Fluad Quad(high Dose 65+) 10/18/2019, 10/01/2020, 10/07/2021   Fluad Trivalent(High Dose 65+) 10/14/2022   Influenza,inj,Quad PF,6+ Mos 11/11/2012, 10/22/2015, 10/30/2016, 12/22/2017, 11/28/2018   MODERNA COVID-19 SARS-COV-2 PEDS BIVALENT BOOSTER 60yr-63yr 10/12/2020   Moderna Sars-Covid-2 Vaccination 04/20/2019, 05/17/2019, 12/29/2019, 06/03/2020   Pneumococcal  Conjugate-13 04/24/2020   Pneumococcal Polysaccharide-23 05/23/2021   Tdap 03/28/2013   Zoster Recombinant(Shingrix) 04/24/2020, 09/18/2020   Zoster, Live 10/22/2015    TDAP status: Up to date  Flu Vaccine status: Up to date  Pneumococcal vaccine status: Up to date  Covid-19 vaccine status: Completed vaccines  Qualifies for Shingles Vaccine? Yes   Zostavax completed Yes   Shingrix Completed?: Yes  Screening Tests Health Maintenance  Topic Date Due   COVID-19 Vaccine (6 - 2023-24 season) 09/27/2022   MAMMOGRAM  10/21/2022   Colonoscopy  02/22/2023   DTaP/Tdap/Td (2 - Td or Tdap) 03/29/2023   Medicare Annual Wellness (AWV)  11/02/2023   Pneumonia Vaccine 35+ Years old  Completed   INFLUENZA VACCINE  Completed   DEXA SCAN  Completed   Hepatitis C Screening  Completed   Zoster Vaccines- Shingrix  Completed   HPV VACCINES  Aged Out    Health Maintenance  Health Maintenance Due  Topic Date Due   COVID-19 Vaccine (6 - 2023-24 season) 09/27/2022   MAMMOGRAM  10/21/2022   Colonoscopy  02/22/2023    Colorectal cancer screening: Type of screening: Colonoscopy. Completed 02/21/2013. Repeat every 10 years  Mammogram status: Ordered 11/02/2022. Pt provided with contact info and advised to call to schedule appt.   Bone Density status: Completed 05/03/2020. Results reflect: Bone density results: OSTEOPENIA. Repeat every 5 years.  Lung Cancer Screening: (Low Dose CT Chest recommended if Age 61-80 years, 20 pack-year currently smoking OR have quit w/in 15years.) does not qualify.   Lung Cancer Screening Referral: n/a  Additional Screening:  Hepatitis C Screening: does not qualify; Completed 10/22/2015  Vision Screening: Recommended annual ophthalmology exams for early detection of glaucoma and other disorders of the eye. Is the patient up to date with their annual eye exam?  Yes  Who is the provider or what is the name of the office in which the patient attends annual eye  exams? [facility]  If pt is not established with a provider, would they like to be referred to a provider to establish care? No .   Dental Screening: Recommended annual dental exams for proper oral hygiene  Community Resource Referral / Chronic Care Management: CRR required this visit?  No   CCM required this visit?  No     Plan:     I have personally reviewed and noted the following in the patient's chart:   Medical and social history Use of alcohol, tobacco or illicit drugs  Current medications and supplements including opioid prescriptions. Patient is not currently taking opioid prescriptions. Functional ability and status Nutritional status Physical activity Advanced directives List of other physicians Hospitalizations, surgeries, and ER visits in previous 12 months Vitals Screenings to include cognitive, depression, and falls Referrals and appointments  In addition, I have reviewed and discussed with patient certain preventive protocols, quality metrics, and best practice recommendations. A written personalized care plan for preventive services as well as general preventive health recommendations were provided to patient.     Lorrene Reid, LPN   16/01/958   After Visit Summary: (MyChart) Due to this being a telephonic visit, the after visit summary with patients personalized plan was offered to patient  via MyChart   Nurse Notes: none

## 2022-11-02 NOTE — Patient Instructions (Signed)
Betty Dawson , Thank you for taking time to come for your Medicare Wellness Visit. I appreciate your ongoing commitment to your health goals. Please review the following plan we discussed and let me know if I can assist you in the future.   Referrals/Orders/Follow-Ups/Clinician Recommendations: Aim for 30 minutes of exercise or brisk walking, 6-8 glasses of water, and 5 servings of fruits and vegetables each day.   This is a list of the screening recommended for you and due dates:  Health Maintenance  Topic Date Due   COVID-19 Vaccine (6 - 2023-24 season) 09/27/2022   Mammogram  10/21/2022   Colon Cancer Screening  02/22/2023   DTaP/Tdap/Td vaccine (2 - Td or Tdap) 03/29/2023   Medicare Annual Wellness Visit  11/02/2023   Pneumonia Vaccine  Completed   Flu Shot  Completed   DEXA scan (bone density measurement)  Completed   Hepatitis C Screening  Completed   Zoster (Shingles) Vaccine  Completed   HPV Vaccine  Aged Out    Advanced directives: (Provided) Advance directive discussed with you today. I have provided a copy for you to complete at home and have notarized. Once this is complete, please bring a copy in to our office so we can scan it into your chart. Information on Advanced Care Planning can be found at [facility] Advance Health Care Directives Advance Health Care Directives ([email or web address]    Next Medicare Annual Wellness Visit scheduled for next year: Yes  Insert Preventive Care attachment Insert FALL PREVENTION attachment if needed

## 2022-12-18 ENCOUNTER — Ambulatory Visit (HOSPITAL_COMMUNITY)
Admission: RE | Admit: 2022-12-18 | Discharge: 2022-12-18 | Disposition: A | Discharge status: Home / Self Care | Payer: Medicare PPO | Source: Ambulatory Visit | Attending: Internal Medicine

## 2022-12-18 ENCOUNTER — Encounter (HOSPITAL_COMMUNITY): Payer: Self-pay

## 2022-12-18 DIAGNOSIS — Z1231 Encounter for screening mammogram for malignant neoplasm of breast: Secondary | ICD-10-CM | POA: Diagnosis not present

## 2023-02-05 DIAGNOSIS — H524 Presbyopia: Secondary | ICD-10-CM | POA: Diagnosis not present

## 2023-02-24 IMAGING — MR MR LUMBAR SPINE W/O CM
5 series · 31 of 48 positions shown · non-contrast
Comparison: Lumbar spine radiographs 04/06/2019

CLINICAL DATA: Lumbar degenerative disc disease. Lumbar
radiculopathy. Chronic low back pain radiating to the lower
extremities with numbness in the left foot.

EXAM:
MRI LUMBAR SPINE WITHOUT CONTRAST
TECHNIQUE: Multiplanar, multisequence MR imaging of the lumbar spine was
performed. No intravenous contrast was administered.

[Series 5: T2 · sagittal · 4.0mm · 0.68mm/px · 7 of 15 slices shown (1 of 2)]
[im 1/15]
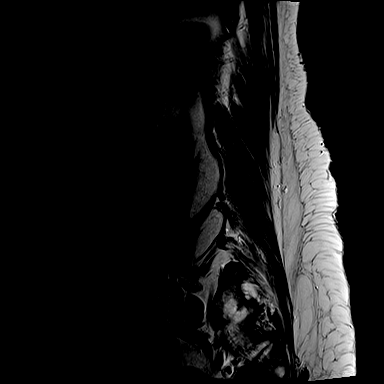
[im 3/15]
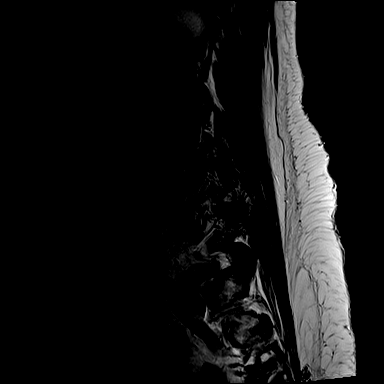
[im 5/15]
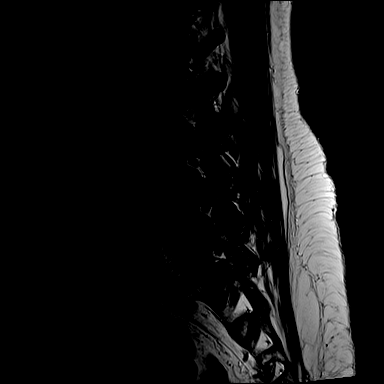
[im 8/15]
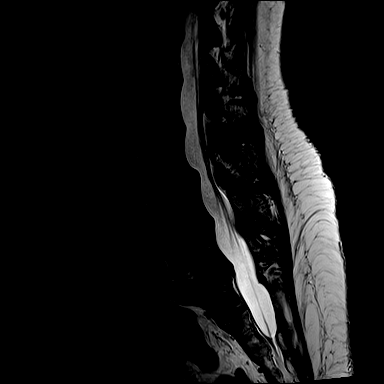
[im 10/15]
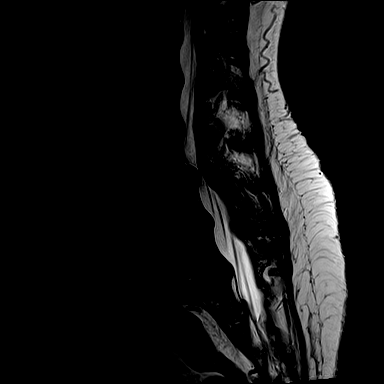
[im 12/15]
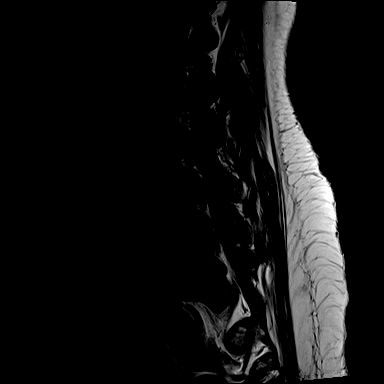
[im 15/15]
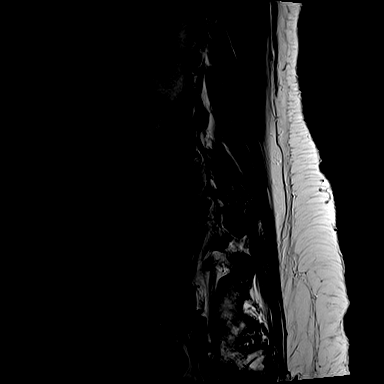

[Series 6: T1 · sagittal · 4.0mm · 0.81mm/px · 7 of 15 slices shown (1 of 2)]
[im 1/15]
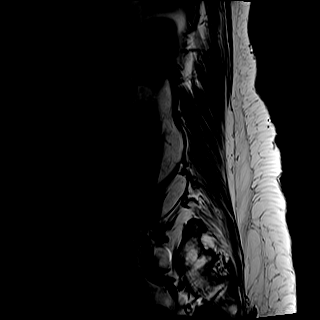
[im 3/15]
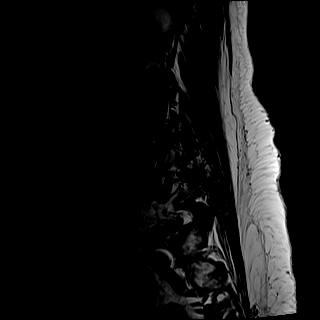
[im 5/15]
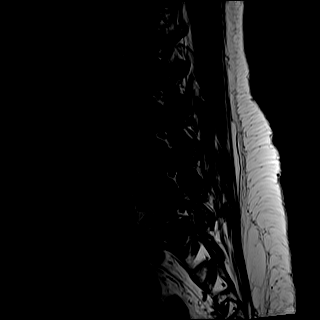
[im 8/15]
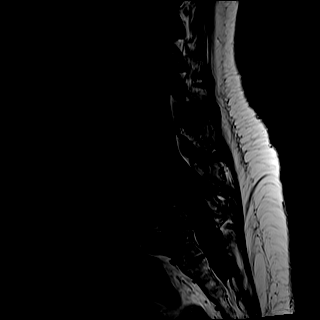
[im 10/15]
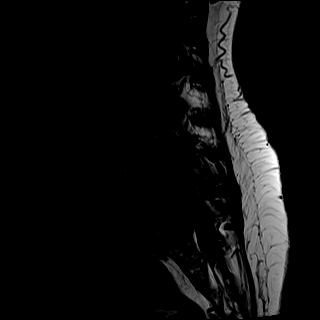
[im 12/15]
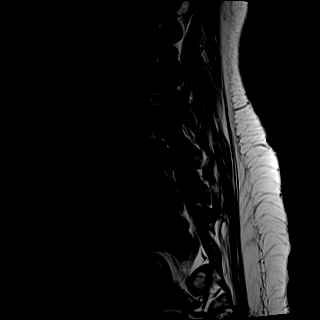
[im 15/15]
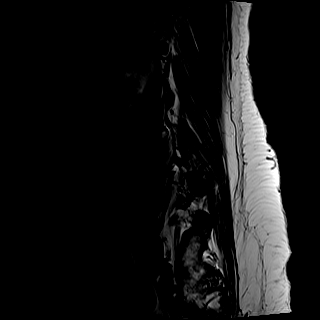

[Series 7: STIR · sagittal · 4.0mm · 0.51mm/px · 1 of 15 slices shown]
[im 1/15]
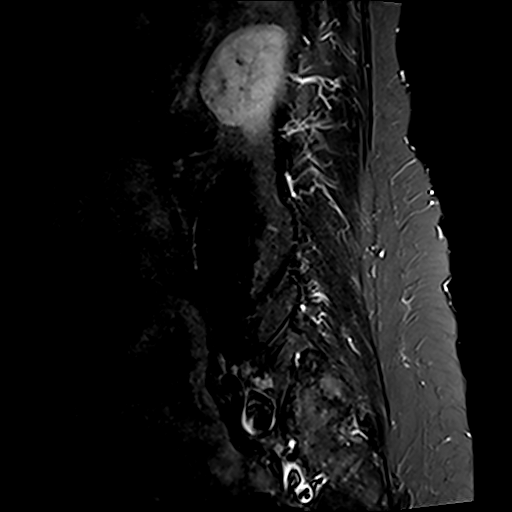

[Series 8: T2 · axial · 4.0mm · 0.70mm/px · z∈[-75,+115]mm · 8 of 33 slices shown (2 of 2)]
[im 1/33]
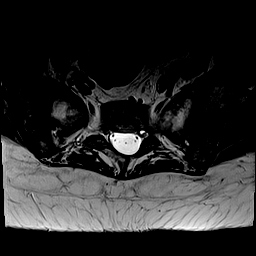
[im 5/33]
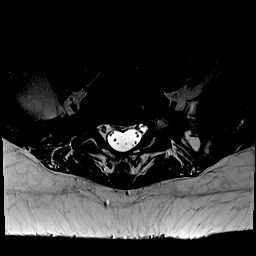
[im 10/33]
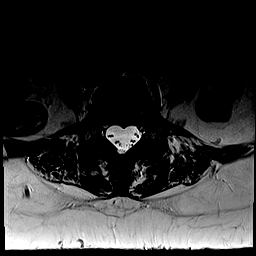
[im 15/33]
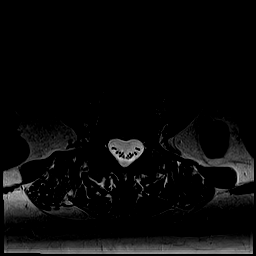
[im 18/33]
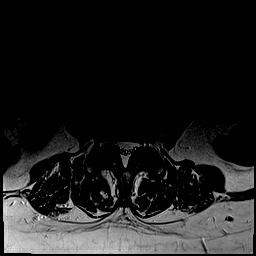
[im 23/33]
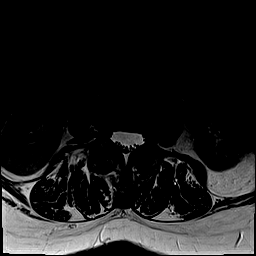
[im 28/33]
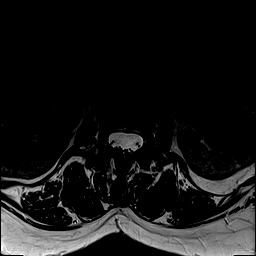
[im 33/33]
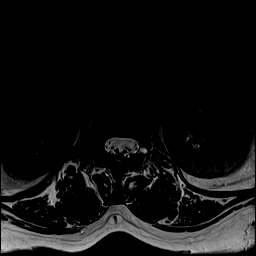

[Series 9: T1 · axial · 4.0mm · 0.35mm/px · z∈[-75,+115]mm · 8 of 33 slices shown (2 of 2)]
[im 1/33]
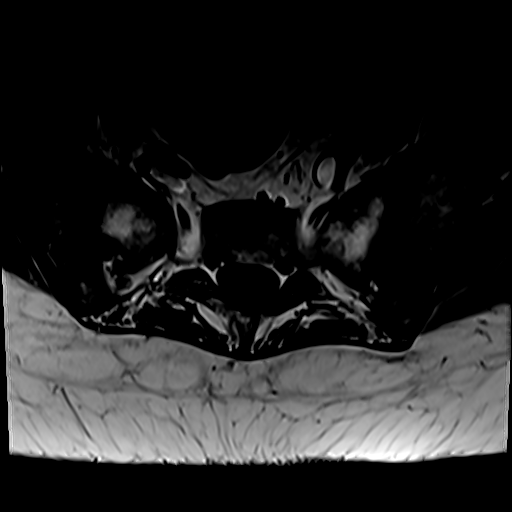
[im 5/33]
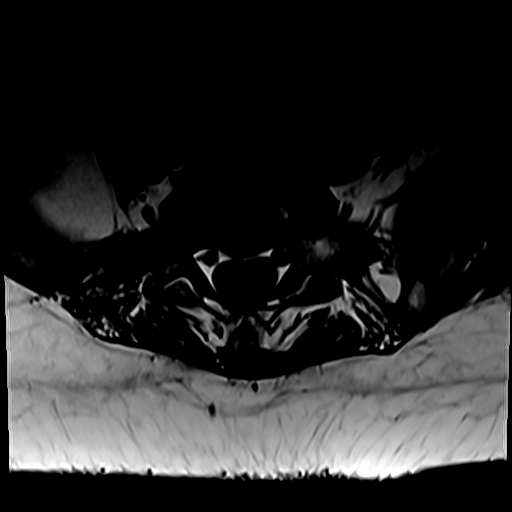
[im 10/33]
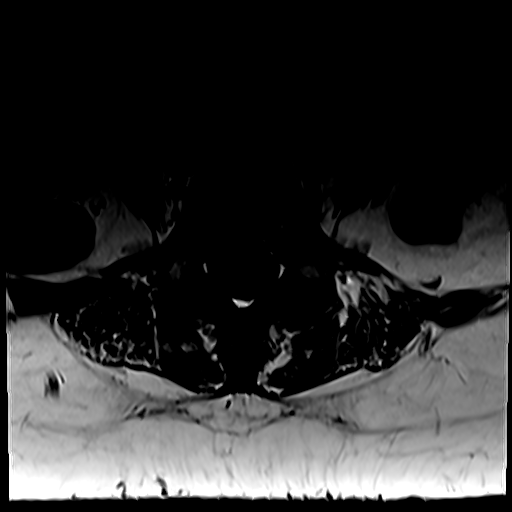
[im 15/33]
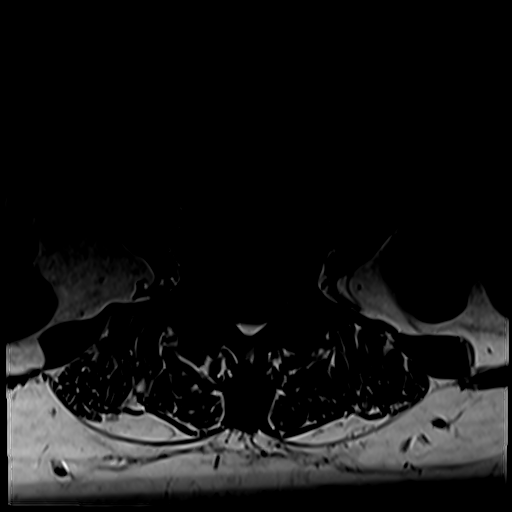
[im 18/33]
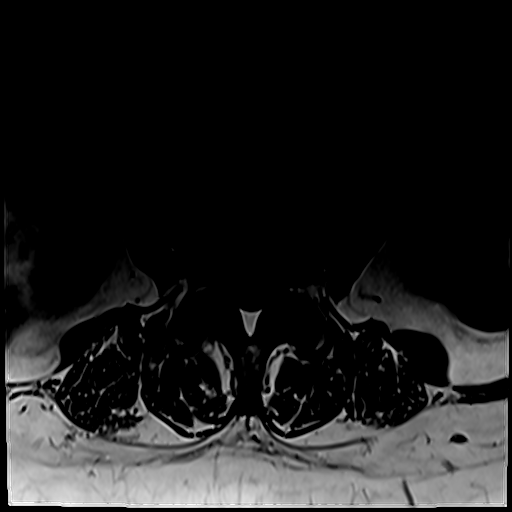
[im 23/33]
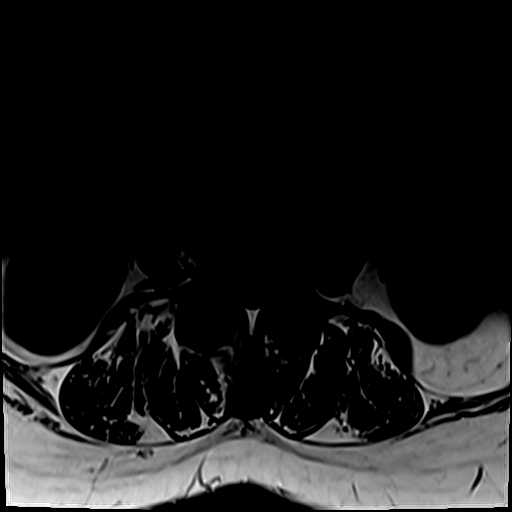
[im 28/33]
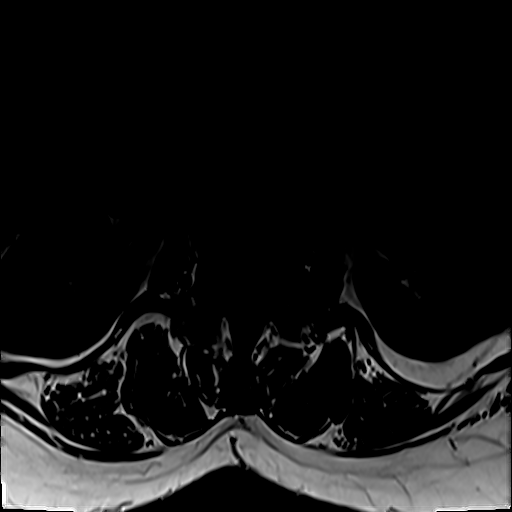
[im 33/33]
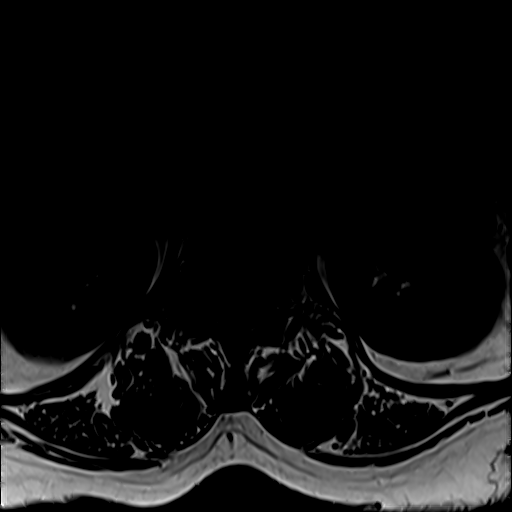

[31 of 48 positions shown; findings below may reference images not displayed]

FINDINGS: Segmentation:  Standard.

Alignment:  Unchanged grade 1 anterolisthesis of L3 on L4.

Vertebrae: Mild nonspecific diffuse bone marrow heterogeneity. No
suspicious focal marrow lesion fracture, or significant marrow
edema.

Conus medullaris and cauda equina: Conus extends to the L1-2 level.
Conus and cauda equina appear normal.

Paraspinal and other soft tissues: 1.1 cm T2 hyperintensity in the
left kidney compatible with a cyst.

Disc levels:

Moderate disc space narrowing at L3-4 and L5-S1 and mild narrowing
at L4-5 with disc desiccation at these levels.

L1-2: Mild disc bulging and moderate left greater than right facet
hypertrophy without stenosis.

L2-3: Mild disc bulging and moderate facet hypertrophy without
stenosis.

L3-4: Anterolisthesis with bulging uncovered disc, prominent dorsal
epidural fat, and severe facet and ligamentum flavum hypertrophy
result in mild spinal stenosis, mild right greater than left lateral
recess stenosis, and mild right greater than left neural foraminal
stenosis.

L4-5: Disc bulging and moderate facet and ligamentum flavum
hypertrophy result in mild bilateral lateral recess stenosis and
borderline to mild bilateral neural foraminal stenosis without
spinal stenosis.

L5-S1: Circumferential disc bulging eccentric to the left, endplate
spurring, and mild facet hypertrophy result in mild left neural
foraminal stenosis without spinal stenosis.
IMPRESSION: 1. Severe L3-4 facet arthrosis with grade 1 anterolisthesis and mild
spinal, lateral recess, and neural foraminal stenosis.
2. Mild lateral recess and neural foraminal stenosis at L4-5.
3. Mild left neural foraminal stenosis at L5-S1.

## 2023-04-07 DIAGNOSIS — E876 Hypokalemia: Secondary | ICD-10-CM | POA: Diagnosis not present

## 2023-04-08 LAB — BASIC METABOLIC PANEL
BUN/Creatinine Ratio: 31 — ABNORMAL HIGH (ref 12–28)
BUN: 17 mg/dL (ref 8–27)
CO2: 28 mmol/L (ref 20–29)
Calcium: 9.3 mg/dL (ref 8.7–10.3)
Chloride: 98 mmol/L (ref 96–106)
Creatinine, Ser: 0.55 mg/dL — ABNORMAL LOW (ref 0.57–1.00)
Glucose: 97 mg/dL (ref 70–99)
Potassium: 3.7 mmol/L (ref 3.5–5.2)
Sodium: 139 mmol/L (ref 134–144)
eGFR: 100 mL/min/{1.73_m2} (ref >59)

## 2023-04-13 ENCOUNTER — Encounter: Payer: Self-pay | Admitting: Internal Medicine

## 2023-04-13 ENCOUNTER — Ambulatory Visit (INDEPENDENT_AMBULATORY_CARE_PROVIDER_SITE_OTHER): Payer: Medicare PPO | Admitting: Internal Medicine

## 2023-04-13 VITALS — BP 114/72 | HR 69 | Ht 68.0 in | Wt 189.6 lb

## 2023-04-13 DIAGNOSIS — M51362 Other intervertebral disc degeneration, lumbar region with discogenic back pain and lower extremity pain: Secondary | ICD-10-CM | POA: Diagnosis not present

## 2023-04-13 DIAGNOSIS — M79674 Pain in right toe(s): Secondary | ICD-10-CM | POA: Diagnosis not present

## 2023-04-13 DIAGNOSIS — Z1211 Encounter for screening for malignant neoplasm of colon: Secondary | ICD-10-CM | POA: Diagnosis not present

## 2023-04-13 DIAGNOSIS — I10 Essential (primary) hypertension: Secondary | ICD-10-CM | POA: Diagnosis not present

## 2023-04-13 DIAGNOSIS — R7303 Prediabetes: Secondary | ICD-10-CM | POA: Insufficient documentation

## 2023-04-13 DIAGNOSIS — E559 Vitamin D deficiency, unspecified: Secondary | ICD-10-CM

## 2023-04-13 DIAGNOSIS — M79675 Pain in left toe(s): Secondary | ICD-10-CM

## 2023-04-13 DIAGNOSIS — E782 Mixed hyperlipidemia: Secondary | ICD-10-CM

## 2023-04-13 DIAGNOSIS — E876 Hypokalemia: Secondary | ICD-10-CM | POA: Diagnosis not present

## 2023-04-13 MED ORDER — HYDROCHLOROTHIAZIDE 25 MG PO TABS
25.0000 mg | ORAL_TABLET | Freq: Every day | ORAL | 1 refills | Status: DC
Start: 2023-04-13 — End: 2023-10-18

## 2023-04-13 MED ORDER — AMLODIPINE BESYLATE 10 MG PO TABS: 10.0000 mg | ORAL_TABLET | Freq: Every day | ORAL | 1 refills | Status: DC

## 2023-04-13 NOTE — Assessment & Plan Note (Signed)
 Chronic low back pain radiating to left LE On Gabapentin and PRN Tylenol, but does not take Gabapentin regularly Flexeril 10 mg PRN for muscle spasms MRI lumbar spine reviewed Has seen spine surgeon Has done physical therapy

## 2023-04-13 NOTE — Patient Instructions (Signed)
 Please continue to take medications as prescribed.  Please continue to follow low salt diet and perform moderate exercise/walking at least 150 mins/week.  Please get fasting blood tests done before the next visit.

## 2023-04-13 NOTE — Assessment & Plan Note (Addendum)
 Resolved now Was likely due to hydrochlorothiazide, usually remains wnl Advised to increase potassium rich food intake Check repeat CMP before next visit

## 2023-04-13 NOTE — Assessment & Plan Note (Signed)
 BP Readings from Last 1 Encounters:  04/13/23 114/72   Well-controlled with Amlodipine 10 mg QD and HCTZ 25 mg QD Counseled for compliance with the medications Advised DASH diet and moderate exercise/walking, at least 150 mins/week

## 2023-04-13 NOTE — Progress Notes (Signed)
 Established Patient Office Visit  Subjective:  Patient ID: Betty Dawson, female    DOB: [birth year]  Age: 68 y.o. MRN: [id number]  CC:  Chief Complaint  Patient presents with   Care Management    6 month f/u    HPI [name] is a 68 y.o. female with past medical history of HTN, OA, DDD of lumbar spine and iron deficiency anemia who presents for f/u of her chronic medical conditions.  HTN: BP is well-controlled. Takes amlodipine regularly.  She takes amlodipine 10 mg once daily and HCTZ 25 mg once daily. Patient denies headache, dizziness, chest pain, dyspnea or palpitations.  Chronic low back pain: She has chronic, intermittent low back pain, radiating to b/l LE with numbness of left foot, worse with movement and walking and better with rest.  She has done physical therapy for low back pain and LE weakness, which has slowly improved her symptoms. She has seen spine surgery for DDD of lumbar spine and was advised PT.  She denies any recent fall or injury.  Blood tests were reviewed and discussed with the patient in detail.    Past Medical History:  Diagnosis Date   Anemia    Arthritis    OA multiple joints   Heart murmur    Heel spur    Hypertension    Varicose veins     Past Surgical History:  Procedure Laterality Date   ABDOMINAL HYSTERECTOMY     endometriosis   BREAST BIOPSY Right 2017   HYALINIZED FIBROADENOMA WITH CALCIFICATIONS/   COLONOSCOPY  02/21/2013   WUJ:WJXBJY polyp measuring 6 mm in size was found in the distal/transverse colon; polypectomy was performed using snare cautery/The colon was redundant/The colon mucosa was otherwise normal/Normal mucosa in the terminal ileum   COLONOSCOPY N/A 02/21/2013   Procedure: COLONOSCOPY;  Surgeon: West Bali, MD;  Location: [facility];  Service: Endoscopy;  Laterality: N/A;  10:30 AM   FOOT SURGERY Right    heel spur   SHOULDER ARTHROSCOPY WITH ROTATOR CUFF REPAIR Right 01/30/2016   Procedure: SHOULDER  ARTHROSCOPY WITH ACROMIOPLASTY;  Surgeon: Vickki Hearing, MD;  Location: [facility];  Service: Orthopedics;  Laterality: Right;  pt knows to arrive at 7:15   SHOULDER OPEN ROTATOR CUFF REPAIR Right 01/30/2016   Procedure: ROTATOR CUFF REPAIR SHOULDER OPEN;  Surgeon: Vickki Hearing, MD;  Location: [facility];  Service: Orthopedics;  Laterality: Right;    Family History  Problem Relation Age of Onset   Hypertension Mother    Cancer Mother    Diabetes Sister    Hyperlipidemia Sister    Hypertension Sister    Diabetes Brother    Hypertension Brother     Social History   Socioeconomic History   Marital status: Single    Spouse name: Not on file   Number of children: Not on file   Years of education: Not on file   Highest education level: Not on file  Occupational History   Not on file  Tobacco Use   Smoking status: Never   Smokeless tobacco: Never  Substance and Sexual Activity   Alcohol use: No   Drug use: No   Sexual activity: Not Currently    Birth control/protection: Surgical  Other Topics Concern   Not on file  Social History Narrative   Not on file   Social Drivers of Health   Financial Resource Strain: Low Risk  (11/02/2022)   Overall Financial Resource Strain (CARDIA)  Difficulty of Paying Living Expenses: Not hard at all  Food Insecurity: No Food Insecurity (11/02/2022)   Hunger Vital Sign    Worried About Running Out of Food in the Last Year: Never true    Ran Out of Food in the Last Year: Never true  Transportation Needs: No Transportation Needs (11/02/2022)   PRAPARE - Administrator, Civil Service (Medical): No    Lack of Transportation (Non-Medical): No  Physical Activity: Insufficiently Active (11/02/2022)   Exercise Vital Sign    Days of Exercise per Week: 3 days    Minutes of Exercise per Session: 30 min  Stress: No Stress Concern Present (11/02/2022)   [employer] of Occupational Health - Occupational Stress Questionnaire    Feeling  of Stress : Not at all  Social Connections: Socially Isolated (11/02/2022)   Social Connection and Isolation Panel [NHANES]    Frequency of Communication with Friends and Family: More than three times a week    Frequency of Social Gatherings with Friends and Family: More than three times a week    Attends Religious Services: Never    Database administrator or Organizations: No    Attends Banker Meetings: Never    Marital Status: Widowed  Intimate Partner Violence: Not At Risk (11/02/2022)   Humiliation, Afraid, Rape, and Kick questionnaire    Fear of Current or Ex-Partner: No    Emotionally Abused: No    Physically Abused: No    Sexually Abused: No    Outpatient Medications Prior to Visit  Medication Sig Dispense Refill   acetaminophen (TYLENOL) 500 MG tablet Take 1,000 mg by mouth every 6 (six) hours as needed (pain).      albuterol (VENTOLIN HFA) 108 (90 Base) MCG/ACT inhaler Inhale 2 puffs into the lungs every 4 (four) hours as needed for wheezing or shortness of breath. 18 g 0   cyclobenzaprine (FLEXERIL) 10 MG tablet TAKE 1 TABLET BY MOUTH TWICE A DAY AS NEEDED FOR MUSCLE SPASM 30 tablet 1   Ferrous Sulfate Dried 143 (45 Fe) MG TBCR Take 1 tablet by mouth daily. 30 tablet 3   gabapentin (NEURONTIN) 300 MG capsule Take 1 capsule (300 mg total) by mouth at bedtime. 30 capsule 5   ibuprofen (ADVIL,MOTRIN) 800 MG tablet Take 1 tablet (800 mg total) by mouth every 8 (eight) hours as needed. 90 tablet 5   amLODipine (NORVASC) 10 MG tablet Take 1 tablet (10 mg total) by mouth daily. DISCONTINUE AMLODIPINE 5 MG PRESCRIPTION. 90 tablet 1   hydrochlorothiazide (HYDRODIURIL) 25 MG tablet TAKE 1 TABLET (25 MG TOTAL) BY MOUTH DAILY. 90 tablet 1   promethazine-dextromethorphan (PROMETHAZINE-DM) 6.25-15 MG/5ML syrup Take 5 mLs by mouth 4 (four) times daily as needed. 100 mL 0   No facility-administered medications prior to visit.    Allergies  Allergen Reactions   Lisinopril  Swelling    ROS Review of Systems  Constitutional:  Negative for chills and fever.  HENT:  Negative for congestion, sinus pressure, sinus pain and sore throat.   Eyes:  Negative for pain and discharge.  Respiratory:  Negative for cough and shortness of breath.   Cardiovascular:  Negative for chest pain and palpitations.  Gastrointestinal:  Negative for abdominal pain, constipation, diarrhea, nausea and vomiting.  Endocrine: Negative for polydipsia and polyuria.  Genitourinary:  Negative for dysuria and hematuria.  Musculoskeletal:  Positive for arthralgias and back pain. Negative for neck pain and neck stiffness.  Skin:  Negative for  rash.  Neurological:  Negative for dizziness and weakness.  Psychiatric/Behavioral:  Negative for agitation and behavioral problems.       Objective:    Physical Exam Vitals reviewed.  Constitutional:      General: She is not in acute distress.    Appearance: She is not diaphoretic.  HENT:     Head: Normocephalic and atraumatic.     Nose: Nose normal.     Mouth/Throat:     Mouth: Mucous membranes are moist.  Eyes:     General: No scleral icterus.    Extraocular Movements: Extraocular movements intact.  Neck:     Vascular: No carotid bruit.  Cardiovascular:     Rate and Rhythm: Normal rate and regular rhythm.     Heart sounds: Normal heart sounds. No murmur heard. Pulmonary:     Breath sounds: Normal breath sounds. No wheezing or rales.  Musculoskeletal:     Cervical back: Neck supple. No tenderness.     Right lower leg: No edema.     Left lower leg: No edema.  Skin:    General: Skin is warm.     Findings: No rash.  Neurological:     General: No focal deficit present.     Mental Status: She is alert and oriented to person, place, and time.     Sensory: No sensory deficit.     Motor: No weakness.  Psychiatric:        Mood and Affect: Mood normal.        Behavior: Behavior normal.     BP 114/72   Pulse 69   Ht 5\' 8"  (1.727 m)    Wt 189 lb 9.6 oz (86 kg)   SpO2 94%   BMI 28.83 kg/m  Wt Readings from Last 3 Encounters:  04/13/23 189 lb 9.6 oz (86 kg)  11/02/22 184 lb (83.5 kg)  10/14/22 184 lb 3.2 oz (83.6 kg)    Lab Results  Component Value Date   TSH 0.783 10/09/2022   Lab Results  Component Value Date   WBC 4.8 10/09/2022   HGB 12.1 10/09/2022   HCT 37.3 10/09/2022   MCV 86 10/09/2022   PLT 318 10/09/2022   Lab Results  Component Value Date   NA 139 04/07/2023   K 3.7 04/07/2023   CO2 28 04/07/2023   GLUCOSE 97 04/07/2023   BUN 17 04/07/2023   CREATININE 0.55 (L) 04/07/2023   BILITOT 0.3 10/09/2022   ALKPHOS 107 10/09/2022   AST 15 10/09/2022   ALT 8 10/09/2022   PROT 7.4 10/09/2022   ALBUMIN 4.2 10/09/2022   CALCIUM 9.3 04/07/2023   ANIONGAP 7 01/28/2016   EGFR 100 04/07/2023   Lab Results  Component Value Date   CHOL 147 10/09/2022   Lab Results  Component Value Date   HDL 63 10/09/2022   Lab Results  Component Value Date   LDLCALC 73 10/09/2022   Lab Results  Component Value Date   TRIG 51 10/09/2022   Lab Results  Component Value Date   CHOLHDL 2.3 10/09/2022   Lab Results  Component Value Date   HGBA1C 5.7 (H) 10/09/2022      Assessment & Plan:   Problem List Items Addressed This Visit       Cardiovascular and Mediastinum   HTN (hypertension) - Primary   BP Readings from Last 1 Encounters:  04/13/23 114/72   Well-controlled with Amlodipine 10 mg QD and HCTZ 25 mg QD Counseled for compliance with the  medications Advised DASH diet and moderate exercise/walking, at least 150 mins/week      Relevant Medications   amLODipine (NORVASC) 10 MG tablet   hydrochlorothiazide (HYDRODIURIL) 25 MG tablet   Other Relevant Orders   TSH   CMP14+EGFR   CBC with Differential/Platelet     Musculoskeletal and Integument   DDD (degenerative disc disease), lumbar   Chronic low back pain radiating to left LE On Gabapentin and PRN Tylenol, but does not take Gabapentin  regularly Flexeril 10 mg PRN for muscle spasms MRI lumbar spine reviewed Has seen spine surgeon Has done physical therapy        Other   Hypokalemia   Resolved now Was likely due to hydrochlorothiazide, usually remains wnl Advised to increase potassium rich food intake Check repeat CMP before next visit      Pain in toes of both feet   Reports pain on the tip of the toes Could be from ingrown toenail No visible callus currently Referred to Podiatry      Relevant Orders   Ambulatory referral to Podiatry   Prediabetes   Lab Results  Component Value Date   HGBA1C 5.7 (H) 10/09/2022   Advised to follow DASH diet      Relevant Orders   Hemoglobin A1c   CMP14+EGFR   Other Visit Diagnoses       Colon cancer screening       Relevant Orders   Ambulatory referral to Gastroenterology     Vitamin D deficiency       Relevant Orders   VITAMIN D 25 Hydroxy (Vit-D Deficiency, Fractures)     Mixed hyperlipidemia       Relevant Medications   amLODipine (NORVASC) 10 MG tablet   hydrochlorothiazide (HYDRODIURIL) 25 MG tablet   Other Relevant Orders   Lipid panel       Meds ordered this encounter  Medications   amLODipine (NORVASC) 10 MG tablet    Sig: Take 1 tablet (10 mg total) by mouth daily. DISCONTINUE AMLODIPINE 5 MG PRESCRIPTION.    Dispense:  90 tablet    Refill:  1   hydrochlorothiazide (HYDRODIURIL) 25 MG tablet    Sig: Take 1 tablet (25 mg total) by mouth daily.    Dispense:  90 tablet    Refill:  1    Follow-up: Return in about 6 months (around 10/14/2023) for Annual physical.    Anabel Halon, MD

## 2023-04-13 NOTE — Assessment & Plan Note (Signed)
 Lab Results  Component Value Date   HGBA1C 5.7 (H) 10/09/2022   Advised to follow DASH diet

## 2023-04-13 NOTE — Assessment & Plan Note (Signed)
 Reports pain on the tip of the toes Could be from ingrown toenail No visible callus currently Referred to Podiatry

## 2023-04-15 ENCOUNTER — Encounter: Payer: Self-pay | Admitting: *Deleted

## 2023-04-19 ENCOUNTER — Ambulatory Visit (INDEPENDENT_AMBULATORY_CARE_PROVIDER_SITE_OTHER)

## 2023-04-19 ENCOUNTER — Ambulatory Visit: Admitting: Podiatry

## 2023-04-19 ENCOUNTER — Other Ambulatory Visit: Payer: Self-pay | Admitting: Podiatry

## 2023-04-19 DIAGNOSIS — M2042 Other hammer toe(s) (acquired), left foot: Secondary | ICD-10-CM

## 2023-04-19 DIAGNOSIS — M2041 Other hammer toe(s) (acquired), right foot: Secondary | ICD-10-CM | POA: Diagnosis not present

## 2023-04-19 DIAGNOSIS — M79674 Pain in right toe(s): Secondary | ICD-10-CM

## 2023-04-19 DIAGNOSIS — M79675 Pain in left toe(s): Secondary | ICD-10-CM

## 2023-04-19 NOTE — Patient Instructions (Signed)
 More silicone pads can be purchased from:  [email or web address]     VISIT SUMMARY:  Betty Dawson, a 68 year old female, visited [place] today due to painful hammer toes, particularly in the second and third toes of both feet, with the right foot being more affected. The pain is exacerbated by wearing certain shoes, and she has flat feet, which may be contributing to her condition.  YOUR PLAN:  -HAMMER TOES: Hammer toes are a condition where the toes are bent in a claw-like position, often causing pain and discomfort. For your semi-reducible hammer toes in the second and third toes of both feet, we recommend using silicone pads to alleviate pressure and pain. Please use [employer] silicone pads as instructed. If these conservative measures do not provide relief, we may consider surgical intervention to release the tendons.  -PES PLANUS: Pes planus, or flat feet, is a condition where the arches of the feet are flattened, which can lead to tendon tightening and toe contraction, contributing to hammer toes. We will monitor this condition and assess the effectiveness of the conservative measures for your hammer toes.  INSTRUCTIONS:  Please follow the instructions for using [employer] silicone pads to help alleviate the pain in your toes. If you do not experience relief from these measures, we may need to consider surgical options. Continue to monitor your condition and report any changes or persistent issues during your next visit.

## 2023-04-19 NOTE — Progress Notes (Signed)
  Subjective:  Patient ID: Betty Dawson, female    DOB: 04/23/1955,  MRN: [id number]  Chief Complaint  Patient presents with   Toe Pain    Pt stated that she has been getting pain in the tips of her toes when she walks, she stated that she thought it was hammertoes but at times she gets a hard place on the tip of her toes. She stated that this has been going on for a while.     Discussed the use of AI scribe software for clinical note transcription with the patient, who gave verbal consent to proceed.  History of Present Illness Betty Dawson is a 68 year old female who presents with painful hammer toes.  She experiences pain in the second and third toes on both feet, with the right foot being more affected. The pain intensifies when wearing certain shoes, necessitating her removal for relief. The fourth and fifth toes do not cause discomfort.  She has flat feet, which may contribute to her current toe issues. She describes a sensation of tightness in the affected toes, attributing it to the tendons pulling on them. She has not mentioned any prior treatments for this condition.      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 Pulses palpable  No images are attached to the encounter.    Results RADIOLOGY Foot X-ray: pes planovalgus deformity with hindfoot arthritis, digital contractures, and hallux valgus (04/19/2023)   Assessment:   1. Pain in toes of both feet      Plan:  Patient was evaluated and treated and all questions answered.  Assessment and Plan Assessment & Plan Hammer toes Semi-reducible hammer toes of the second and third lesser toes bilaterally, with the right foot more symptomatic. Exacerbated by pes planus, leading to  tendon tightening and toe contraction. No significant arthritis, favoring conservative management. - Provide silicone pads to alleviate pressure and pain. - Instruct on the use of [employer] silicone pads. - Consider surgical intervention for tendon release if conservative measures fail.  Pes planus Flat feet contribute to hammer toes by causing tendon tightening and toe contraction. Chronic condition requiring long-term management. - Monitor condition and assess effectiveness of conservative measures for hammer toes.      No follow-ups on file.

## 2023-04-28 ENCOUNTER — Telehealth: Payer: Self-pay | Admitting: *Deleted

## 2023-04-28 NOTE — Telephone Encounter (Signed)
  Procedure: colonoscopy  Height: 5'8" Weight: 189 lb        Have you had a colonoscopy before?  02/21/13, Dr.Fields   Do you have family history of colon cancer?  no  Do you have a family history of polyps? no  Previous colonoscopy with polyps removed? yes  Do you have a history colorectal cancer?   no  Are you diabetic?  no  Do you have a prosthetic or mechanical heart valve? no  Do you have a pacemaker/defibrillator?   no  Have you had endocarditis/atrial fibrillation?  no  Do you use supplemental oxygen/CPAP?  no  Have you had joint replacement within the last 12 months?  no  Do you tend to be constipated or have to use laxatives?  no   Do you have history of alcohol use? If yes, how much and how often.  no  Do you have history or are you using drugs? If yes, what do are you  using?  no  Have you ever had a stroke/heart attack?  no  Have you ever had a heart or other vascular stent placed,?no  Do you take weight loss medication? no  female patients,: have you had a hysterectomy? yes                              are you post menopausal?  no                              do you still have your menstrual cycle? no    Date of last menstrual period? No answer  Do you take any blood-thinning medications such as: (Plavix, aspirin, Coumadin, Aggrenox, Brilinta, Xarelto, Eliquis, Pradaxa, Savaysa or Effient)? no  If yes we need the name, milligram, dosage and who is prescribing doctor:  n/a             Current Outpatient Medications  Medication Sig Dispense Refill   acetaminophen (TYLENOL) 500 MG tablet Take 1,000 mg by mouth every 6 (six) hours as needed (pain).      amLODipine (NORVASC) 10 MG tablet Take 1 tablet (10 mg total) by mouth daily. DISCONTINUE AMLODIPINE 5 MG PRESCRIPTION. 90 tablet 1   cyclobenzaprine (FLEXERIL) 10 MG tablet TAKE 1 TABLET BY MOUTH TWICE A DAY AS NEEDED FOR MUSCLE SPASM 30 tablet 1   Ferrous Sulfate Dried 143 (45 Fe) MG TBCR Take 1 tablet by  mouth daily. 30 tablet 3   gabapentin (NEURONTIN) 300 MG capsule Take 1 capsule (300 mg total) by mouth at bedtime. 30 capsule 5   hydrochlorothiazide (HYDRODIURIL) 25 MG tablet Take 1 tablet (25 mg total) by mouth daily. 90 tablet 1   ibuprofen (ADVIL,MOTRIN) 800 MG tablet Take 1 tablet (800 mg total) by mouth every 8 (eight) hours as needed. 90 tablet 5   SPIKEVAX syringe      albuterol (VENTOLIN HFA) 108 (90 Base) MCG/ACT inhaler Inhale 2 puffs into the lungs every 4 (four) hours as needed for wheezing or shortness of breath. 18 g 0   No current facility-administered medications for this visit.    Allergies  Allergen Reactions   Lisinopril Swelling

## 2023-05-30 NOTE — Telephone Encounter (Signed)
 OK to schedule.  ASA 2 Hold iron x 7 days prior BMP prior due to HCTZ

## 2023-05-31 NOTE — Telephone Encounter (Signed)
 Pt would like a Friday in the afternoon due to transportation issues. She would like a call back once we have something available.

## 2023-06-16 ENCOUNTER — Encounter: Payer: Self-pay | Admitting: *Deleted

## 2023-06-16 ENCOUNTER — Other Ambulatory Visit: Payer: Self-pay | Admitting: *Deleted

## 2023-06-16 DIAGNOSIS — Z860100 Personal history of colon polyps, unspecified: Secondary | ICD-10-CM

## 2023-06-16 MED ORDER — PEG 3350-KCL-NA BICARB-NACL 420 G PO SOLR: 4000.0000 mL | Freq: Once | ORAL | 0 refills | Status: AC

## 2023-06-16 NOTE — Telephone Encounter (Signed)
 Pt has been scheduled with Dr.Carver on 07/09/23. Instructions mailed and prep sent to the pharmacy

## 2023-06-17 ENCOUNTER — Encounter (INDEPENDENT_AMBULATORY_CARE_PROVIDER_SITE_OTHER): Payer: Self-pay | Admitting: *Deleted

## 2023-06-17 ENCOUNTER — Encounter: Payer: Self-pay | Admitting: *Deleted

## 2023-06-17 NOTE — Telephone Encounter (Signed)
 Referral completed, [facility] apt letter sent to PCP

## 2023-06-18 DIAGNOSIS — Z860100 Personal history of colon polyps, unspecified: Secondary | ICD-10-CM | POA: Diagnosis not present

## 2023-06-19 LAB — BASIC METABOLIC PANEL WITH GFR
BUN/Creatinine Ratio: 27 (ref 12–28)
BUN: 14 mg/dL (ref 8–27)
CO2: 26 mmol/L (ref 20–29)
Calcium: 9.6 mg/dL (ref 8.7–10.3)
Chloride: 99 mmol/L (ref 96–106)
Creatinine, Ser: 0.51 mg/dL — ABNORMAL LOW (ref 0.57–1.00)
Glucose: 97 mg/dL (ref 70–99)
Potassium: 4.2 mmol/L (ref 3.5–5.2)
Sodium: 141 mmol/L (ref 134–144)
eGFR: 101 mL/min/{1.73_m2} (ref >59)

## 2023-07-06 ENCOUNTER — Encounter (HOSPITAL_COMMUNITY)
Admission: RE | Admit: 2023-07-06 | Discharge: 2023-07-06 | Disposition: A | Discharge status: Home / Self Care | Source: Ambulatory Visit | Attending: Internal Medicine | Admitting: Internal Medicine

## 2023-07-06 ENCOUNTER — Other Ambulatory Visit: Payer: Self-pay

## 2023-07-06 ENCOUNTER — Encounter (HOSPITAL_COMMUNITY): Payer: Self-pay

## 2023-07-06 VITALS — Ht 68.0 in | Wt 182.0 lb

## 2023-07-06 DIAGNOSIS — I10 Essential (primary) hypertension: Secondary | ICD-10-CM

## 2023-07-09 ENCOUNTER — Ambulatory Visit (HOSPITAL_COMMUNITY)
Admission: RE | Admit: 2023-07-09 | Discharge: 2023-07-09 | Disposition: A | Discharge status: Home / Self Care | Source: Ambulatory Visit | Attending: Internal Medicine | Admitting: Internal Medicine

## 2023-07-09 ENCOUNTER — Ambulatory Visit (HOSPITAL_COMMUNITY): Admitting: Anesthesiology

## 2023-07-09 ENCOUNTER — Encounter (HOSPITAL_COMMUNITY)
Admission: RE | Disposition: A | Discharge status: Home / Self Care | Payer: Self-pay | Source: Ambulatory Visit | Attending: Internal Medicine

## 2023-07-09 ENCOUNTER — Encounter (HOSPITAL_COMMUNITY): Payer: Self-pay | Admitting: Internal Medicine

## 2023-07-09 ENCOUNTER — Other Ambulatory Visit: Payer: Self-pay

## 2023-07-09 DIAGNOSIS — I10 Essential (primary) hypertension: Secondary | ICD-10-CM

## 2023-07-09 DIAGNOSIS — K648 Other hemorrhoids: Secondary | ICD-10-CM | POA: Diagnosis not present

## 2023-07-09 DIAGNOSIS — Z1211 Encounter for screening for malignant neoplasm of colon: Secondary | ICD-10-CM

## 2023-07-09 DIAGNOSIS — K529 Noninfective gastroenteritis and colitis, unspecified: Secondary | ICD-10-CM

## 2023-07-09 DIAGNOSIS — K6389 Other specified diseases of intestine: Secondary | ICD-10-CM

## 2023-07-09 HISTORY — PX: COLONOSCOPY: SHX5424

## 2023-07-09 SURGERY — COLONOSCOPY
Anesthesia: General

## 2023-07-09 MED ORDER — PROPOFOL 500 MG/50ML IV EMUL
INTRAVENOUS | Status: DC | PRN
  Administered 2023-07-09: 150 ug/kg/min via INTRAVENOUS

## 2023-07-09 MED ORDER — LIDOCAINE 2% (20 MG/ML) 5 ML SYRINGE
INTRAMUSCULAR | Status: DC | PRN
  Administered 2023-07-09: 50 mg via INTRAVENOUS

## 2023-07-09 MED ORDER — LACTATED RINGERS IV SOLN: INTRAVENOUS | Status: DC

## 2023-07-09 MED ORDER — PROPOFOL 10 MG/ML IV BOLUS
INTRAVENOUS | Status: DC | PRN
Start: 2023-07-09 — End: 2023-07-09
  Administered 2023-07-09: 50 mg via INTRAVENOUS

## 2023-07-09 NOTE — Anesthesia Postprocedure Evaluation (Signed)
 Anesthesia Post Note  Patient: Betty Dawson  Procedure(s) Performed: COLONOSCOPY  Patient location during evaluation: Phase II Anesthesia Type: General Level of consciousness: awake and alert Pain management: pain level controlled Vital Signs Assessment: post-procedure vital signs reviewed and stable Respiratory status: spontaneous breathing, nonlabored ventilation and respiratory function stable Cardiovascular status: blood pressure returned to baseline and stable Postop Assessment: no apparent nausea or vomiting Anesthetic complications: no   There were no known notable events for this encounter.   Last Vitals:  Vitals:   07/09/23 1155 07/09/23 1253  BP: 139/70 108/61  Pulse: 60 60  Resp: 20 16  Temp: 36.6 C (!) 36.3 C  SpO2: 99% 99%    Last Pain:  Vitals:   07/09/23 1253  TempSrc: Axillary  PainSc: 0-No pain                 [place] Gamero L Kanaan Kagawa

## 2023-07-09 NOTE — Transfer of Care (Signed)
 Immediate Anesthesia Transfer of Care Note  Patient: Betty Dawson  Procedure(s) Performed: COLONOSCOPY  Patient Location: Short Stay  Anesthesia Type:General  Level of Consciousness: awake  Airway & Oxygen Therapy: Patient Spontanous Breathing  Post-op Assessment: Report given to RN  Post vital signs: Reviewed and stable  Last Vitals:  Vitals Value Taken Time  BP    Temp    Pulse    Resp    SpO2      Last Pain:  Vitals:   07/09/23 1215  TempSrc:   PainSc: 0-No pain      Patients Stated Pain Goal: 7 (07/09/23 1155)  Complications: No notable events documented.

## 2023-07-09 NOTE — H&P (Signed)
 Primary Care Physician:  Meldon Sport, MD Primary Gastroenterologist:  Dr. Mordechai April  Pre-Procedure History & Physical: HPI:  Betty Dawson is a 68 y.o. female is here for a colonoscopy for colon cancer screening purposes.  Patient denies any family history of colorectal cancer.  Past Medical History:  Diagnosis Date   Anemia    Arthritis    OA multiple joints   Heart murmur    Heel spur    Hypertension    Varicose veins     Past Surgical History:  Procedure Laterality Date   ABDOMINAL HYSTERECTOMY     endometriosis   BREAST BIOPSY Right 2017   HYALINIZED FIBROADENOMA WITH CALCIFICATIONS/   COLONOSCOPY  02/21/2013   NWG:NFAOZH polyp measuring 6 mm in size was found in the distal/transverse colon; polypectomy was performed using snare cautery/The colon was redundant/The colon mucosa was otherwise normal/Normal mucosa in the terminal ileum   COLONOSCOPY N/A 02/21/2013   Procedure: COLONOSCOPY;  Surgeon: Alyce Jubilee, MD;  Location: [facility];  Service: Endoscopy;  Laterality: N/A;  10:30 AM   FOOT SURGERY Right    heel spur   SHOULDER ARTHROSCOPY WITH ROTATOR CUFF REPAIR Right 01/30/2016   Procedure: SHOULDER ARTHROSCOPY WITH ACROMIOPLASTY;  Surgeon: Darrin Emerald, MD;  Location: [facility];  Service: Orthopedics;  Laterality: Right;  pt knows to arrive at 7:15   SHOULDER OPEN ROTATOR CUFF REPAIR Right 01/30/2016   Procedure: ROTATOR CUFF REPAIR SHOULDER OPEN;  Surgeon: Darrin Emerald, MD;  Location: [facility];  Service: Orthopedics;  Laterality: Right;    Prior to Admission medications   Medication Sig Start Date End Date Taking? Authorizing Provider  acetaminophen  (TYLENOL ) 500 MG tablet Take 1,000 mg by mouth every 6 (six) hours as needed (pain).    Yes [provider]  amLODipine  (NORVASC ) 10 MG tablet Take 1 tablet (10 mg total) by mouth daily. DISCONTINUE AMLODIPINE  5 MG PRESCRIPTION. 04/13/23  Yes Meldon Sport, MD  hydrochlorothiazide  (HYDRODIURIL ) 25  MG tablet Take 1 tablet (25 mg total) by mouth daily. 04/13/23  Yes Meldon Sport, MD  albuterol  (VENTOLIN  HFA) 108 (90 Base) MCG/ACT inhaler Inhale 2 puffs into the lungs every 4 (four) hours as needed for wheezing or shortness of breath. 08/31/22   Corbin Dess, PA-C  cyclobenzaprine  (FLEXERIL ) 10 MG tablet TAKE 1 TABLET BY MOUTH TWICE A DAY AS NEEDED FOR MUSCLE SPASM 04/07/21   Meldon Sport, MD  Ferrous Sulfate  Dried 143 (45 Fe) MG TBCR Take 1 tablet by mouth daily. 05/07/17   [place], Marvell Slider, MD  gabapentin  (NEURONTIN ) 300 MG capsule Take 1 capsule (300 mg total) by mouth at bedtime. 09/18/20   Meldon Sport, MD  ibuprofen  (ADVIL ,MOTRIN ) 800 MG tablet Take 1 tablet (800 mg total) by mouth every 8 (eight) hours as needed. 03/23/16   Darrin Emerald, MD  [facility] syringe  10/23/22   [provider]    Allergies as of 06/16/2023 - Review Complete 04/28/2023  Allergen Reaction Noted   Lisinopril Swelling 04/25/2013    Family History  Problem Relation Age of Onset   Hypertension Mother    Cancer Mother    Diabetes Sister    Hyperlipidemia Sister    Hypertension Sister    Diabetes Brother    Hypertension Brother     Social History   Socioeconomic History   Marital status: Single    Spouse name: Not on file   Number of children: Not on file  Years of education: Not on file   Highest education level: Not on file  Occupational History   Not on file  Tobacco Use   Smoking status: Never   Smokeless tobacco: Never  Vaping Use   Vaping status: Never Used  Substance and Sexual Activity   Alcohol use: No   Drug use: No   Sexual activity: Not Currently    Birth control/protection: Surgical  Other Topics Concern   Not on file  Social History Narrative   Not on file   Social Drivers of Health   Financial Resource Strain: Low Risk  (11/02/2022)   Overall Financial Resource Strain (CARDIA)    Difficulty of Paying Living Expenses: Not hard at all  Food  Insecurity: No Food Insecurity (11/02/2022)   Hunger Vital Sign    Worried About Running Out of Food in the Last Year: Never true    Ran Out of Food in the Last Year: Never true  Transportation Needs: No Transportation Needs (11/02/2022)   PRAPARE - Administrator, Civil Service (Medical): No    Lack of Transportation (Non-Medical): No  Physical Activity: Insufficiently Active (11/02/2022)   Exercise Vital Sign    Days of Exercise per Week: 3 days    Minutes of Exercise per Session: 30 min  Stress: No Stress Concern Present (11/02/2022)   [employer] of Occupational Health - Occupational Stress Questionnaire    Feeling of Stress : Not at all  Social Connections: Socially Isolated (11/02/2022)   Social Connection and Isolation Panel    Frequency of Communication with Friends and Family: More than three times a week    Frequency of Social Gatherings with Friends and Family: More than three times a week    Attends Religious Services: Never    Database administrator or Organizations: No    Attends Banker Meetings: Never    Marital Status: Widowed  Intimate Partner Violence: Not At Risk (11/02/2022)   Humiliation, Afraid, Rape, and Kick questionnaire    Fear of Current or Ex-Partner: No    Emotionally Abused: No    Physically Abused: No    Sexually Abused: No    Review of Systems: See HPI, otherwise negative ROS  Physical Exam: Vital signs in last 24 hours: Temp:  [97.9 F (36.6 C)] 97.9 F (36.6 C) (06/13 1155) Pulse Rate:  [60] 60 (06/13 1155) Resp:  [20] 20 (06/13 1155) BP: (139)/(70) 139/70 (06/13 1155) SpO2:  [99 %] 99 % (06/13 1155)   General:   Alert,  Well-developed, well-nourished, pleasant and cooperative in NAD Head:  Normocephalic and atraumatic. Eyes:  Sclera clear, no icterus.   Conjunctiva pink. Ears:  Normal auditory acuity. Nose:  No deformity, discharge,  or lesions. Msk:  Symmetrical without gross deformities. Normal  posture. Extremities:  Without clubbing or edema. Neurologic:  Alert and  oriented x4;  grossly normal neurologically. Skin:  Intact without significant lesions or rashes. Psych:  Alert and cooperative. Normal mood and affect.  Impression/Plan: Betty Dawson is here for a colonoscopy to be performed for colon cancer screening purposes.  The risks of the procedure including infection, bleed, or perforation as well as benefits, limitations, alternatives and imponderables have been reviewed with the patient. Questions have been answered. All parties agreeable.

## 2023-07-09 NOTE — Discharge Instructions (Addendum)
  Colonoscopy Discharge Instructions  Read the instructions outlined below and refer to this sheet in the next few weeks. These discharge instructions provide you with general information on caring for yourself after you leave the hospital. Your doctor may also give you specific instructions. While your treatment has been planned according to the most current medical practices available, unavoidable complications occasionally occur.   ACTIVITY You may resume your regular activity, but move at a slower pace for the next 24 hours.  Take frequent rest periods for the next 24 hours.  Walking will help get rid of the air and reduce the bloated feeling in your belly (abdomen).  No driving for 24 hours (because of the medicine (anesthesia) used during the test).   Do not sign any important legal documents or operate any machinery for 24 hours (because of the anesthesia used during the test).  NUTRITION Drink plenty of fluids.  You may resume your normal diet as instructed by your doctor.  Begin with a light meal and progress to your normal diet. Heavy or fried foods are harder to digest and may make you feel sick to your stomach (nauseated).  Avoid alcoholic beverages for 24 hours or as instructed.  MEDICATIONS You may resume your normal medications unless your doctor tells you otherwise.  WHAT YOU CAN EXPECT TODAY Some feelings of bloating in the abdomen.  Passage of more gas than usual.  Spotting of blood in your stool or on the toilet paper.  IF YOU HAD POLYPS REMOVED DURING THE COLONOSCOPY: No aspirin products for 7 days or as instructed.  No alcohol for 7 days or as instructed.  Eat a soft diet for the next 24 hours.  FINDING OUT THE RESULTS OF YOUR TEST Not all test results are available during your visit. If your test results are not back during the visit, make an appointment with your caregiver to find out the results. Do not assume everything is normal if you have not heard from your  caregiver or the medical facility. It is important for you to follow up on all of your test results.  SEEK IMMEDIATE MEDICAL ATTENTION IF: You have more than a spotting of blood in your stool.  Your belly is swollen (abdominal distention).  You are nauseated or vomiting.  You have a temperature over 101.  You have abdominal pain or discomfort that is severe or gets worse throughout the day.   Your colonoscopy was relatively unremarkable.  I did not find any polyps or evidence of colon cancer.  I recommend repeating colonoscopy in 10 years for colon cancer screening purposes.    Mild inflammation noted on the right side your colon of unclear significance.  I took samples of this area.  We will call with these results.  You also have internal hemorrhoids. I would recommend increasing fiber in your diet or adding OTC Benefiber/Metamucil. Be sure to drink at least 4 to 6 glasses of water  daily.   I hope you have a great rest of your week!  Rolando Cliche. Mordechai April, D.O. Gastroenterology and Hepatology [facility]

## 2023-07-09 NOTE — Op Note (Signed)
 [facility] Patient Name: Betty Dawson Procedure Date: 07/09/2023 12:01 PM MRN: [id number] Date of Birth: 1955/05/26 Attending MD: Rolando Cliche. Mordechai April , [place], [id number] CSN: [id number] Age: 68 Admit Type: Outpatient Procedure:                Colonoscopy Indications:              Screening for colorectal malignant neoplasm Providers:                Rolando Cliche. Mordechai April, DO, Troy Furnish. Hazeline Lister RN, RN,                            Theola Fitch Referring MD:              Medicines:                See the Anesthesia note for documentation of the                            administered medications Complications:            No immediate complications. Estimated Blood Loss:     Estimated blood loss was minimal. Procedure:                Pre-Anesthesia Assessment:                           - The anesthesia plan was to use monitored                            anesthesia care (MAC).                           After obtaining informed consent, the colonoscope                            was passed under direct vision. Throughout the                            procedure, the patient's blood pressure, pulse, and                            oxygen saturations were monitored continuously. The                            [id number]-HQ190L ([id number]) scope was introduced through                            the anus and advanced to the the terminal ileum,                            with identification of the appendiceal orifice and                            IC valve. The colonoscopy was performed without                            difficulty. The patient  tolerated the procedure                            well. The quality of the bowel preparation was                            evaluated using the BBPS [facility] Bowel Preparation                            Scale) with scores of: Right Colon = 2 (minor                            amount of residual staining, small fragments of                            stool and/or  opaque liquid, but mucosa seen well),                            Transverse Colon = 2 (minor amount of residual                            staining, small fragments of stool and/or opaque                            liquid, but mucosa seen well) and Left Colon = 2                            (minor amount of residual staining, small fragments                            of stool and/or opaque liquid, but mucosa seen                            well). The total BBPS score equals 6. The quality                            of the bowel preparation was good. Scope In: 12:26:31 PM Scope Out: 12:47:13 PM Scope Withdrawal Time: 0 hours 12 minutes 44 seconds  Total Procedure Duration: 0 hours 20 minutes 42 seconds  Findings:      Non-bleeding internal hemorrhoids were found.      The terminal ileum appeared normal.      Localized mild inflammation characterized by congestion (edema) and       erythema was found in the ascending colon. Biopsies were taken with a       cold forceps for histology.      The exam was otherwise without abnormality. Impression:               - Non-bleeding internal hemorrhoids.                           - The examined portion of the ileum was normal.                           -  Localized mild inflammation was found in the                            ascending colon. Biopsied.                           - The examination was otherwise normal. Moderate Sedation:      Per Anesthesia Care Recommendation:           - Patient has a contact number available for                            emergencies. The signs and symptoms of potential                            delayed complications were discussed with the                            patient. Return to normal activities tomorrow.                            Written discharge instructions were provided to the                            patient.                           - Resume previous diet.                           - Continue  present medications.                           - Await pathology results.                           - Repeat colonoscopy date to be determined after                            pending pathology results are reviewed for                            surveillance.                           - Return to GI clinic PRN. Procedure Code(s):        --- Professional ---                           [phone], Colonoscopy, flexible; with biopsy, single                            or multiple Diagnosis Code(s):        --- Professional ---                           Z12.11, Encounter for screening for malignant  neoplasm of colon                           K64.8, Other hemorrhoids                           K52.9, Noninfective gastroenteritis and colitis,                            unspecified CPT copyright 2022 American Medical Association. All rights reserved. The codes documented in this report are preliminary and upon coder review may  be revised to meet current compliance requirements. Rolando Cliche. Mordechai April, DO Rolando Cliche. Mordechai April, DO 07/09/2023 12:50:35 PM This report has been signed electronically. Number of Addenda: 0

## 2023-07-09 NOTE — Anesthesia Preprocedure Evaluation (Addendum)
 Anesthesia Evaluation  Patient identified by MRN, date of birth, ID band Patient awake    Reviewed: Allergy & Precautions, H&P , NPO status , Patient's Chart, lab work & pertinent test results, reviewed documented beta blocker date and time   Airway Mallampati: II  TM Distance: >3 FB Neck ROM: full    Dental  (+) Dental Advisory Given, Partial Upper, Missing   Pulmonary neg pulmonary ROS   Pulmonary exam normal breath sounds clear to auscultation       Cardiovascular Exercise Tolerance: Good hypertension,  Rhythm:regular Rate:Normal + Systolic murmurs Heart murmur   Neuro/Psych negative neurological ROS  negative psych ROS   GI/Hepatic negative GI ROS, Neg liver ROS,,,  Endo/Other  negative endocrine ROS    Renal/GU negative Renal ROS  negative genitourinary   Musculoskeletal  (+) Arthritis , Osteoarthritis,    Abdominal   Peds  Hematology negative hematology ROS (+)   Anesthesia Other Findings   Reproductive/Obstetrics negative OB ROS                             Anesthesia Physical Anesthesia Plan  ASA: 2  Anesthesia Plan: General   Post-op Pain Management: Minimal or no pain anticipated   Induction: Intravenous  PONV Risk Score and Plan: Propofol  infusion  Airway Management Planned: Natural Airway and Nasal Cannula  Additional Equipment: None  Intra-op Plan:   Post-operative Plan:   Informed Consent: I have reviewed the patients History and Physical, chart, labs and discussed the procedure including the risks, benefits and alternatives for the proposed anesthesia with the patient or authorized representative who has indicated his/her understanding and acceptance.     Dental Advisory Given  Plan Discussed with: CRNA  Anesthesia Plan Comments:        Anesthesia Quick Evaluation

## 2023-07-12 ENCOUNTER — Encounter (HOSPITAL_COMMUNITY): Payer: Self-pay | Admitting: Internal Medicine

## 2023-07-12 LAB — SURGICAL PATHOLOGY

## 2023-07-19 ENCOUNTER — Ambulatory Visit: Payer: Self-pay | Admitting: Internal Medicine

## 2023-07-29 ENCOUNTER — Ambulatory Visit (INDEPENDENT_AMBULATORY_CARE_PROVIDER_SITE_OTHER): Admitting: Internal Medicine

## 2023-07-29 ENCOUNTER — Encounter: Payer: Self-pay | Admitting: Internal Medicine

## 2023-07-29 VITALS — BP 143/76 | HR 65 | Temp 98.6°F | Ht 68.0 in | Wt 188.6 lb

## 2023-07-29 DIAGNOSIS — R109 Unspecified abdominal pain: Secondary | ICD-10-CM

## 2023-07-29 DIAGNOSIS — K529 Noninfective gastroenteritis and colitis, unspecified: Secondary | ICD-10-CM | POA: Diagnosis not present

## 2023-07-29 DIAGNOSIS — K5909 Other constipation: Secondary | ICD-10-CM

## 2023-07-29 DIAGNOSIS — R1033 Periumbilical pain: Secondary | ICD-10-CM

## 2023-07-29 DIAGNOSIS — K5900 Constipation, unspecified: Secondary | ICD-10-CM

## 2023-07-29 NOTE — Progress Notes (Addendum)
 Referring Provider: Tobie Suzzane POUR, MD Primary Care Physician:  Tobie Suzzane POUR, MD Primary GI:  Dr. Cindie  Chief Complaint  Patient presents with   New Patient (Initial Visit)    Pt here to discuss procedure report    HPI:   Betty Dawson is a 68 y.o. female who presents to clinic today for procedure follow-up visit.  Underwent colonoscopy 07/09/2023 for colon cancer screening purposes.  Internal hemorrhoids, normal terminal ileum, localized area of mild inflammation in the ascending colon, biopsies taken, pathology below.  Otherwise unremarkable.   A. COLON, ASCENDING, BIOPSY:  -  Colonic mucosa with mild architectural disarray and focal  cryptitis/mild activity with scattered lymphoid aggregates.   Note: It is noted the patient has a history of polyps and that there was  ascending colon inflammation.  The above findings are nonspecific but  the mild architectural disarray (predominantly crypt dropout and mild  crypt distortion/very minor crypt branching as well as the presence of  activity raises the possibility of inflammatory bowel disease; however,  the findings are not pathognomonic.  Other etiologies could include a  self-limited colitis amongst others.  Clinical/endoscopic correlation  recommended.   Patient denies any diarrhea.  No rectal bleeding.  No fecal urgency.  No mucus in her stool.  No rectal pain or discomfort.  Does have intermittent abdominal pain which she describes as mild to moderate in her periumbilical area.  Occurs 1-2 times a week depending on what she eats.  Does have intermittent constipation.  Notes she fluctuates between [facility] stool 1 and 4.  Occasional straining.  Previous daily user of BC powder 2 times a day on average.  States she has cut back significantly in this regard.  Taking Tylenol .  Denies any family history of inflammatory bowel disease.  Past Medical History:  Diagnosis Date   Anemia    Arthritis    OA multiple joints    Heart murmur    Heel spur    Hypertension    Varicose veins     Past Surgical History:  Procedure Laterality Date   ABDOMINAL HYSTERECTOMY     endometriosis   BREAST BIOPSY Right 2017   HYALINIZED FIBROADENOMA WITH CALCIFICATIONS/   COLONOSCOPY  02/21/2013   DOQ:Dpwhoz polyp measuring 6 mm in size was found in the distal/transverse colon; polypectomy was performed using snare cautery/The colon was redundant/The colon mucosa was otherwise normal/Normal mucosa in the terminal ileum   COLONOSCOPY N/A 02/21/2013   Procedure: COLONOSCOPY;  Surgeon: Margo LITTIE Haddock, MD;  [facility]: [facility];  Service: Endoscopy;  Laterality: N/A;  10:30 AM   COLONOSCOPY N/A 07/09/2023   Procedure: COLONOSCOPY;  Surgeon: Cindie Carlin POUR, DO;  Location: [facility];  Service: Endoscopy;  Laterality: N/A;  1:45 pm, asa 2   FOOT SURGERY Right    heel spur   SHOULDER ARTHROSCOPY WITH ROTATOR CUFF REPAIR Right 01/30/2016   Procedure: SHOULDER ARTHROSCOPY WITH ACROMIOPLASTY;  Surgeon: Taft FORBES Minerva, MD;  Location: [facility];  Service: Orthopedics;  Laterality: Right;  pt knows to arrive at 7:15   SHOULDER OPEN ROTATOR CUFF REPAIR Right 01/30/2016   Procedure: ROTATOR CUFF REPAIR SHOULDER OPEN;  Surgeon: Taft FORBES Minerva, MD;  Location: [facility];  Service: Orthopedics;  Laterality: Right;    Current Outpatient Medications  Medication Sig Dispense Refill   acetaminophen  (TYLENOL ) 500 MG tablet Take 1,000 mg by mouth every 6 (six) hours as needed (pain).      albuterol  (VENTOLIN   HFA) 108 (90 Base) MCG/ACT inhaler Inhale 2 puffs into the lungs every 4 (four) hours as needed for wheezing or shortness of breath. 18 g 0   amLODipine  (NORVASC ) 10 MG tablet Take 1 tablet (10 mg total) by mouth daily. DISCONTINUE AMLODIPINE  5 MG PRESCRIPTION. 90 tablet 1   cyclobenzaprine  (FLEXERIL ) 10 MG tablet TAKE 1 TABLET BY MOUTH TWICE A DAY AS NEEDED FOR MUSCLE SPASM (Patient taking differently: TAKE 1 TABLET BY MOUTH TWICE  A DAY AS NEEDED FOR MUSCLE SPASM) 30 tablet 1   Ferrous Sulfate  Dried 143 (45 Fe) MG TBCR Take 1 tablet by mouth daily. (Patient taking differently: Take 1 tablet by mouth daily.) 30 tablet 3   gabapentin  (NEURONTIN ) 300 MG capsule Take 1 capsule (300 mg total) by mouth at bedtime. (Patient taking differently: Take 1 capsule (300 mg total) by mouth at bedtime.) 30 capsule 5   hydrochlorothiazide  (HYDRODIURIL ) 25 MG tablet Take 1 tablet (25 mg total) by mouth daily. 90 tablet 1   ibuprofen  (ADVIL ,MOTRIN ) 800 MG tablet Take 1 tablet (800 mg total) by mouth every 8 (eight) hours as needed. 90 tablet 5   SPIKEVAX syringe      No current facility-administered medications for this visit.    Allergies as of 07/29/2023 - Review Complete 07/29/2023  Allergen Reaction Noted   Lisinopril Swelling 04/25/2013    Family History  Problem Relation Age of Onset   Hypertension Mother    Cancer Mother    Diabetes Sister    Hyperlipidemia Sister    Hypertension Sister    Diabetes Brother    Hypertension Brother     Social History   Socioeconomic History   Marital status: Single    Spouse name: Not on file   Number of children: Not on file   Years of education: Not on file   Highest education level: Not on file  Occupational History   Not on file  Tobacco Use   Smoking status: Never   Smokeless tobacco: Never  Vaping Use   Vaping status: Never Used  Substance and Sexual Activity   Alcohol use: No   Drug use: No   Sexual activity: Not Currently    Birth control/protection: Surgical  Other Topics Concern   Not on file  Social History Narrative   Not on file   Social Drivers of Health   Financial Resource Strain: Low Risk  (11/02/2022)   Overall Financial Resource Strain (CARDIA)    Difficulty of Paying Living Expenses: Not hard at all  Food Insecurity: No Food Insecurity (11/02/2022)   Hunger Vital Sign    Worried About Running Out of Food in the Last Year: Never true    Ran Out of  Food in the Last Year: Never true  Transportation Needs: No Transportation Needs (11/02/2022)   PRAPARE - Administrator, Civil Service (Medical): No    Lack of Transportation (Non-Medical): No  Physical Activity: Insufficiently Active (11/02/2022)   Exercise Vital Sign    Days of Exercise per Week: 3 days    Minutes of Exercise per Session: 30 min  Stress: No Stress Concern Present (11/02/2022)   [employer] of Occupational Health - Occupational Stress Questionnaire    Feeling of Stress : Not at all  Social Connections: Socially Isolated (11/02/2022)   Social Connection and Isolation Panel    Frequency of Communication with Friends and Family: More than three times a week    Frequency of Social Gatherings with Friends and Family:  More than three times a week    Attends Religious Services: Never    Active Member of Clubs or Organizations: No    Attends Banker Meetings: Never    Marital Status: Widowed    Subjective: Review of Systems  Constitutional:  Negative for chills and fever.  HENT:  Negative for congestion and hearing loss.   Eyes:  Negative for blurred vision and double vision.  Respiratory:  Negative for cough and shortness of breath.   Cardiovascular:  Negative for chest pain and palpitations.  Gastrointestinal:  Positive for abdominal pain and constipation. Negative for blood in stool, diarrhea, heartburn, melena and vomiting.  Genitourinary:  Negative for dysuria and urgency.  Musculoskeletal:  Negative for joint pain and myalgias.  Skin:  Negative for itching and rash.  Neurological:  Negative for dizziness and headaches.  Psychiatric/Behavioral:  Negative for depression. The patient is not nervous/anxious.      Objective: BP (!) 143/76   Pulse 65   Temp 98.6 F (37 C)   Ht 5' 8 (1.727 m)   Wt 188 lb 9.6 oz (85.5 kg)   BMI 28.68 kg/m  Physical Exam Constitutional:      Appearance: Normal appearance.  HENT:     Head:  Normocephalic and atraumatic.  Eyes:     Extraocular Movements: Extraocular movements intact.     Conjunctiva/sclera: Conjunctivae normal.  Cardiovascular:     Rate and Rhythm: Normal rate.     Comments: soft murmur heard Pulmonary:     Effort: Pulmonary effort is normal.     Breath sounds: Normal breath sounds.  Abdominal:     General: Bowel sounds are normal.     Palpations: Abdomen is soft.  Musculoskeletal:        General: No swelling. Normal range of motion.     Cervical back: Normal range of motion and neck supple.  Skin:    General: Skin is warm and dry.     Coloration: Skin is not jaundiced.  Neurological:     General: No focal deficit present.     Mental Status: She is alert and oriented to person, place, and time.  Psychiatric:        Mood and Affect: Mood normal.        Behavior: Behavior normal.      Assessment: *Colitis-etiology unclear *Chronic constipation *Abdominal pain-intermittent, mild  Plan: Discussed colonoscopy and biopsy results in depth with patient today.  Possibly underlying inflammatory bowel disease though patient appears quite asymptomatic from this standpoint.  Does have intermittent abdominal pain depending on what she eats though this occurs 1-2 times per week.  Will check fecal calprotectin today.  Check IBD panel.  Patient counseled to avoid NSAIDs, previous BC powder user.  Can consider repeat colonoscopy in 6 to 12 months for reevaluation.  Hold off for now.  For patient's constipation, I recommended taking MiraLAX 1 capful daily.  If this is not adequate then increase to 2 capfuls daily.  If this is still not adequate then add on once daily Dulcolax.  Encouraged to drink at least 6 glasses of water  daily.  Follow-up in 2 to 3 months.  07/29/2023 11:32 AM   Disclaimer: This note was dictated with voice recognition software. Similar sounding words can inadvertently be transcribed and may not be corrected upon review.

## 2023-07-29 NOTE — Patient Instructions (Signed)
 I am going to check a blood test at [facility] as well as stool testing to check for underlying inflammatory bowel disease.  For your constipation, I want you to start taking over the counter MiraLAX 1 capful daily.  If this does not adequately control your constipation, I would increase to 2 capfuls daily.  If this is still not adequate, then I would add on once daily Dulcolax (bisacodyl) tablet.   Be sure to drink at least 4 to 6 glasses of water  daily.   Follow-up in 2 to 3 months.  It was very nice seeing both you today.  Dr. Cindie

## 2023-08-02 DIAGNOSIS — K529 Noninfective gastroenteritis and colitis, unspecified: Secondary | ICD-10-CM | POA: Diagnosis not present

## 2023-08-04 LAB — IBD EXPANDED PANEL
ACCA: 32 U (ref 0–90)
ALCA: 35 U (ref 0–60)
AMCA: 25 U (ref 0–100)
Atypical pANCA: NEGATIVE
gASCA: 31 U (ref 0–50)

## 2023-08-04 LAB — CALPROTECTIN, FECAL: Calprotectin, Fecal: 203 ug/g — AB (ref 0–120)

## 2023-08-06 DIAGNOSIS — R7303 Prediabetes: Secondary | ICD-10-CM | POA: Diagnosis not present

## 2023-08-06 DIAGNOSIS — E663 Overweight: Secondary | ICD-10-CM | POA: Diagnosis not present

## 2023-08-06 DIAGNOSIS — Z008 Encounter for other general examination: Secondary | ICD-10-CM | POA: Diagnosis not present

## 2023-08-06 DIAGNOSIS — Z6827 Body mass index (BMI) 27.0-27.9, adult: Secondary | ICD-10-CM | POA: Diagnosis not present

## 2023-08-06 DIAGNOSIS — I10 Essential (primary) hypertension: Secondary | ICD-10-CM | POA: Diagnosis not present

## 2023-08-06 DIAGNOSIS — E785 Hyperlipidemia, unspecified: Secondary | ICD-10-CM | POA: Diagnosis not present

## 2023-10-18 ENCOUNTER — Ambulatory Visit: Admitting: Internal Medicine

## 2023-10-18 ENCOUNTER — Encounter: Payer: Self-pay | Admitting: Internal Medicine

## 2023-10-18 VITALS — BP 128/76 | HR 64 | Ht 68.0 in | Wt 189.6 lb

## 2023-10-18 DIAGNOSIS — Z1231 Encounter for screening mammogram for malignant neoplasm of breast: Secondary | ICD-10-CM

## 2023-10-18 DIAGNOSIS — I10 Essential (primary) hypertension: Secondary | ICD-10-CM

## 2023-10-18 DIAGNOSIS — Z23 Encounter for immunization: Secondary | ICD-10-CM | POA: Diagnosis not present

## 2023-10-18 DIAGNOSIS — Z0001 Encounter for general adult medical examination with abnormal findings: Secondary | ICD-10-CM

## 2023-10-18 DIAGNOSIS — M51362 Other intervertebral disc degeneration, lumbar region with discogenic back pain and lower extremity pain: Secondary | ICD-10-CM | POA: Diagnosis not present

## 2023-10-18 DIAGNOSIS — I83893 Varicose veins of bilateral lower extremities with other complications: Secondary | ICD-10-CM

## 2023-10-18 DIAGNOSIS — E782 Mixed hyperlipidemia: Secondary | ICD-10-CM | POA: Diagnosis not present

## 2023-10-18 DIAGNOSIS — R7303 Prediabetes: Secondary | ICD-10-CM

## 2023-10-18 DIAGNOSIS — E559 Vitamin D deficiency, unspecified: Secondary | ICD-10-CM

## 2023-10-18 MED ORDER — SPIKEVAX 50 MCG/0.5ML IM SUSY: 0.5000 mL | PREFILLED_SYRINGE | Freq: Once | INTRAMUSCULAR | 0 refills | Status: AC

## 2023-10-18 MED ORDER — AMLODIPINE BESYLATE 10 MG PO TABS: 10.0000 mg | ORAL_TABLET | Freq: Every day | ORAL | 1 refills | Status: AC

## 2023-10-18 MED ORDER — GABAPENTIN 300 MG PO CAPS: 300.0000 mg | ORAL_CAPSULE | Freq: Every day | ORAL | 5 refills | Status: AC

## 2023-10-18 MED ORDER — HYDROCHLOROTHIAZIDE 25 MG PO TABS: 25.0000 mg | ORAL_TABLET | Freq: Every day | ORAL | 1 refills | Status: AC

## 2023-10-18 NOTE — Progress Notes (Signed)
 Established Patient Office Visit  Subjective:  Patient ID: Betty Dawson, female    DOB: [birth year]  Age: 68 y.o. MRN: [id number]  CC:  Chief Complaint  Patient presents with   Annual Exam    Cpe and 6 month f/u     HPI Betty Dawson is a 68 y.o. female with past medical history of HTN, OA, DDD of lumbar spine and iron deficiency anemia who presents for f/u of her chronic medical conditions.  HTN: BP is well-controlled. Takes amlodipine  regularly.  She takes amlodipine  10 mg once daily and HCTZ 25 mg once daily. Patient denies headache, dizziness, chest pain, dyspnea or palpitations.  Chronic low back pain: She has chronic, intermittent low back pain, radiating to b/l LE with numbness of left foot, worse with movement and walking and better with rest.  She has done physical therapy for low back pain and LE weakness, which has slowly improved her symptoms. She has seen spine surgery for DDD of lumbar spine and was advised PT.  She denies any recent fall or injury.    Past Medical History:  Diagnosis Date   Anemia    Arthritis    OA multiple joints   Heart murmur    Heel spur    Hypertension    Varicose veins     Past Surgical History:  Procedure Laterality Date   ABDOMINAL HYSTERECTOMY     endometriosis   BREAST BIOPSY Right 2017   HYALINIZED FIBROADENOMA WITH CALCIFICATIONS/   COLONOSCOPY  02/21/2013   DOQ:Dpwhoz polyp measuring 6 mm in size was found in the distal/transverse colon; polypectomy was performed using snare cautery/The colon was redundant/The colon mucosa was otherwise normal/Normal mucosa in the terminal ileum   COLONOSCOPY N/A 02/21/2013   Procedure: COLONOSCOPY;  Surgeon: Margo LITTIE Haddock, MD;  [facility];  Service: Endoscopy;  Laterality: N/A;  10:30 AM   COLONOSCOPY N/A 07/09/2023   Procedure: COLONOSCOPY;  Surgeon: Cindie Carlin POUR, DO;  [facility]: [facility];  Service: Endoscopy;  Laterality: N/A;  1:45 pm, asa 2   FOOT SURGERY Right     heel spur   SHOULDER ARTHROSCOPY WITH ROTATOR CUFF REPAIR Right 01/30/2016   Procedure: SHOULDER ARTHROSCOPY WITH ACROMIOPLASTY;  Surgeon: Taft FORBES Minerva, MD;  Location: [facility];  Service: Orthopedics;  Laterality: Right;  pt knows to arrive at 7:15   SHOULDER OPEN ROTATOR CUFF REPAIR Right 01/30/2016   Procedure: ROTATOR CUFF REPAIR SHOULDER OPEN;  Surgeon: Taft FORBES Minerva, MD;  Location: [facility];  Service: Orthopedics;  Laterality: Right;    Family History  Problem Relation Age of Onset   Hypertension Mother    Cancer Mother    Diabetes Sister    Hyperlipidemia Sister    Hypertension Sister    Diabetes Brother    Hypertension Brother     Social History   Socioeconomic History   Marital status: Single    Spouse name: Not on file   Number of children: Not on file   Years of education: Not on file   Highest education level: Not on file  Occupational History   Not on file  Tobacco Use   Smoking status: Never   Smokeless tobacco: Never  Vaping Use   Vaping status: Never Used  Substance and Sexual Activity   Alcohol use: No   Drug use: No   Sexual activity: Not Currently    Birth control/protection: Surgical  Other Topics Concern   Not on file  Social  History Narrative   Not on file   Social Drivers of Health   Financial Resource Strain: Low Risk  (11/02/2022)   Overall Financial Resource Strain (CARDIA)    Difficulty of Paying Living Expenses: Not hard at all  Food Insecurity: No Food Insecurity (11/02/2022)   Hunger Vital Sign    Worried About Running Out of Food in the Last Year: Never true    Ran Out of Food in the Last Year: Never true  Transportation Needs: No Transportation Needs (11/02/2022)   PRAPARE - Administrator, Civil Service (Medical): No    Lack of Transportation (Non-Medical): No  Physical Activity: Insufficiently Active (11/02/2022)   Exercise Vital Sign    Days of Exercise per Week: 3 days    Minutes of Exercise per Session: 30  min  Stress: No Stress Concern Present (11/02/2022)   Betty Dawson of Occupational Health - Occupational Stress Questionnaire    Feeling of Stress : Not at all  Social Connections: Socially Isolated (11/02/2022)   Social Connection and Isolation Panel    Frequency of Communication with Friends and Family: More than three times a week    Frequency of Social Gatherings with Friends and Family: More than three times a week    Attends Religious Services: Never    Database administrator or Organizations: No    Attends Banker Meetings: Never    Marital Status: Widowed  Intimate Partner Violence: Not At Risk (11/02/2022)   Humiliation, Afraid, Rape, and Kick questionnaire    Fear of Current or Ex-Partner: No    Emotionally Abused: No    Physically Abused: No    Sexually Abused: No    Outpatient Medications Prior to Visit  Medication Sig Dispense Refill   acetaminophen  (TYLENOL ) 500 MG tablet Take 1,000 mg by mouth every 6 (six) hours as needed (pain).      albuterol  (VENTOLIN  HFA) 108 (90 Base) MCG/ACT inhaler Inhale 2 puffs into the lungs every 4 (four) hours as needed for wheezing or shortness of breath. 18 g 0   cyclobenzaprine  (FLEXERIL ) 10 MG tablet TAKE 1 TABLET BY MOUTH TWICE A DAY AS NEEDED FOR MUSCLE SPASM (Patient taking differently: TAKE 1 TABLET BY MOUTH TWICE A DAY AS NEEDED FOR MUSCLE SPASM) 30 tablet 1   Ferrous Sulfate  Dried 143 (45 Fe) MG TBCR Take 1 tablet by mouth daily. (Patient taking differently: Take 1 tablet by mouth daily.) 30 tablet 3   ibuprofen  (ADVIL ,MOTRIN ) 800 MG tablet Take 1 tablet (800 mg total) by mouth every 8 (eight) hours as needed. 90 tablet 5   amLODipine  (NORVASC ) 10 MG tablet Take 1 tablet (10 mg total) by mouth daily. DISCONTINUE AMLODIPINE  5 MG PRESCRIPTION. 90 tablet 1   gabapentin  (NEURONTIN ) 300 MG capsule Take 1 capsule (300 mg total) by mouth at bedtime. (Patient taking differently: Take 1 capsule (300 mg total) by mouth at  bedtime.) 30 capsule 5   hydrochlorothiazide  (HYDRODIURIL ) 25 MG tablet Take 1 tablet (25 mg total) by mouth daily. 90 tablet 1   SPIKEVAX  syringe      No facility-administered medications prior to visit.    Allergies  Allergen Reactions   Lisinopril Swelling    ROS Review of Systems  Constitutional:  Negative for chills and fever.  HENT:  Negative for congestion, sinus pressure, sinus pain and sore throat.   Eyes:  Negative for pain and discharge.  Respiratory:  Negative for cough and shortness of breath.   Cardiovascular:  Negative for chest pain and palpitations.  Gastrointestinal:  Negative for abdominal pain, constipation, diarrhea, nausea and vomiting.  Endocrine: Negative for polydipsia and polyuria.  Genitourinary:  Negative for dysuria and hematuria.  Musculoskeletal:  Positive for arthralgias and back pain. Negative for neck pain and neck stiffness.  Skin:  Negative for rash.  Neurological:  Negative for dizziness and weakness.  Psychiatric/Behavioral:  Negative for agitation and behavioral problems.       Objective:    Physical Exam Vitals reviewed.  Constitutional:      General: She is not in acute distress.    Appearance: She is not diaphoretic.  HENT:     Head: Normocephalic and atraumatic.     Nose: Nose normal.     Mouth/Throat:     Mouth: Mucous membranes are moist.  Eyes:     General: No scleral icterus.    Extraocular Movements: Extraocular movements intact.  Neck:     Vascular: No carotid bruit.  Cardiovascular:     Rate and Rhythm: Normal rate and regular rhythm.     Heart sounds: Normal heart sounds. No murmur heard. Pulmonary:     Breath sounds: Normal breath sounds. No wheezing or rales.  Abdominal:     Palpations: Abdomen is soft.     Tenderness: There is no abdominal tenderness.  Musculoskeletal:     Cervical back: Neck supple. No tenderness.     Right lower leg: No edema.     Left lower leg: No edema.  Skin:    General: Skin is  warm.     Findings: No rash.  Neurological:     General: No focal deficit present.     Mental Status: She is alert and oriented to person, place, and time.     Cranial Nerves: No cranial nerve deficit.     Sensory: No sensory deficit.     Motor: No weakness.  Psychiatric:        Mood and Affect: Mood normal.        Behavior: Behavior normal.     BP 128/76   Pulse 64   Ht 5' 8 (1.727 m)   Wt 189 lb 9.6 oz (86 kg)   SpO2 99%   BMI 28.83 kg/m  Wt Readings from Last 3 Encounters:  10/18/23 189 lb 9.6 oz (86 kg)  07/29/23 188 lb 9.6 oz (85.5 kg)  07/06/23 182 lb (82.6 kg)    Lab Results  Component Value Date   TSH 0.783 10/09/2022   Lab Results  Component Value Date   WBC 4.8 10/09/2022   HGB 12.1 10/09/2022   HCT 37.3 10/09/2022   MCV 86 10/09/2022   PLT 318 10/09/2022   Lab Results  Component Value Date   NA 141 06/18/2023   K 4.2 06/18/2023   CO2 26 06/18/2023   GLUCOSE 97 06/18/2023   BUN 14 06/18/2023   CREATININE 0.51 (L) 06/18/2023   BILITOT 0.3 10/09/2022   ALKPHOS 107 10/09/2022   AST 15 10/09/2022   ALT 8 10/09/2022   PROT 7.4 10/09/2022   ALBUMIN 4.2 10/09/2022   CALCIUM 9.6 06/18/2023   ANIONGAP 7 01/28/2016   EGFR 101 06/18/2023   Lab Results  Component Value Date   CHOL 147 10/09/2022   Lab Results  Component Value Date   HDL 63 10/09/2022   Lab Results  Component Value Date   LDLCALC 73 10/09/2022   Lab Results  Component Value Date   TRIG 51 10/09/2022   Lab  Results  Component Value Date   CHOLHDL 2.3 10/09/2022   Lab Results  Component Value Date   HGBA1C 5.7 (H) 10/09/2022      Assessment & Plan:   Problem List Items Addressed This Visit       Cardiovascular and Mediastinum   HTN (hypertension)   BP Readings from Last 1 Encounters:  10/18/23 128/76   Well-controlled with Amlodipine  10 mg QD and HCTZ 25 mg QD Counseled for compliance with the medications Advised DASH diet and moderate exercise/walking, at  least 150 mins/week      Relevant Medications   amLODipine  (NORVASC ) 10 MG tablet   hydrochlorothiazide  (HYDRODIURIL ) 25 MG tablet   Other Relevant Orders   TSH   CMP14+EGFR   CBC with Differential/Platelet   Varicose veins of leg with edema, bilateral   Had chronic LE swelling, currently better Advised to use compression stockings Leg elevation as tolerated Has seen vascular surgery in the past, recommended conservative management      Relevant Medications   amLODipine  (NORVASC ) 10 MG tablet   hydrochlorothiazide  (HYDRODIURIL ) 25 MG tablet     Musculoskeletal and Integument   DDD (degenerative disc disease), lumbar   Chronic low back pain radiating to left LE On Gabapentin  and PRN Tylenol , but does not take Gabapentin  regularly Flexeril  10 mg PRN for muscle spasms MRI lumbar spine reviewed Has seen spine surgeon Has done physical therapy      Relevant Medications   gabapentin  (NEURONTIN ) 300 MG capsule     Other   Encounter for general adult medical examination with abnormal findings - Primary   Physical exam as documented. Counseling done  re healthy lifestyle involving commitment to 150 minutes exercise per week, heart healthy diet, and attaining healthy weight.The importance of adequate sleep also discussed. Immunization and cancer screening needs are specifically addressed at this visit.      Prediabetes   Lab Results  Component Value Date   HGBA1C 5.7 (H) 10/09/2022   Advised to follow DASH diet      Relevant Orders   Hemoglobin A1c   CMP14+EGFR   Other Visit Diagnoses       Breast cancer screening by mammogram       Relevant Orders   MM 3D SCREENING MAMMOGRAM BILATERAL BREAST     Mixed hyperlipidemia       Relevant Medications   amLODipine  (NORVASC ) 10 MG tablet   hydrochlorothiazide  (HYDRODIURIL ) 25 MG tablet   Other Relevant Orders   Lipid panel     Vitamin D  deficiency       Relevant Orders   VITAMIN D  25 Hydroxy (Vit-D Deficiency,  Fractures)     Need for COVID-19 vaccine       Relevant Medications   COVID-19 mRNA vaccine (SPIKEVAX ) syringe     Encounter for immunization       Relevant Orders   Flu vaccine HIGH DOSE PF(Fluzone Trivalent) (Completed)       Meds ordered this encounter  Medications   COVID-19 mRNA vaccine (SPIKEVAX ) syringe    Sig: Inject 0.5 mLs into the muscle once for 1 dose. Okay to administer Pfizer vaccine if Moderna is not available.    Dispense:  0.5 mL    Refill:  0   amLODipine  (NORVASC ) 10 MG tablet    Sig: Take 1 tablet (10 mg total) by mouth daily.    Dispense:  90 tablet    Refill:  1   hydrochlorothiazide  (HYDRODIURIL ) 25 MG tablet    Sig:  Take 1 tablet (25 mg total) by mouth daily.    Dispense:  90 tablet    Refill:  1   gabapentin  (NEURONTIN ) 300 MG capsule    Sig: Take 1 capsule (300 mg total) by mouth at bedtime.    Dispense:  30 capsule    Refill:  5    Follow-up: Return in about 6 months (around 04/16/2024) for HTN.    Suzzane MARLA Blanch, MD

## 2023-10-18 NOTE — Patient Instructions (Addendum)
 Please schedule Mammogram for November.  Please continue to take medications as prescribed.  Please continue to follow low salt diet and perform moderate exercise/walking as tolerated.  Please consider getting Tdap vaccine at local pharmacy.

## 2023-10-18 NOTE — Assessment & Plan Note (Signed)
 Lab Results  Component Value Date   HGBA1C 5.7 (H) 10/09/2022   Advised to follow DASH diet

## 2023-10-18 NOTE — Assessment & Plan Note (Signed)
Had chronic LE swelling, currently better Advised to use compression stockings Leg elevation as tolerated Has seen vascular surgery in the past, recommended conservative management

## 2023-10-18 NOTE — Assessment & Plan Note (Signed)
 Physical exam as documented. Counseling done  re healthy lifestyle involving commitment to 150 minutes exercise per week, heart healthy diet, and attaining healthy weight.The importance of adequate sleep also discussed. Immunization and cancer screening needs are specifically addressed at this visit.

## 2023-10-18 NOTE — Assessment & Plan Note (Addendum)
 BP Readings from Last 1 Encounters:  10/18/23 128/76   Well-controlled with Amlodipine  10 mg QD and HCTZ 25 mg QD Counseled for compliance with the medications Advised DASH diet and moderate exercise/walking, at least 150 mins/week

## 2023-10-18 NOTE — Assessment & Plan Note (Signed)
 Chronic low back pain radiating to left LE On Gabapentin and PRN Tylenol, but does not take Gabapentin regularly Flexeril 10 mg PRN for muscle spasms MRI lumbar spine reviewed Has seen spine surgeon Has done physical therapy

## 2023-10-19 ENCOUNTER — Ambulatory Visit: Payer: Self-pay | Admitting: Internal Medicine

## 2023-10-19 LAB — CMP14+EGFR
ALT: 10 IU/L (ref 0–32)
AST: 15 IU/L (ref 0–40)
Albumin: 4.4 g/dL (ref 3.9–4.9)
Alkaline Phosphatase: 104 IU/L (ref 49–135)
BUN/Creatinine Ratio: 20 (ref 12–28)
BUN: 12 mg/dL (ref 8–27)
Bilirubin Total: 0.4 mg/dL (ref 0.0–1.2)
CO2: 33 mmol/L — ABNORMAL HIGH (ref 20–29)
Calcium: 9.6 mg/dL (ref 8.7–10.3)
Chloride: 96 mmol/L (ref 96–106)
Creatinine, Ser: 0.6 mg/dL (ref 0.57–1.00)
Globulin, Total: 3.1 g/dL (ref 1.5–4.5)
Glucose: 102 mg/dL — ABNORMAL HIGH (ref 70–99)
Potassium: 4 mmol/L (ref 3.5–5.2)
Sodium: 138 mmol/L (ref 134–144)
Total Protein: 7.5 g/dL (ref 6.0–8.5)
eGFR: 97 mL/min/1.73 (ref >59)

## 2023-10-19 LAB — CBC WITH DIFFERENTIAL/PLATELET
Basophils Absolute: 0 x10E3/uL (ref 0.0–0.2)
Basos: 1 %
EOS (ABSOLUTE): 0.1 x10E3/uL (ref 0.0–0.4)
Eos: 1 %
Hematocrit: 38.3 % (ref 34.0–46.6)
Hemoglobin: 12.1 g/dL (ref 11.1–15.9)
Immature Grans (Abs): 0 x10E3/uL (ref 0.0–0.1)
Immature Granulocytes: 0 %
Lymphocytes Absolute: 1.9 x10E3/uL (ref 0.7–3.1)
Lymphs: 29 %
MCH: 27.9 pg (ref 26.6–33.0)
MCHC: 31.6 g/dL (ref 31.5–35.7)
MCV: 89 fL (ref 79–97)
Monocytes Absolute: 0.5 x10E3/uL (ref 0.1–0.9)
Monocytes: 7 %
Neutrophils Absolute: 3.9 x10E3/uL (ref 1.4–7.0)
Neutrophils: 62 %
Platelets: 339 x10E3/uL (ref 150–450)
RBC: 4.33 x10E6/uL (ref 3.77–5.28)
RDW: 12.5 % (ref 11.7–15.4)
WBC: 6.3 x10E3/uL (ref 3.4–10.8)

## 2023-10-19 LAB — HEMOGLOBIN A1C
Est. average glucose Bld gHb Est-mCnc: 114 mg/dL
Hgb A1c MFr Bld: 5.6 % (ref 4.8–5.6)

## 2023-10-19 LAB — LIPID PANEL
Chol/HDL Ratio: 2.2 ratio (ref 0.0–4.4)
Cholesterol, Total: 166 mg/dL (ref 100–199)
HDL: 77 mg/dL (ref >39)
LDL Chol Calc (NIH): 80 mg/dL (ref 0–99)
Triglycerides: 44 mg/dL (ref 0–149)
VLDL Cholesterol Cal: 9 mg/dL (ref 5–40)

## 2023-10-19 LAB — VITAMIN D 25 HYDROXY (VIT D DEFICIENCY, FRACTURES): Vit D, 25-Hydroxy: 25.8 ng/mL — ABNORMAL LOW (ref 30.0–100.0)

## 2023-10-19 LAB — TSH: TSH: 1.03 u[IU]/mL (ref 0.450–4.500)

## 2023-10-31 NOTE — Progress Notes (Unsigned)
 GI Office Note    Referring Provider: Tobie Suzzane POUR, MD Primary Care Physician:  Tobie Suzzane POUR, MD Primary Gastroenterologist: Carlin POUR. Cindie, DO  Date:  11/01/2023  ID:  Betty Dawson, DOB 1955-05-13, MRN [id number]   Chief Complaint   Chief Complaint  Patient presents with   Follow-up    Follow up. No problems    History of Present Illness  Betty Dawson is a 68 y.o. female with a history of HTN, heart murmur, anemia, osteoarthritis presenting today for follow-up of colitis and intermittent constipation.  Colonoscopy January 2015 by Dr. Harvey: - 6 mm polyp in the transverse colon - Redundant colon - Normal TI and normal colon otherwise - Repeat colonoscopy in 10 years, follow high-fiber diet.  Colonoscopy 07/09/2023: - Non- bleeding internal hemorrhoids.  - The examined portion of the ileum was normal.  - Localized mild inflammation was found in the ascending colon. Biopsied. - Pathology with mild architectural disarray and focal cryptitis/mild activity with scattered lymphoid aggregates. (Predominantly crypt dropout and mild crypt distortion/very minor crypt branching as well as the presence of activity raises possibility of IBD, however findings not pathognomonic, could be self-limited colitis)  Last office visit 07/29/23.  Denied rectal bleeding, diarrhea, urgency, mucus, rectal pain or discomfort.  Did note intermittent abdominal pain described as mild to moderate in the periumbilical region occurring 1-2 times per week pending dietary choices.  Also noted intermittent constipation with having fluctuating Bristol 1-4 stools and occasional straining.  Previous daily user of BC powder 2 times a day on average, has cut back significantly on this.  Denied family history of IBD.  He discussed with her that findings on her pathology were possibly underlying IBD although asymptomatic from this standpoint.  Advised to check IBD panel and fecal calprotectin and counseled on  avoidance of NSAIDs and BC powders.  Consider colonoscopy repeat in 6-12 months for reevaluation potentially.  Recommended MiraLAX 1 capful daily for constipation and increase or decrease as needed.  Fecal calprotectin elevated at 203 with IBD panel not suggestive of IBD  Today:  Discussed the use of AI scribe software for clinical note transcription with the patient, who gave verbal consent to proceed.  She underwent a colonoscopy in June which revealed colitis. Since the procedure, she has experienced no symptoms such as abdominal pain, nausea, vomiting, trouble eating, blood in stool, black stool, or rectal pain, no weight loss, issues with appetite.   She experiences occasional constipation and uses Miralax as needed, approximately every two to three days, to manage her symptoms. She avoids taking it daily due to previous experiences of diarrhea. She does not have a bowel movement every day, sometimes going two to three days without one, and feels nauseated when constipated.  She does not take iron supplements currently, as her recent blood work showed stable hemoglobin levels. She mentions that taking iron worsens her constipation.      Wt Readings from Last 5 Encounters:  11/01/23 190 lb 12.8 oz (86.5 kg)  10/18/23 189 lb 9.6 oz (86 kg)  07/29/23 188 lb 9.6 oz (85.5 kg)  07/06/23 182 lb (82.6 kg)  04/13/23 189 lb 9.6 oz (86 kg)    Current Outpatient Medications  Medication Sig Dispense Refill   acetaminophen  (TYLENOL ) 500 MG tablet Take 1,000 mg by mouth every 6 (six) hours as needed (pain).      albuterol  (VENTOLIN  HFA) 108 (90 Base) MCG/ACT inhaler Inhale 2 puffs into the lungs every 4 (four)  hours as needed for wheezing or shortness of breath. 18 g 0   amLODipine  (NORVASC ) 10 MG tablet Take 1 tablet (10 mg total) by mouth daily. 90 tablet 1   gabapentin  (NEURONTIN ) 300 MG capsule Take 1 capsule (300 mg total) by mouth at bedtime. 30 capsule 5   hydrochlorothiazide  (HYDRODIURIL )  25 MG tablet Take 1 tablet (25 mg total) by mouth daily. 90 tablet 1   ibuprofen  (ADVIL ,MOTRIN ) 800 MG tablet Take 1 tablet (800 mg total) by mouth every 8 (eight) hours as needed. 90 tablet 5   cyclobenzaprine  (FLEXERIL ) 10 MG tablet TAKE 1 TABLET BY MOUTH TWICE A DAY AS NEEDED FOR MUSCLE SPASM (Patient taking differently: TAKE 1 TABLET BY MOUTH TWICE A DAY AS NEEDED FOR MUSCLE SPASM) 30 tablet 1   Ferrous Sulfate  Dried 143 (45 Fe) MG TBCR Take 1 tablet by mouth daily. (Patient taking differently: Take 1 tablet by mouth daily.) 30 tablet 3   No current facility-administered medications for this visit.    Past Medical History:  Diagnosis Date   Anemia    Arthritis    OA multiple joints   Heart murmur    Heel spur    Hypertension    Varicose veins     Past Surgical History:  Procedure Laterality Date   ABDOMINAL HYSTERECTOMY     endometriosis   BREAST BIOPSY Right 2017   HYALINIZED FIBROADENOMA WITH CALCIFICATIONS/   COLONOSCOPY  02/21/2013   DOQ:Dpwhoz polyp measuring 6 mm in size was found in the distal/transverse colon; polypectomy was performed using snare cautery/The colon was redundant/The colon mucosa was otherwise normal/Normal mucosa in the terminal ileum   COLONOSCOPY N/A 02/21/2013   Procedure: COLONOSCOPY;  Surgeon: Margo LITTIE Haddock, MD;  Location: [facility];  Service: Endoscopy;  Laterality: N/A;  10:30 AM   COLONOSCOPY N/A 07/09/2023   Procedure: COLONOSCOPY;  Surgeon: Cindie Carlin POUR, DO;  Location: [facility];  Service: Endoscopy;  Laterality: N/A;  1:45 pm, asa 2   FOOT SURGERY Right    heel spur   SHOULDER ARTHROSCOPY WITH ROTATOR CUFF REPAIR Right 01/30/2016   Procedure: SHOULDER ARTHROSCOPY WITH ACROMIOPLASTY;  Surgeon: Taft FORBES Minerva, MD;  Location: [facility];  Service: Orthopedics;  Laterality: Right;  pt knows to arrive at 7:15   SHOULDER OPEN ROTATOR CUFF REPAIR Right 01/30/2016   Procedure: ROTATOR CUFF REPAIR SHOULDER OPEN;  Surgeon: Taft FORBES Minerva, MD;  Location: [facility];  Service: Orthopedics;  Laterality: Right;    Family History  Problem Relation Age of Onset   Hypertension Mother    Cancer Mother    Diabetes Sister    Hyperlipidemia Sister    Hypertension Sister    Diabetes Brother    Hypertension Brother     Allergies as of 11/01/2023 - Review Complete 11/01/2023  Allergen Reaction Noted   Lisinopril Swelling 04/25/2013    Social History   Socioeconomic History   Marital status: Single    Spouse name: Not on file   Number of children: Not on file   Years of education: Not on file   Highest education level: Not on file  Occupational History   Not on file  Tobacco Use   Smoking status: Never   Smokeless tobacco: Never  Vaping Use   Vaping status: Never Used  Substance and Sexual Activity   Alcohol use: No   Drug use: No   Sexual activity: Not Currently    Birth control/protection: Surgical  Other Topics Concern  Not on file  Social History Narrative   Not on file   Social Drivers of Health   Financial Resource Strain: Low Risk  (11/02/2022)   Overall Financial Resource Strain (CARDIA)    Difficulty of Paying Living Expenses: Not hard at all  Food Insecurity: No Food Insecurity (11/02/2022)   Hunger Vital Sign    Worried About Running Out of Food in the Last Year: Never true    Ran Out of Food in the Last Year: Never true  Transportation Needs: No Transportation Needs (11/02/2022)   PRAPARE - Administrator, Civil Service (Medical): No    Lack of Transportation (Non-Medical): No  Physical Activity: Insufficiently Active (11/02/2022)   Exercise Vital Sign    Days of Exercise per Week: 3 days    Minutes of Exercise per Session: 30 min  Stress: No Stress Concern Present (11/02/2022)   [employer] of Occupational Health - Occupational Stress Questionnaire    Feeling of Stress : Not at all  Social Connections: Socially Isolated (11/02/2022)   Social Connection and Isolation  Panel    Frequency of Communication with Friends and Family: More than three times a week    Frequency of Social Gatherings with Friends and Family: More than three times a week    Attends Religious Services: Never    Database administrator or Organizations: No    Attends Banker Meetings: Never    Marital Status: Widowed     Review of Systems   Gen: Denies fever, chills, anorexia. Denies fatigue, weakness, weight loss.  CV: Denies chest pain, palpitations, syncope, peripheral edema, and claudication. Resp: Denies dyspnea at rest, cough, wheezing, coughing up blood, and pleurisy. GI: See HPI Derm: Denies rash, itching, dry skin Psych: Denies depression, anxiety, memory loss, confusion. No homicidal or suicidal ideation.  Heme: Denies bruising, bleeding, and enlarged lymph nodes.  Physical Exam   BP 125/67 (BP Location: Right Arm, Patient Position: Sitting, Cuff Size: Large)   Pulse 64   Temp 97.6 F (36.4 C) (Temporal)   Ht 5' 8 (1.727 m)   Wt 190 lb 12.8 oz (86.5 kg)   BMI 29.01 kg/m   General:   Alert and oriented. No distress noted. Pleasant and cooperative.  Head:  Normocephalic and atraumatic. Eyes:  Conjuctiva clear without scleral icterus. Abdomen:  +BS, soft, non-tender and non-distended. No rebound or guarding. No HSM or masses noted. Rectal: deferred Msk:  Symmetrical without gross deformities. Normal posture. Neurologic:  Alert and  oriented x4 Psych:  Alert and cooperative. Normal mood and affect.  Assessment & Plan  Betty Dawson is a 68 y.o. female presenting today with no complaints but does have constipation.      Colitis, unspecified Colitis identified in the ascending colon with nonspecific colitis and architectural distortion during a colonoscopy in June, currently asymptomatic with no abdominal pain, nausea, vomiting, or rectal bleeding. Previous use of BC powders may have contributed to inflammation. Differential diagnosis includes  asymptomatic ulcerative colitis or Crohn's disease, NASID induced colitis. Fecal calprotectin was high in July, but blood tests for inflammatory bowel disease markers were negative. - Consider repeat colonoscopy in 3-6 months to assess inflammation and perform biopsies - will send triage questionnaire then schedule. (No office visit required) - Monitor for symptoms such as blood in stool, black stool, or abdominal pain, and consider earlier intervention if these occur.  Constipation Intermittent constipation managed with Miralax 1 capful as needed. Concerns about diarrhea when taken  daily. Bowel movements occur every 2-3 days. Nausea and fullness may be related to constipation or mild acid reflux. - Adjust Miralax dosage to a full capful every other day or half a capful daily to improve regularity without causing diarrhea. - Monitor for persistent nausea and consider starting Pepcid for atypical acid reflux if symptoms persist. - Remove iron from medication list as hemoglobin levels are stable and iron may worsen constipation.      Follow up   Follow up TBD after next colonoscopy.   Triage questionnaire for colonoscopy in 3-4 months.     Charmaine Melia, MSN, FNP-BC, AGACNP-BC [facility]

## 2023-11-01 ENCOUNTER — Ambulatory Visit: Admitting: Gastroenterology

## 2023-11-01 ENCOUNTER — Encounter: Payer: Self-pay | Admitting: Gastroenterology

## 2023-11-01 VITALS — BP 125/67 | HR 64 | Temp 97.6°F | Ht 68.0 in | Wt 190.8 lb

## 2023-11-01 DIAGNOSIS — K5900 Constipation, unspecified: Secondary | ICD-10-CM

## 2023-11-01 DIAGNOSIS — K529 Noninfective gastroenteritis and colitis, unspecified: Secondary | ICD-10-CM | POA: Diagnosis not present

## 2023-11-01 NOTE — Patient Instructions (Addendum)
 For constipation: Continue MiraLAX as needed although you can adjust this to taking 1 capful every other day or a half a capful daily.  Please try doing 1 of these things to help with bowel regularity.  In regards to the prior colitis on your prior colonoscopy we will send you a questionnaire in 3 to 4 months, after you have filled this out and we receive it we will then triage you and get you scheduled for repeat colonoscopy to assess for healing of this area and further biopsy.  Continue to avoid any NSAIDs going forward (Aleve, Advil , ibuprofen , meloxicam , including BC and Goody powders)  Please contact the office if you begin developing any abdominal pain, black/tarry stools, bright red blood in your stool, or mucus in your stools or any other changes in bowel habits including excessive diarrhea without laxatives.  It was a pleasure to see you today. I want to create trusting relationships with patients. If you receive a survey regarding your visit,  I greatly appreciate you taking time to fill this out on paper or through your MyChart. I value your feedback.  Charmaine Melia, MSN, FNP-BC, AGACNP-BC [facility]

## 2023-11-03 ENCOUNTER — Ambulatory Visit: Payer: Medicare PPO

## 2023-11-03 VITALS — Ht 68.0 in | Wt 190.0 lb

## 2023-11-03 DIAGNOSIS — Z780 Asymptomatic menopausal state: Secondary | ICD-10-CM

## 2023-11-03 DIAGNOSIS — Z0000 Encounter for general adult medical examination without abnormal findings: Secondary | ICD-10-CM | POA: Diagnosis not present

## 2023-11-03 NOTE — Progress Notes (Signed)
 Bone density ordered for patient. She was provided with the phone number to [facility] to schedule that appointment. She verbalized understanding and is in agreement with treatment plan.  Subjective:   Betty Dawson is a 68 y.o. who presents for a Medicare Wellness preventive visit.  As a reminder, Annual Wellness Visits don't include a physical exam, and some assessments may be limited, especially if this visit is performed virtually. We may recommend an in-person follow-up visit with your provider if needed.  Visit Complete: Virtual I connected with  Naomie VEAR Bloodgood on 11/03/23 by a audio enabled telemedicine application and verified that I am speaking with the correct person using two identifiers.  Patient Location: Home  Provider Location: Home Office  I discussed the limitations of evaluation and management by telemedicine. The patient expressed understanding and agreed to proceed.  Vital Signs: Because this visit was a virtual/telehealth visit, some criteria may be missing or patient reported. Any vitals not documented were not able to be obtained and vitals that have been documented are patient reported.  VideoDeclined- This patient declined Librarian, academic. Therefore the visit was completed with audio only.  Persons Participating in Visit: Patient.  AWV Questionnaire: No: Patient Medicare AWV questionnaire was not completed prior to this visit.  Cardiac Risk Factors include: advanced age (>26men, >22 women);hypertension     Objective:    Today's Vitals   11/03/23 0900 11/03/23 0903  Weight: 190 lb (86.2 kg)   Height: 5' 8 (1.727 m)   PainSc:  0-No pain   Body mass index is 28.89 kg/m.     11/03/2023    8:52 AM 07/09/2023   11:52 AM 11/02/2022    9:04 AM 07/01/2021   10:16 AM 04/16/2021   10:41 AM 09/29/2020    9:49 AM 04/06/2019   11:17 AM  Advanced Directives  Does Patient Have a Medical Advance Directive? No No No No No No No   Would patient like information on creating a medical advance directive? No - Patient declined No - Patient declined Yes (MAU/Ambulatory/Procedural Areas - Information given) No - Patient declined No - Patient declined No - Patient declined No - Patient declined    Current Medications (verified) Outpatient Encounter Medications as of 11/03/2023  Medication Sig   acetaminophen  (TYLENOL ) 500 MG tablet Take 1,000 mg by mouth every 6 (six) hours as needed (pain).    albuterol  (VENTOLIN  HFA) 108 (90 Base) MCG/ACT inhaler Inhale 2 puffs into the lungs every 4 (four) hours as needed for wheezing or shortness of breath.   amLODipine  (NORVASC ) 10 MG tablet Take 1 tablet (10 mg total) by mouth daily.   cyclobenzaprine  (FLEXERIL ) 10 MG tablet TAKE 1 TABLET BY MOUTH TWICE A DAY AS NEEDED FOR MUSCLE SPASM (Patient taking differently: TAKE 1 TABLET BY MOUTH TWICE A DAY AS NEEDED FOR MUSCLE SPASM)   gabapentin  (NEURONTIN ) 300 MG capsule Take 1 capsule (300 mg total) by mouth at bedtime.   hydrochlorothiazide  (HYDRODIURIL ) 25 MG tablet Take 1 tablet (25 mg total) by mouth daily.   ibuprofen  (ADVIL ,MOTRIN ) 800 MG tablet Take 1 tablet (800 mg total) by mouth every 8 (eight) hours as needed.   No facility-administered encounter medications on file as of 11/03/2023.    Allergies (verified) Lisinopril   History: Past Medical History:  Diagnosis Date   Anemia    Arthritis    OA multiple joints   Heart murmur    Heel spur    Hypertension  Varicose veins    Past Surgical History:  Procedure Laterality Date   ABDOMINAL HYSTERECTOMY     endometriosis   BREAST BIOPSY Right 2017   HYALINIZED FIBROADENOMA WITH CALCIFICATIONS/   COLONOSCOPY  02/21/2013   DOQ:Dpwhoz polyp measuring 6 mm in size was found in the distal/transverse colon; polypectomy was performed using snare cautery/The colon was redundant/The colon mucosa was otherwise normal/Normal mucosa in the terminal ileum   COLONOSCOPY N/A 02/21/2013    Procedure: COLONOSCOPY;  Surgeon: Margo LITTIE Haddock, MD;  Location: [facility];  Service: Endoscopy;  Laterality: N/A;  10:30 AM   COLONOSCOPY N/A 07/09/2023   Procedure: COLONOSCOPY;  Surgeon: Cindie Carlin POUR, DO;  Location: [facility];  Service: Endoscopy;  Laterality: N/A;  1:45 pm, asa 2   FOOT SURGERY Right    heel spur   SHOULDER ARTHROSCOPY WITH ROTATOR CUFF REPAIR Right 01/30/2016   Procedure: SHOULDER ARTHROSCOPY WITH ACROMIOPLASTY;  Surgeon: Taft FORBES Minerva, MD;  Location: [facility];  Service: Orthopedics;  Laterality: Right;  pt knows to arrive at 7:15   SHOULDER OPEN ROTATOR CUFF REPAIR Right 01/30/2016   Procedure: ROTATOR CUFF REPAIR SHOULDER OPEN;  Surgeon: Taft FORBES Minerva, MD;  Location: [facility];  Service: Orthopedics;  Laterality: Right;   Family History  Problem Relation Age of Onset   Hypertension Mother    Cancer Mother    Diabetes Sister    Hyperlipidemia Sister    Hypertension Sister    Diabetes Brother    Hypertension Brother    Social History   Socioeconomic History   Marital status: Single    Spouse name: Not on file   Number of children: Not on file   Years of education: Not on file   Highest education level: Not on file  Occupational History   Not on file  Tobacco Use   Smoking status: Never   Smokeless tobacco: Never  Vaping Use   Vaping status: Never Used  Substance and Sexual Activity   Alcohol use: No   Drug use: No   Sexual activity: Not Currently    Birth control/protection: Surgical  Other Topics Concern   Not on file  Social History Narrative   Not on file   Social Drivers of Health   Financial Resource Strain: Low Risk  (11/03/2023)   Overall Financial Resource Strain (CARDIA)    Difficulty of Paying Living Expenses: Not hard at all  Food Insecurity: No Food Insecurity (11/03/2023)   Hunger Vital Sign    Worried About Running Out of Food in the Last Year: Never true    Ran Out of Food in the Last Year: Never true   Transportation Needs: No Transportation Needs (11/03/2023)   PRAPARE - Administrator, Civil Service (Medical): No    Lack of Transportation (Non-Medical): No  Physical Activity: Sufficiently Active (11/03/2023)   Exercise Vital Sign    Days of Exercise per Week: 7 days    Minutes of Exercise per Session: 30 min  Stress: No Stress Concern Present (11/03/2023)   [employer] of Occupational Health - Occupational Stress Questionnaire    Feeling of Stress: Not at all  Social Connections: Moderately Isolated (11/03/2023)   Social Connection and Isolation Panel    Frequency of Communication with Friends and Family: More than three times a week    Frequency of Social Gatherings with Friends and Family: More than three times a week    Attends Religious Services: More than 4 times per year  Active Member of Clubs or Organizations: No    Attends Banker Meetings: Never    Marital Status: Widowed    Tobacco Counseling Counseling given: Yes    Clinical Intake:  Pre-visit preparation completed: Yes  Pain : No/denies pain Pain Score: 0-No pain     BMI - recorded: 28.89 Nutritional Status: BMI 25 -29 Overweight Nutritional Risks: None Diabetes: No  Lab Results  Component Value Date   HGBA1C 5.6 10/18/2023   HGBA1C 5.7 (H) 10/09/2022   HGBA1C 5.7 (H) 10/01/2021     How often do you need to have someone help you when you read instructions, pamphlets, or other written materials from your doctor or pharmacy?: 1 - Never  Interpreter Needed?: No  Information entered by :: Simona Rocque W CMA (AAMA)   Activities of Daily Living     11/03/2023    9:10 AM 07/06/2023   10:12 AM  In your present state of health, do you have any difficulty performing the following activities:  Hearing? 0   Vision? 0   Difficulty concentrating or making decisions? 0   Walking or climbing stairs? 0   Dressing or bathing? 0   Doing errands, shopping? 0 0  Preparing Food and  eating ? N   Using the Toilet? N   In the past six months, have you accidently leaked urine? N   Do you have problems with loss of bowel control? N   Managing your Medications? N   Managing your Finances? N   Housekeeping or managing your Housekeeping? N     Patient Care Team: Tobie Suzzane POUR, MD as PCP - General (Internal Medicine) Patty, A. Robynn, MD (Ophthalmology) Kennedy Charmaine CROME, NP as Nurse Practitioner (Gastroenterology) Cindie Carlin POUR, DO as Consulting Physician (Gastroenterology) Darlis Deatrice RAMAN, MD as Consulting Physician (Pain Medicine) Debby Dorn MATSU, MD as Consulting Physician (Neurosurgery)  I have updated your Care Teams any recent Medical Services you may have received from other providers in the past year.     Assessment:   This is a routine wellness examination for Jovonne.  Hearing/Vision screen Hearing Screening - Comments:: Patient denies any hearing difficulties.   Vision Screening - Comments:: Wears rx glasses - up to date with routine eye exams with  [facility] in [place]    Goals Addressed               This Visit's Progress     I want to have more strength (pt-stated)        Patient states she is mostly weak in her back        Depression Screen     11/03/2023    9:12 AM 10/18/2023    9:54 AM 04/13/2023   10:15 AM 11/02/2022    9:03 AM 10/14/2022   10:13 AM 06/10/2022    9:45 AM 04/08/2022   10:18 AM  PHQ 2/9 Scores  PHQ - 2 Score 0 0 0 0 0 0 0  PHQ- 9 Score 0 0 0         Fall Risk     11/03/2023    9:09 AM 10/18/2023    9:51 AM 04/13/2023   10:15 AM 11/02/2022    9:02 AM 10/14/2022   10:13 AM  Fall Risk   Falls in the past year? 0 0 0 0 0  Number falls in past yr: 0 0 0 0 0  Injury with Fall? 0 0 0 0 0  Risk for fall  due to : No Fall Risks No Fall Risks No Fall Risks No Fall Risks   Follow up Falls evaluation completed;Education provided;Falls prevention discussed Falls evaluation completed Falls evaluation  completed Falls prevention discussed     MEDICARE RISK AT HOME:  Medicare Risk at Home Any stairs in or around the home?: No If so, are there any without handrails?: No Home free of loose throw rugs in walkways, pet beds, electrical cords, etc?: Yes Adequate lighting in your home to reduce risk of falls?: Yes Life alert?: No Use of a cane, walker or w/c?: No Grab bars in the bathroom?: Yes Shower chair or bench in shower?: No Elevated toilet seat or a handicapped toilet?: Yes  TIMED UP AND GO:  Was the test performed?  No  Cognitive Function: 6CIT completed    09/29/2020    9:50 AM  MMSE - Mini Mental State Exam  Not completed: Unable to complete        11/03/2023    9:11 AM 11/02/2022    9:05 AM 10/06/2021    2:32 PM 09/29/2020    9:51 AM  6CIT Screen  What Year? 0 points 0 points 0 points 0 points  What month? 0 points 0 points 0 points 0 points  What time? 0 points 0 points 0 points 0 points  Count back from 20 0 points 0 points 0 points 0 points  Months in reverse 0 points 0 points 0 points 0 points  Repeat phrase 0 points 0 points 2 points 0 points  Total Score 0 points 0 points 2 points 0 points    Immunizations Immunization History  Administered Date(s) Administered   Fluad Quad(high Dose 65+) 10/18/2019, 10/01/2020, 10/07/2021   Fluad Trivalent(High Dose 65+) 10/14/2022   INFLUENZA, HIGH DOSE SEASONAL PF 10/18/2023   Influenza,inj,Quad PF,6+ Mos 11/11/2012, 10/22/2015, 10/30/2016, 12/22/2017, 11/28/2018   MODERNA COVID-19 SARS-COV-2 PEDS BIVALENT BOOSTER 30yr-51yr 10/12/2020   Moderna SARS-COV2 Booster Vaccination 10/29/2023   Moderna Sars-Covid-2 Vaccination 04/20/2019, 05/17/2019, 12/29/2019, 06/03/2020   Pneumococcal Conjugate-13 04/24/2020   Pneumococcal Polysaccharide-23 05/23/2021   Tdap 03/28/2013   Zoster Recombinant(Shingrix) 04/24/2020, 09/18/2020   Zoster, Live 10/22/2015    Screening Tests Health Maintenance  Topic Date Due   DEXA SCAN   05/04/2022   DTaP/Tdap/Td (2 - Td or Tdap) 03/29/2023   Mammogram  12/18/2023   COVID-19 Vaccine (7 - 2025-26 season) 12/24/2023   Medicare Annual Wellness (AWV)  11/02/2024   Colonoscopy  07/08/2033   Pneumococcal Vaccine: 50+ Years  Completed   Influenza Vaccine  Completed   Hepatitis C Screening  Completed   Zoster Vaccines- Shingrix  Completed   Meningococcal B Vaccine  Aged Out    Health Maintenance Health Maintenance Due  Topic Date Due   DEXA SCAN  05/04/2022   DTaP/Tdap/Td (2 - Td or Tdap) 03/29/2023   Mammogram  12/18/2023   Health Maintenance Items Addressed: DEXA ordered  Additional Screening:  Vision Screening: Recommended annual ophthalmology exams for early detection of glaucoma and other disorders of the eye. Would you like a referral to an eye doctor? No    Dental Screening: Recommended annual dental exams for proper oral hygiene  Community Resource Referral / Chronic Care Management: CRR required this visit?  No   CCM required this visit?  No   Plan:    I have personally reviewed and noted the following in the patient's chart:   Medical and social history Use of alcohol, tobacco or illicit drugs  Current medications and  supplements including opioid prescriptions. Patient is not currently taking opioid prescriptions. Functional ability and status Nutritional status Physical activity Advanced directives List of other physicians Hospitalizations, surgeries, and ER visits in previous 12 months Vitals Screenings to include cognitive, depression, and falls Referrals and appointments  In addition, I have reviewed and discussed with patient certain preventive protocols, quality metrics, and best practice recommendations. A written personalized care plan for preventive services as well as general preventive health recommendations were provided to patient.   Jamerion Cabello, CMA   11/03/2023   After Visit Summary: (MyChart) Due to this being a telephonic  visit, the after visit summary with patients personalized plan was offered to patient via MyChart   Notes: Nothing significant to report at this time.

## 2023-11-03 NOTE — Patient Instructions (Signed)
 Betty Dawson,  Thank you for taking the time for your Medicare Wellness Visit. I appreciate your continued commitment to your health goals. Please review the care plan we discussed, and feel free to reach out if I can assist you further.  Medicare recommends these wellness visits once per year to help you and your care team stay ahead of potential health issues. These visits are designed to focus on prevention, allowing your provider to concentrate on managing your acute and chronic conditions during your regular appointments.  Please note that Annual Wellness Visits do not include a physical exam. Some assessments may be limited, especially if the visit was conducted virtually. If needed, we may recommend a separate in-person follow-up with your provider.  Ongoing Care  Seeing your primary care provider every 3 to 6 months helps us  monitor your health and provide consistent, personalized care.   Referrals   Osteoporosis Screening An order was placed for you to have your Osteoporosis Screening. Call the number below to schedule that [facility]  [phone]   Recommended Screenings:  Health Maintenance  Topic Date Due   DEXA scan (bone density measurement)  05/04/2022   DTaP/Tdap/Td vaccine (2 - Td or Tdap) 03/29/2023   Breast Cancer Screening  12/18/2023   COVID-19 Vaccine (7 - 2025-26 season) 12/24/2023   Medicare Annual Wellness Visit  11/02/2024   Colon Cancer Screening  07/08/2033   Pneumococcal Vaccine for age over 52  Completed   Flu Shot  Completed   Hepatitis C Screening  Completed   Zoster (Shingles) Vaccine  Completed   Meningitis B Vaccine  Aged Out       11/03/2023    8:52 AM  Advanced Directives  Does Patient Have a Medical Advance Directive? No  Would patient like information on creating a medical advance directive? No - Patient declined    Advance Care Planning is important because it: Ensures you receive medical care that aligns with your values, goals,  and preferences. Provides guidance to your family and loved ones, reducing the emotional burden of decision-making during critical moments.  Vision: Annual vision screenings are recommended for early detection of glaucoma, cataracts, and diabetic retinopathy. These exams can also reveal signs of chronic conditions such as diabetes and high blood pressure.  Dental: Annual dental screenings help detect early signs of oral cancer, gum disease, and other conditions linked to overall health, including heart disease and diabetes.  Please see the attached documents for additional preventive care recommendations.

## 2023-12-20 ENCOUNTER — Ambulatory Visit: Payer: Self-pay | Admitting: Internal Medicine

## 2023-12-20 ENCOUNTER — Ambulatory Visit (HOSPITAL_COMMUNITY)
Admission: RE | Admit: 2023-12-20 | Discharge: 2023-12-20 | Disposition: A | Source: Ambulatory Visit | Attending: Internal Medicine

## 2023-12-20 ENCOUNTER — Ambulatory Visit (HOSPITAL_COMMUNITY)
Admission: RE | Admit: 2023-12-20 | Discharge: 2023-12-20 | Disposition: A | Discharge status: Home / Self Care | Source: Ambulatory Visit | Attending: Internal Medicine | Admitting: Internal Medicine

## 2023-12-20 DIAGNOSIS — Z1231 Encounter for screening mammogram for malignant neoplasm of breast: Secondary | ICD-10-CM | POA: Diagnosis present

## 2023-12-20 DIAGNOSIS — Z780 Asymptomatic menopausal state: Secondary | ICD-10-CM | POA: Diagnosis present

## 2023-12-29 ENCOUNTER — Encounter (INDEPENDENT_AMBULATORY_CARE_PROVIDER_SITE_OTHER): Payer: Self-pay | Admitting: *Deleted

## 2024-01-03 ENCOUNTER — Telehealth: Payer: Self-pay | Admitting: *Deleted

## 2024-01-03 NOTE — Telephone Encounter (Signed)
  Procedure: colonoscopy  Estimated body mass index is 28.89 kg/m as calculated from the following:   Height as of 11/03/23: 5' 8 (1.727 m).   Weight as of 11/03/23: 190 lb (86.2 kg).   Have you had a colonoscopy before?  Yes, 06/2023, Dr. Cindie  Do you have family history of colon cancer?  no  Do you have a family history of polyps? no  Previous colonoscopy with polyps removed? no  Do you have a history colorectal cancer?   no  Are you diabetic?  no  Do you have a prosthetic or mechanical heart valve? no  Do you have a pacemaker/defibrillator?   no  Have you had endocarditis/atrial fibrillation?  no  Do you use supplemental oxygen/CPAP?  no  Have you had joint replacement within the last 12 months?  no  Do you tend to be constipated or have to use laxatives?  no   Do you have history of alcohol use? If yes, how much and how often.  no  Do you have history or are you using drugs? If yes, what do are you  using?  no  Have you ever had a stroke/heart attack?  no  Have you ever had a heart or other vascular stent placed,?no  Do you take weight loss medication? no  female patients,: have you had a hysterectomy? yes                              are you post menopausal?  no                              do you still have your menstrual cycle? no    Date of last menstrual period?   Do you take any blood-thinning medications such as: (Plavix, aspirin, Coumadin, Aggrenox, Brilinta, Xarelto, Eliquis, Pradaxa, Savaysa or Effient)? no  If yes we need the name, milligram, dosage and who is prescribing doctor:               Current Outpatient Medications  Medication Sig Dispense Refill   acetaminophen  (TYLENOL ) 500 MG tablet Take 1,000 mg by mouth every 6 (six) hours as needed (pain).      albuterol  (VENTOLIN  HFA) 108 (90 Base) MCG/ACT inhaler Inhale 2 puffs into the lungs every 4 (four) hours as needed for wheezing or shortness of breath. 18 g 0   amLODipine  (NORVASC ) 10 MG  tablet Take 1 tablet (10 mg total) by mouth daily. 90 tablet 1   cyclobenzaprine  (FLEXERIL ) 10 MG tablet TAKE 1 TABLET BY MOUTH TWICE A DAY AS NEEDED FOR MUSCLE SPASM 30 tablet 1   gabapentin  (NEURONTIN ) 300 MG capsule Take 1 capsule (300 mg total) by mouth at bedtime. 30 capsule 5   hydrochlorothiazide  (HYDRODIURIL ) 25 MG tablet Take 1 tablet (25 mg total) by mouth daily. 90 tablet 1   No current facility-administered medications for this visit.    Allergies  Allergen Reactions   Lisinopril Swelling

## 2024-01-09 NOTE — Telephone Encounter (Signed)
 ASA 2 - Dr. Cindie  Miralax daily for 3 days prior.

## 2024-01-10 NOTE — Telephone Encounter (Signed)
 Called pt. She wants to book in April. Advised will call once we get that schedule.

## 2024-04-17 ENCOUNTER — Ambulatory Visit: Admitting: Internal Medicine

## 2024-11-06 ENCOUNTER — Ambulatory Visit
